# Patient Record
Sex: Female | Born: 1960 | Race: White | Hispanic: No | Smoking: Never smoker
Health system: Southern US, Community
[De-identification: ages and names within clinical notes are randomized; demographics above are authoritative.]

## PROBLEM LIST (undated history)

## (undated) DIAGNOSIS — M810 Age-related osteoporosis without current pathological fracture: Secondary | ICD-10-CM

## (undated) DIAGNOSIS — R7611 Nonspecific reaction to tuberculin skin test without active tuberculosis: Secondary | ICD-10-CM

## (undated) DIAGNOSIS — I2699 Other pulmonary embolism without acute cor pulmonale: Secondary | ICD-10-CM

## (undated) DIAGNOSIS — M419 Scoliosis, unspecified: Secondary | ICD-10-CM

## (undated) DIAGNOSIS — K219 Gastro-esophageal reflux disease without esophagitis: Secondary | ICD-10-CM

## (undated) DIAGNOSIS — I7300 Raynaud's syndrome without gangrene: Secondary | ICD-10-CM

## (undated) DIAGNOSIS — I10 Essential (primary) hypertension: Secondary | ICD-10-CM

## (undated) DIAGNOSIS — F32A Depression, unspecified: Secondary | ICD-10-CM

## (undated) DIAGNOSIS — R32 Unspecified urinary incontinence: Secondary | ICD-10-CM

## (undated) DIAGNOSIS — Q999 Chromosomal abnormality, unspecified: Secondary | ICD-10-CM

## (undated) DIAGNOSIS — Z8719 Personal history of other diseases of the digestive system: Secondary | ICD-10-CM

## (undated) DIAGNOSIS — T7840XA Allergy, unspecified, initial encounter: Secondary | ICD-10-CM

## (undated) DIAGNOSIS — I82409 Acute embolism and thrombosis of unspecified deep veins of unspecified lower extremity: Secondary | ICD-10-CM

## (undated) DIAGNOSIS — E7212 Methylenetetrahydrofolate reductase deficiency: Secondary | ICD-10-CM

## (undated) DIAGNOSIS — M797 Fibromyalgia: Secondary | ICD-10-CM

## (undated) DIAGNOSIS — G4730 Sleep apnea, unspecified: Secondary | ICD-10-CM

## (undated) DIAGNOSIS — F329 Major depressive disorder, single episode, unspecified: Secondary | ICD-10-CM

## (undated) HISTORY — DX: Nonspecific reaction to tuberculin skin test without active tuberculosis: R76.11

## (undated) HISTORY — DX: Gastro-esophageal reflux disease without esophagitis: K21.9

## (undated) HISTORY — DX: Age-related osteoporosis without current pathological fracture: M81.0

## (undated) HISTORY — DX: Sleep apnea, unspecified: G47.30

## (undated) HISTORY — DX: Methylenetetrahydrofolate reductase deficiency: E72.12

## (undated) HISTORY — DX: Essential (primary) hypertension: I10

## (undated) HISTORY — DX: Allergy, unspecified, initial encounter: T78.40XA

## (undated) HISTORY — DX: Personal history of other diseases of the digestive system: Z87.19

## (undated) HISTORY — DX: Unspecified urinary incontinence: R32

## (undated) HISTORY — DX: Scoliosis, unspecified: M41.9

## (undated) HISTORY — DX: Depression, unspecified: F32.A

## (undated) HISTORY — DX: Raynaud's syndrome without gangrene: I73.00

## (undated) HISTORY — DX: Major depressive disorder, single episode, unspecified: F32.9

---

## 1999-03-04 ENCOUNTER — Encounter (INDEPENDENT_AMBULATORY_CARE_PROVIDER_SITE_OTHER): Payer: Self-pay

## 1999-03-04 ENCOUNTER — Other Ambulatory Visit: Admission: RE | Admit: 1999-03-04 | Discharge: 1999-03-04 | Payer: Self-pay | Admitting: Obstetrics and Gynecology

## 1999-06-17 ENCOUNTER — Other Ambulatory Visit: Admission: RE | Admit: 1999-06-17 | Discharge: 1999-06-17 | Payer: Self-pay | Admitting: Obstetrics and Gynecology

## 1999-12-12 ENCOUNTER — Other Ambulatory Visit: Admission: RE | Admit: 1999-12-12 | Discharge: 1999-12-12 | Payer: Self-pay | Admitting: Obstetrics and Gynecology

## 2000-07-10 ENCOUNTER — Other Ambulatory Visit: Admission: RE | Admit: 2000-07-10 | Discharge: 2000-07-10 | Payer: Self-pay | Admitting: Obstetrics and Gynecology

## 2001-02-01 ENCOUNTER — Other Ambulatory Visit: Admission: RE | Admit: 2001-02-01 | Discharge: 2001-02-01 | Payer: Self-pay | Admitting: Obstetrics and Gynecology

## 2001-02-13 ENCOUNTER — Encounter: Payer: Self-pay | Admitting: Obstetrics and Gynecology

## 2001-02-13 ENCOUNTER — Encounter: Admission: RE | Admit: 2001-02-13 | Discharge: 2001-02-13 | Payer: Self-pay | Admitting: Obstetrics and Gynecology

## 2002-04-24 ENCOUNTER — Other Ambulatory Visit: Admission: RE | Admit: 2002-04-24 | Discharge: 2002-04-24 | Payer: Self-pay | Admitting: Obstetrics and Gynecology

## 2003-06-19 ENCOUNTER — Other Ambulatory Visit: Admission: RE | Admit: 2003-06-19 | Discharge: 2003-06-19 | Payer: Self-pay | Admitting: Obstetrics and Gynecology

## 2004-08-17 ENCOUNTER — Other Ambulatory Visit: Admission: RE | Admit: 2004-08-17 | Discharge: 2004-08-17 | Payer: Self-pay | Admitting: Obstetrics and Gynecology

## 2005-09-07 ENCOUNTER — Other Ambulatory Visit: Admission: RE | Admit: 2005-09-07 | Discharge: 2005-09-07 | Payer: Self-pay | Admitting: Obstetrics & Gynecology

## 2005-11-27 ENCOUNTER — Encounter: Admission: RE | Admit: 2005-11-27 | Discharge: 2006-02-25 | Payer: Self-pay

## 2005-11-28 ENCOUNTER — Ambulatory Visit: Admission: RE | Admit: 2005-11-28 | Discharge: 2005-11-28 | Payer: Self-pay | Admitting: Obstetrics & Gynecology

## 2006-01-11 ENCOUNTER — Encounter: Admission: RE | Admit: 2006-01-11 | Discharge: 2006-01-11 | Payer: Self-pay | Admitting: Obstetrics & Gynecology

## 2006-04-16 ENCOUNTER — Encounter: Admission: RE | Admit: 2006-04-16 | Discharge: 2006-04-16 | Payer: Self-pay | Admitting: Obstetrics & Gynecology

## 2006-10-01 ENCOUNTER — Other Ambulatory Visit: Admission: RE | Admit: 2006-10-01 | Discharge: 2006-10-01 | Payer: Self-pay | Admitting: Obstetrics & Gynecology

## 2007-07-11 ENCOUNTER — Encounter: Admission: RE | Admit: 2007-07-11 | Discharge: 2007-07-11 | Payer: Self-pay | Admitting: Obstetrics & Gynecology

## 2007-10-09 ENCOUNTER — Other Ambulatory Visit: Admission: RE | Admit: 2007-10-09 | Discharge: 2007-10-09 | Payer: Self-pay | Admitting: Obstetrics and Gynecology

## 2008-07-09 ENCOUNTER — Encounter: Admission: RE | Admit: 2008-07-09 | Discharge: 2008-07-09 | Payer: Self-pay | Admitting: Family Medicine

## 2010-03-14 ENCOUNTER — Encounter: Admission: RE | Admit: 2010-03-14 | Discharge: 2010-03-14 | Payer: Self-pay | Admitting: Obstetrics & Gynecology

## 2011-11-08 ENCOUNTER — Other Ambulatory Visit (HOSPITAL_COMMUNITY): Payer: Self-pay | Admitting: Nurse Practitioner

## 2011-11-08 DIAGNOSIS — Z1231 Encounter for screening mammogram for malignant neoplasm of breast: Secondary | ICD-10-CM

## 2011-12-01 ENCOUNTER — Ambulatory Visit (HOSPITAL_COMMUNITY)
Admission: RE | Admit: 2011-12-01 | Discharge: 2011-12-01 | Disposition: A | Discharge status: Home / Self Care | Payer: PRIVATE HEALTH INSURANCE | Source: Ambulatory Visit | Attending: Nurse Practitioner | Admitting: Nurse Practitioner

## 2011-12-01 DIAGNOSIS — Z1231 Encounter for screening mammogram for malignant neoplasm of breast: Secondary | ICD-10-CM | POA: Insufficient documentation

## 2013-03-27 ENCOUNTER — Other Ambulatory Visit (HOSPITAL_COMMUNITY): Payer: Self-pay | Admitting: Internal Medicine

## 2013-03-27 DIAGNOSIS — Z1231 Encounter for screening mammogram for malignant neoplasm of breast: Secondary | ICD-10-CM

## 2013-03-28 ENCOUNTER — Ambulatory Visit (HOSPITAL_COMMUNITY)
Admission: RE | Admit: 2013-03-28 | Discharge: 2013-03-28 | Disposition: A | Discharge status: Home / Self Care | Payer: PRIVATE HEALTH INSURANCE | Source: Ambulatory Visit | Attending: Internal Medicine | Admitting: Internal Medicine

## 2013-03-28 DIAGNOSIS — Z1231 Encounter for screening mammogram for malignant neoplasm of breast: Secondary | ICD-10-CM | POA: Insufficient documentation

## 2014-04-24 ENCOUNTER — Ambulatory Visit (INDEPENDENT_AMBULATORY_CARE_PROVIDER_SITE_OTHER): Payer: PRIVATE HEALTH INSURANCE | Admitting: Podiatry

## 2014-04-24 ENCOUNTER — Encounter: Payer: Self-pay | Admitting: Podiatry

## 2014-04-24 VITALS — BP 129/87 | HR 81 | Ht 66.0 in | Wt 150.0 lb

## 2014-04-24 DIAGNOSIS — M7742 Metatarsalgia, left foot: Secondary | ICD-10-CM

## 2014-04-24 DIAGNOSIS — G588 Other specified mononeuropathies: Secondary | ICD-10-CM

## 2014-04-24 DIAGNOSIS — M7741 Metatarsalgia, right foot: Secondary | ICD-10-CM

## 2014-04-24 DIAGNOSIS — M21969 Unspecified acquired deformity of unspecified lower leg: Secondary | ICD-10-CM | POA: Insufficient documentation

## 2014-04-24 DIAGNOSIS — G5731 Lesion of lateral popliteal nerve, right lower limb: Secondary | ICD-10-CM

## 2014-04-24 NOTE — Patient Instructions (Signed)
Seen for pain on both feet. Both feet casted for orthotics. Metatarsal binder dispensed.

## 2014-04-24 NOTE — Progress Notes (Signed)
Subjective: 53 year old female presents complaining of bilateral foot pain that started at dorsal aspect between the first and 2nd digit and extended proximally to first intermetatarsal space right foot 3-4 years ago.  Seen a podiatrist who suggested a series of injections without assurance of success.  Then started to have pain at the first MPJ and IPJ about 2 years ago as well as on the ball of the foot. She saw chiropractor for this problem and got some improvement. Also got orthotics and metatarsal support.  Now pain at the IM space at least couple of times a day, 25-50% of time pain at the IPJ and MPJ. Pain at the ball of the foot is constant.  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   Review of Systems - General ROS: negative for - chills, fatigue, fever or hot flashes Ophthalmic ROS: negative ENT ROS: negative Allergy and Immunology ROS: negative Hematological and Lymphatic ROS: negative Endocrine ROS: negative Breast ROS: negative for breast lumps Respiratory ROS: no cough, shortness of breath, or wheezing Cardiovascular ROS: no chest pain or dyspnea on exertion Gastrointestinal ROS: no abdominal pain, change in bowel habits, or black or bloody stools Genito-Urinary ROS: no dysuria, trouble voiding, or hematuria Musculoskeletal ROS: Gets pain in neck, back, and knee joints.  Neurological ROS: no TIA or stroke symptoms Dermatological ROS: negative   Objective: Dermatologic: Normal skin and nails without abnormal lesions.  Vascular: All pedal pulses are palpable bilateral. No edema or erythema noted. Orthopedic: Positive of excess sagittal plane motion of the first Metatarsal. Enlarged first Metatarsal bone bilateral. Neurologic: All epicritic and tactile sensations grossly intact. Radiographic examination reveal significant elevation of the first Metatarsal bone bilateral. Mild narrowing of joint space at the first MPJ right foot.  Rearfoot and mid foot joints are within  normal.   Assessment: Neuralgia sensory branch of deep peroneal nerve right, possible post injury.  Hypermobile first ray bilateral with weight shifting to lateral column. Osteoarthropathy first MPJ and IPJ bilateral. Lesser metatarsalgia bilateral.  Plan: Reviewed findings and available options. Orthotics inspected. Metatarsal binder dispensed.  Patient wants to try out new pair of orthotics. Both feet casted for orthotics.

## 2014-10-08 ENCOUNTER — Other Ambulatory Visit: Payer: Self-pay | Admitting: Internal Medicine

## 2014-10-08 DIAGNOSIS — N644 Mastodynia: Secondary | ICD-10-CM

## 2014-10-12 ENCOUNTER — Ambulatory Visit
Admission: RE | Admit: 2014-10-12 | Discharge: 2014-10-12 | Disposition: A | Discharge status: Home / Self Care | Payer: PRIVATE HEALTH INSURANCE | Source: Ambulatory Visit | Attending: Internal Medicine | Admitting: Internal Medicine

## 2014-10-12 DIAGNOSIS — N644 Mastodynia: Secondary | ICD-10-CM

## 2014-10-14 ENCOUNTER — Other Ambulatory Visit: Payer: Self-pay | Admitting: Internal Medicine

## 2015-04-16 ENCOUNTER — Other Ambulatory Visit: Payer: Self-pay | Admitting: Chiropractic Medicine

## 2015-04-16 ENCOUNTER — Ambulatory Visit
Admission: RE | Admit: 2015-04-16 | Discharge: 2015-04-16 | Disposition: A | Discharge status: Home / Self Care | Payer: PRIVATE HEALTH INSURANCE | Source: Ambulatory Visit | Attending: Chiropractic Medicine | Admitting: Chiropractic Medicine

## 2015-04-16 DIAGNOSIS — M5385 Other specified dorsopathies, thoracolumbar region: Secondary | ICD-10-CM

## 2015-10-27 ENCOUNTER — Other Ambulatory Visit: Payer: Self-pay | Admitting: Internal Medicine

## 2015-10-27 DIAGNOSIS — M545 Low back pain: Secondary | ICD-10-CM

## 2015-11-04 ENCOUNTER — Other Ambulatory Visit: Payer: PRIVATE HEALTH INSURANCE

## 2015-11-09 ENCOUNTER — Ambulatory Visit
Admission: RE | Admit: 2015-11-09 | Discharge: 2015-11-09 | Disposition: A | Discharge status: Home / Self Care | Payer: PRIVATE HEALTH INSURANCE | Source: Ambulatory Visit | Attending: Internal Medicine | Admitting: Internal Medicine

## 2015-11-09 DIAGNOSIS — M545 Low back pain: Secondary | ICD-10-CM

## 2016-10-16 ENCOUNTER — Other Ambulatory Visit: Payer: Self-pay | Admitting: Neurological Surgery

## 2016-10-16 DIAGNOSIS — G8929 Other chronic pain: Secondary | ICD-10-CM

## 2016-10-16 DIAGNOSIS — M546 Pain in thoracic spine: Principal | ICD-10-CM

## 2016-10-16 DIAGNOSIS — M542 Cervicalgia: Secondary | ICD-10-CM

## 2016-10-21 ENCOUNTER — Ambulatory Visit
Admission: RE | Admit: 2016-10-21 | Discharge: 2016-10-21 | Disposition: A | Discharge status: Home / Self Care | Payer: PRIVATE HEALTH INSURANCE | Source: Ambulatory Visit | Attending: Neurological Surgery | Admitting: Neurological Surgery

## 2016-10-21 DIAGNOSIS — M542 Cervicalgia: Secondary | ICD-10-CM

## 2016-10-21 DIAGNOSIS — M546 Pain in thoracic spine: Principal | ICD-10-CM

## 2016-10-21 DIAGNOSIS — G8929 Other chronic pain: Secondary | ICD-10-CM

## 2016-10-26 ENCOUNTER — Emergency Department (HOSPITAL_COMMUNITY): Payer: PRIVATE HEALTH INSURANCE

## 2016-10-26 ENCOUNTER — Inpatient Hospital Stay (HOSPITAL_COMMUNITY): Payer: PRIVATE HEALTH INSURANCE

## 2016-10-26 ENCOUNTER — Encounter (HOSPITAL_COMMUNITY): Payer: Self-pay

## 2016-10-26 ENCOUNTER — Inpatient Hospital Stay (HOSPITAL_COMMUNITY)
Admission: EM | Admit: 2016-10-26 | Discharge: 2016-10-28 | DRG: 175 | Disposition: A | Discharge status: Home / Self Care | Payer: PRIVATE HEALTH INSURANCE | Attending: Internal Medicine | Admitting: Internal Medicine

## 2016-10-26 DIAGNOSIS — I2609 Other pulmonary embolism with acute cor pulmonale: Secondary | ICD-10-CM | POA: Diagnosis present

## 2016-10-26 DIAGNOSIS — F410 Panic disorder [episodic paroxysmal anxiety] without agoraphobia: Secondary | ICD-10-CM | POA: Diagnosis present

## 2016-10-26 DIAGNOSIS — I10 Essential (primary) hypertension: Secondary | ICD-10-CM | POA: Diagnosis present

## 2016-10-26 DIAGNOSIS — G8929 Other chronic pain: Secondary | ICD-10-CM | POA: Diagnosis present

## 2016-10-26 DIAGNOSIS — Z91040 Latex allergy status: Secondary | ICD-10-CM | POA: Diagnosis not present

## 2016-10-26 DIAGNOSIS — Z79899 Other long term (current) drug therapy: Secondary | ICD-10-CM | POA: Diagnosis not present

## 2016-10-26 DIAGNOSIS — I2699 Other pulmonary embolism without acute cor pulmonale: Secondary | ICD-10-CM | POA: Diagnosis not present

## 2016-10-26 DIAGNOSIS — R0902 Hypoxemia: Secondary | ICD-10-CM | POA: Diagnosis present

## 2016-10-26 DIAGNOSIS — M549 Dorsalgia, unspecified: Secondary | ICD-10-CM | POA: Diagnosis present

## 2016-10-26 DIAGNOSIS — M5440 Lumbago with sciatica, unspecified side: Secondary | ICD-10-CM | POA: Diagnosis not present

## 2016-10-26 DIAGNOSIS — K219 Gastro-esophageal reflux disease without esophagitis: Secondary | ICD-10-CM | POA: Diagnosis present

## 2016-10-26 DIAGNOSIS — R609 Edema, unspecified: Secondary | ICD-10-CM | POA: Diagnosis not present

## 2016-10-26 DIAGNOSIS — Z8249 Family history of ischemic heart disease and other diseases of the circulatory system: Secondary | ICD-10-CM

## 2016-10-26 DIAGNOSIS — Z7952 Long term (current) use of systemic steroids: Secondary | ICD-10-CM | POA: Diagnosis not present

## 2016-10-26 DIAGNOSIS — E559 Vitamin D deficiency, unspecified: Secondary | ICD-10-CM | POA: Diagnosis present

## 2016-10-26 DIAGNOSIS — F419 Anxiety disorder, unspecified: Secondary | ICD-10-CM | POA: Diagnosis not present

## 2016-10-26 DIAGNOSIS — I7300 Raynaud's syndrome without gangrene: Secondary | ICD-10-CM | POA: Diagnosis present

## 2016-10-26 DIAGNOSIS — M797 Fibromyalgia: Secondary | ICD-10-CM | POA: Diagnosis present

## 2016-10-26 DIAGNOSIS — E7212 Methylenetetrahydrofolate reductase deficiency: Secondary | ICD-10-CM | POA: Diagnosis present

## 2016-10-26 DIAGNOSIS — Z888 Allergy status to other drugs, medicaments and biological substances status: Secondary | ICD-10-CM

## 2016-10-26 DIAGNOSIS — F329 Major depressive disorder, single episode, unspecified: Secondary | ICD-10-CM | POA: Diagnosis present

## 2016-10-26 DIAGNOSIS — I5030 Unspecified diastolic (congestive) heart failure: Secondary | ICD-10-CM | POA: Diagnosis not present

## 2016-10-26 DIAGNOSIS — Z91030 Bee allergy status: Secondary | ICD-10-CM

## 2016-10-26 DIAGNOSIS — Z880 Allergy status to penicillin: Secondary | ICD-10-CM | POA: Diagnosis not present

## 2016-10-26 HISTORY — DX: Chromosomal abnormality, unspecified: Q99.9

## 2016-10-26 HISTORY — DX: Fibromyalgia: M79.7

## 2016-10-26 LAB — BASIC METABOLIC PANEL
ANION GAP: 11 (ref 5–15)
BUN: 17 mg/dL (ref 6–20)
CHLORIDE: 101 mmol/L (ref 101–111)
CO2: 24 mmol/L (ref 22–32)
Calcium: 9.4 mg/dL (ref 8.9–10.3)
Creatinine, Ser: 0.86 mg/dL (ref 0.44–1.00)
GFR calc Af Amer: >60 mL/min (ref >60)
GFR calc non Af Amer: >60 mL/min (ref >60)
Glucose, Bld: 133 mg/dL — ABNORMAL HIGH (ref 65–99)
POTASSIUM: 4.2 mmol/L (ref 3.5–5.1)
Sodium: 136 mmol/L (ref 135–145)

## 2016-10-26 LAB — CBC WITH DIFFERENTIAL/PLATELET
Basophils Absolute: 0 10*3/uL (ref 0.0–0.1)
Basophils Relative: 0 %
Eosinophils Absolute: 0 10*3/uL (ref 0.0–0.7)
Eosinophils Relative: 0 %
HEMATOCRIT: 37.8 % (ref 36.0–46.0)
HEMOGLOBIN: 13 g/dL (ref 12.0–15.0)
LYMPHS ABS: 1.6 10*3/uL (ref 0.7–4.0)
LYMPHS PCT: 10 %
MCH: 33.2 pg (ref 26.0–34.0)
MCHC: 34.4 g/dL (ref 30.0–36.0)
MCV: 96.4 fL (ref 78.0–100.0)
MONOS PCT: 5 %
Monocytes Absolute: 0.7 10*3/uL (ref 0.1–1.0)
NEUTROS ABS: 12.8 10*3/uL — AB (ref 1.7–7.7)
NEUTROS PCT: 85 %
Platelets: 223 10*3/uL (ref 150–400)
RBC: 3.92 MIL/uL (ref 3.87–5.11)
RDW: 14.3 % (ref 11.5–15.5)
WBC: 15.2 10*3/uL — ABNORMAL HIGH (ref 4.0–10.5)

## 2016-10-26 LAB — I-STAT TROPONIN, ED: Troponin i, poc: 0.17 ng/mL (ref 0.00–0.08)

## 2016-10-26 LAB — URINALYSIS, ROUTINE W REFLEX MICROSCOPIC
Bilirubin Urine: NEGATIVE
Glucose, UA: NEGATIVE mg/dL
HGB URINE DIPSTICK: NEGATIVE
Ketones, ur: NEGATIVE mg/dL
LEUKOCYTES UA: NEGATIVE
Nitrite: NEGATIVE
PROTEIN: NEGATIVE mg/dL
SPECIFIC GRAVITY, URINE: 1.005 (ref 1.005–1.030)
pH: 7 (ref 5.0–8.0)

## 2016-10-26 LAB — RAPID URINE DRUG SCREEN, HOSP PERFORMED
AMPHETAMINES: NOT DETECTED
BENZODIAZEPINES: NOT DETECTED
Barbiturates: NOT DETECTED
Cocaine: NOT DETECTED
OPIATES: NOT DETECTED
TETRAHYDROCANNABINOL: NOT DETECTED

## 2016-10-26 LAB — HEPARIN LEVEL (UNFRACTIONATED): Heparin Unfractionated: 1 IU/mL — ABNORMAL HIGH (ref 0.30–0.70)

## 2016-10-26 LAB — PROTIME-INR
INR: 0.94
Prothrombin Time: 12.5 seconds (ref 11.4–15.2)

## 2016-10-26 LAB — TSH: TSH: 2.276 u[IU]/mL (ref 0.350–4.500)

## 2016-10-26 LAB — D-DIMER, QUANTITATIVE: D-Dimer, Quant: 5.97 ug/mL-FEU — ABNORMAL HIGH (ref 0.00–0.50)

## 2016-10-26 LAB — APTT: APTT: 24 s (ref 24–36)

## 2016-10-26 LAB — ECHOCARDIOGRAM COMPLETE
HEIGHTINCHES: 66 in
Weight: 2000 oz

## 2016-10-26 LAB — CK: Total CK: 59 U/L (ref 38–234)

## 2016-10-26 MED ORDER — CLONAZEPAM 0.5 MG PO TABS
0.5000 mg | ORAL_TABLET | Freq: Every day | ORAL | Status: DC
  Administered 2016-10-26 – 2016-10-27 (×2): 0.5 mg via ORAL
  Filled 2016-10-26 (×2): qty 1

## 2016-10-26 MED ORDER — ACETAMINOPHEN 650 MG RE SUPP: 650.0000 mg | Freq: Four times a day (QID) | RECTAL | Status: DC | PRN

## 2016-10-26 MED ORDER — IOPAMIDOL (ISOVUE-370) INJECTION 76%
INTRAVENOUS | Status: AC
  Administered 2016-10-26: 100 mL via INTRAVENOUS
  Filled 2016-10-26: qty 100

## 2016-10-26 MED ORDER — L-METHYLFOLATE 15 MG PO TABS: 1.0000 | ORAL_TABLET | Freq: Every day | ORAL | Status: DC

## 2016-10-26 MED ORDER — ACETAMINOPHEN 325 MG PO TABS
650.0000 mg | ORAL_TABLET | Freq: Four times a day (QID) | ORAL | Status: DC | PRN
Start: 2016-10-26 — End: 2016-10-28
  Administered 2016-10-27 – 2016-10-28 (×3): 650 mg via ORAL
  Filled 2016-10-26 (×3): qty 2

## 2016-10-26 MED ORDER — ONDANSETRON HCL 4 MG/2ML IJ SOLN: 4.0000 mg | Freq: Four times a day (QID) | INTRAMUSCULAR | Status: DC | PRN

## 2016-10-26 MED ORDER — FAMOTIDINE 20 MG PO TABS
20.0000 mg | ORAL_TABLET | Freq: Every day | ORAL | Status: DC
  Administered 2016-10-27 – 2016-10-28 (×2): 20 mg via ORAL
  Filled 2016-10-26 (×2): qty 1

## 2016-10-26 MED ORDER — VITAMIN D3 25 MCG (1000 UNIT) PO TABS
5000.0000 [IU] | ORAL_TABLET | Freq: Every day | ORAL | Status: DC
  Administered 2016-10-27 – 2016-10-28 (×2): 5000 [IU] via ORAL
  Filled 2016-10-26 (×2): qty 5

## 2016-10-26 MED ORDER — HYDRALAZINE HCL 20 MG/ML IJ SOLN
5.0000 mg | INTRAMUSCULAR | Status: DC | PRN
  Administered 2016-10-26: 5 mg via INTRAVENOUS
  Filled 2016-10-26: qty 1

## 2016-10-26 MED ORDER — VITAMIN C 500 MG PO TABS
500.0000 mg | ORAL_TABLET | Freq: Every day | ORAL | Status: DC
  Administered 2016-10-27 – 2016-10-28 (×2): 500 mg via ORAL
  Filled 2016-10-26 (×2): qty 1

## 2016-10-26 MED ORDER — HEPARIN BOLUS VIA INFUSION
3500.0000 [IU] | Freq: Once | INTRAVENOUS | Status: AC
  Administered 2016-10-26: 3500 [IU] via INTRAVENOUS
  Filled 2016-10-26: qty 3500

## 2016-10-26 MED ORDER — IOPAMIDOL (ISOVUE-370) INJECTION 76%
100.0000 mL | Freq: Once | INTRAVENOUS | Status: AC | PRN
  Administered 2016-10-26: 100 mL via INTRAVENOUS

## 2016-10-26 MED ORDER — BUSPIRONE HCL 5 MG PO TABS
30.0000 mg | ORAL_TABLET | Freq: Two times a day (BID) | ORAL | Status: DC
  Administered 2016-10-26 – 2016-10-28 (×4): 30 mg via ORAL
  Filled 2016-10-26 (×4): qty 6

## 2016-10-26 MED ORDER — HEPARIN (PORCINE) IN NACL 100-0.45 UNIT/ML-% IJ SOLN: 800.0000 [IU]/h | INTRAMUSCULAR | Status: AC

## 2016-10-26 MED ORDER — SODIUM CHLORIDE 0.9 % IV SOLN: 250.0000 mL | INTRAVENOUS | Status: DC | PRN

## 2016-10-26 MED ORDER — SODIUM CHLORIDE 0.9% FLUSH
3.0000 mL | Freq: Two times a day (BID) | INTRAVENOUS | Status: DC
  Administered 2016-10-26 – 2016-10-27 (×3): 3 mL via INTRAVENOUS

## 2016-10-26 MED ORDER — L-METHYLFOLATE-B6-B12 3-35-2 MG PO TABS
1.0000 | ORAL_TABLET | Freq: Every day | ORAL | Status: DC
  Administered 2016-10-27 – 2016-10-28 (×2): 1 via ORAL
  Filled 2016-10-26 (×2): qty 1

## 2016-10-26 MED ORDER — METHOCARBAMOL 500 MG PO TABS
1000.0000 mg | ORAL_TABLET | Freq: Every day | ORAL | Status: DC
  Filled 2016-10-26: qty 2

## 2016-10-26 MED ORDER — TRAMADOL HCL 50 MG PO TABS
50.0000 mg | ORAL_TABLET | Freq: Four times a day (QID) | ORAL | Status: DC | PRN
  Administered 2016-10-26 – 2016-10-27 (×2): 50 mg via ORAL
  Filled 2016-10-26 (×2): qty 1

## 2016-10-26 MED ORDER — ALBUTEROL SULFATE (2.5 MG/3ML) 0.083% IN NEBU: 2.5000 mg | INHALATION_SOLUTION | RESPIRATORY_TRACT | Status: DC | PRN

## 2016-10-26 MED ORDER — SODIUM CHLORIDE 0.9% FLUSH: 3.0000 mL | INTRAVENOUS | Status: DC | PRN

## 2016-10-26 MED ORDER — HEPARIN (PORCINE) IN NACL 100-0.45 UNIT/ML-% IJ SOLN
950.0000 [IU]/h | INTRAMUSCULAR | Status: DC
  Administered 2016-10-26: 950 [IU]/h via INTRAVENOUS
  Filled 2016-10-26: qty 250

## 2016-10-26 MED ORDER — ONDANSETRON HCL 4 MG PO TABS: 4.0000 mg | ORAL_TABLET | Freq: Four times a day (QID) | ORAL | Status: DC | PRN

## 2016-10-26 MED ORDER — SODIUM CHLORIDE 0.9% FLUSH: 3.0000 mL | Freq: Two times a day (BID) | INTRAVENOUS | Status: DC

## 2016-10-26 MED ORDER — TRAZODONE HCL 100 MG PO TABS
100.0000 mg | ORAL_TABLET | Freq: Every evening | ORAL | Status: DC | PRN
  Administered 2016-10-27: 100 mg via ORAL
  Filled 2016-10-26: qty 1

## 2016-10-26 MED ORDER — VORTIOXETINE HBR 10 MG PO TABS
1.0000 | ORAL_TABLET | Freq: Every day | ORAL | Status: DC
  Administered 2016-10-27: 10 mg via ORAL
  Filled 2016-10-26 (×2): qty 1

## 2016-10-26 MED ORDER — PREDNISONE 20 MG PO TABS
40.0000 mg | ORAL_TABLET | Freq: Every day | ORAL | Status: DC
Start: 2016-10-27 — End: 2016-10-28
  Administered 2016-10-27 – 2016-10-28 (×2): 40 mg via ORAL
  Filled 2016-10-26 (×2): qty 2

## 2016-10-26 NOTE — Progress Notes (Signed)
  Echocardiogram 2D Echocardiogram has been performed.  Arvil ChacoFoster, Theran Vandergrift 10/26/2016, 3:33 PM

## 2016-10-26 NOTE — Progress Notes (Signed)
ANTICOAGULATION CONSULT NOTE - Follow Up Consult  Pharmacy Consult for Heparin Indication: pulmonary embolus and DVT  Allergies  Allergen Reactions  . Bee Venom   . Citalopram Tinitus  . Duloxetine Other (See Comments)  . Escitalopram Oxalate Tinitus  . Latex   . Penicillins Swelling and Rash    Has patient had a PCN reaction causing immediate rash, facial/tongue/throat swelling, SOB or lightheadedness with hypotension: Yes Has patient had a PCN reaction causing severe rash involving mucus membranes or skin necrosis: No Has patient had a PCN reaction that required hospitalization: No Has patient had a PCN reaction occurring within the last 10 years: No If all of the above answers are "NO", then may proceed with Cephalosporin use.    Patient Measurements: Height: 5\' 6"  (167.6 cm) Weight: 125 lb (56.7 kg) IBW/kg (Calculated) : 59.3 Heparin Dosing Weight: actual weight, 56.7 kg  Vital Signs: Temp: 98.2 F (36.8 C) (05/24 1214) Temp Source: Oral (05/24 1214) BP: 133/89 (05/24 1214) Pulse Rate: 106 (05/24 1214)  Labs:  Recent Labs  10/26/16 0745 10/26/16 1232  HGB 13.0  --   HCT 37.8  --   PLT 223  --   APTT 24  --   LABPROT 12.5  --   INR 0.94  --   CREATININE 0.86  --   CKTOTAL  --  59    Estimated Creatinine Clearance: 67.7 mL/min (by C-G formula based on SCr of 0.86 mg/dL).   Medications:  Infusions:  . sodium chloride    . heparin 950 Units/hr (10/26/16 1053)    Assessment: [age] yoF presented to ED on 5/24 with worsening exertional dyspnea.  Family history of thrombotic events in mother, father, and brother.  She denies recent travel, immobilization, surgery, or hormonal therapy.  CT showed submassive PE and venous duplex is positive for right peroneal DVT and left posterior tibial DVT.  No intervention planned at this time.  Pharmacy is consulted to dose Heparin IV.  First Heparin level = 1, supratherapeutic Currently Heparin is infusing via right  antecubital peripheral IV and heparin level was drawn from left side. No bleeding/complications reported.   Goal of Therapy:  Heparin level 0.3-0.7 units/ml Monitor platelets by anticoagulation protocol: Yes   Plan:  Hold heparin x1 hour Decrease to heparin IV infusion at 800 units/hr  Heparin level 6 hours after rate change. Daily heparin level and CBC Continue to monitor H&H and platelets   Lynann Beaverhristine Unnamed Hino PharmD, BCPS Pager [phone] 10/26/2016 6:16 PM

## 2016-10-26 NOTE — H&P (Addendum)
HISTORY AND PHYSICAL       PATIENT DETAILS Name: Faith Thomas Age: 56 y.o. Sex: female Date of Birth: 01-16-1961 Admit Date: 10/26/2016 ZOX:WRUEAVW, Cala Bradford, MD   Patient coming from: Home   CHIEF COMPLAINT:  Worsening exertional dyspnea  HPI: Faith Thomas is a 56 y.o. female with medical history significant of fibromyalgia, depression/anxiety, Raynaud's phenomenon presented to the hospital for evaluation of worsening exertional dyspnea that started approximately 3-4 days back. Per patient, she noticed that she was getting tired very easily for the past week or so, but for the past 3-4 days she has had exertional shortness of breath upon walking. Over the past day or so, even doing simple household chores make her short of breath. She had some jaw pain this morning, as a result she presented to the ED for further evaluation, a CT scan of the chest showed submassive pulmonary embolism. The hospitalist service was then asked to admit this patient for further evaluation and treatment  Per patient, there is no recent history of air travel, prolonged immobilization or surgery. She denies taking any hormonal supplements, however she is on numerous over-the-counter vitamins and other supplementations. She does not have a prior history of venous thromboembolism, but claims that her father, mother and brother have had numerous venous thromboembolism in the past.  He denies any chest pain to me, there is no history of nausea, vomiting or diarrhea. There is no history of fever.   ED Course:  CT angiogram chest positive for submassive PE-case was discussed with Dr. Marchelle Gearing over the phone, who recommended [facility] admission for anticoagulation. Patient remained hemodynamically stable and is not even on oxygen.  Note: Lives at: Home Mobility: Independent Chronic Indwelling Foley:no   REVIEW OF SYSTEMS:  Constitutional:   No  weight loss, night sweats,  Fevers,  chills, fatigue.  HEENT:    No headaches, Dysphagia,Tooth/dental problems,Sore throat  Cardio-vascular: No chest pain,Orthopnea, PND,lower extremity edema, anasarca, palpitations  GI:  No heartburn, indigestion, abdominal pain, nausea, vomiting, diarrhea, melena or hematochezia  Resp: No  cough, hemoptysis,plueritic chest pain.   Skin:  No rash or lesions.  GU:  No dysuria, change in color of urine, no urgency or frequency.  No flank pain.  Musculoskeletal: No joint pain or swelling.  No decreased range of motion.  No back pain.  Endocrine: No heat intolerance, no cold intolerance, no polyuria, no polydipsia  Psych: No change in mood or affect. No depression or anxiety.  No memory loss.   ALLERGIES:   Allergies  Allergen Reactions  . Bee Venom   . Citalopram Tinitus  . Duloxetine Other (See Comments)  . Escitalopram Oxalate Tinitus  . Latex   . Penicillins Swelling and Rash    Has patient had a PCN reaction causing immediate rash, facial/tongue/throat swelling, SOB or lightheadedness with hypotension: Yes Has patient had a PCN reaction causing severe rash involving mucus membranes or skin necrosis: No Has patient had a PCN reaction that required hospitalization: No Has patient had a PCN reaction occurring within the last 10 years: No If all of the above answers are "NO", then may proceed with Cephalosporin use.    PAST MEDICAL HISTORY: Past Medical History:  Diagnosis Date  . Fibromyalgia   . Genetic defect     PAST SURGICAL HISTORY: History reviewed. No pertinent surgical history.  MEDICATIONS AT HOME: Prior to Admission medications   Medication Sig Start Date End Date Taking? Authorizing Provider  ALPRAZolam (XANAX) 0.5 MG tablet Take 0.5 mg by mouth at bedtime as needed for anxiety.   Yes [provider]  b complex vitamins tablet Take 1 tablet by mouth daily.   Yes [provider]  busPIRone (BUSPAR) 30 MG tablet Take 30 mg by mouth  2 (two) times daily.   Yes [provider]  Cholecalciferol (VITAMIN D-3) 5000 UNITS TABS Take by mouth daily.   Yes [provider]  Chromium 400 MCG TABS Take 400 mcg by mouth 2 (two) times daily.   Yes [provider]  clonazePAM (KLONOPIN) 0.5 MG tablet Take 0.5 mg by mouth at bedtime.   Yes [provider]  erythromycin ophthalmic ointment Place 1 application into both eyes 3 (three) times daily. 09/07/16  Yes [provider]  L-Methylfolate 15 MG TABS Take 1 tablet by mouth daily. 10/23/16  Yes [provider]  L-Theanine 100 MG CAPS Take 200 mg by mouth at bedtime.   Yes [provider]  LACTOBACILLUS BIFIDUS PO Take 1 capsule by mouth 3 (three) times a week.   Yes [provider]  lisinopril (PRINIVIL,ZESTRIL) 5 MG tablet Take 2.5 mg by mouth daily.    Yes [provider]  methocarbamol (ROBAXIN) 500 MG tablet Take 1,000 mg by mouth daily. 09/01/16  Yes [provider]  naproxen sodium (ANAPROX) 220 MG tablet Take 440-660 mg by mouth 2 (two) times daily with a meal.   Yes [provider]  Omega 3 1200 MG CAPS Take 1,200 mg by mouth daily.   Yes [provider]  predniSONE (DELTASONE) 20 MG tablet Take 20 mg by mouth 2 (two) times daily with a meal.   Yes [provider]  ranitidine (ZANTAC) 150 MG tablet Take 150 mg by mouth daily. 09/29/16  Yes [provider]  S-Adenosylmethionine (SAM-E) 400 MG TABS Take 400 mg by mouth daily.   Yes [provider]  traMADol (ULTRAM) 50 MG tablet Take 50 mg by mouth 4 (four) times daily as needed for pain. 10/08/16  Yes [provider]  traZODone (DESYREL) 50 MG tablet Take 100 mg by mouth at bedtime as needed for sleep. 10/19/16  Yes [provider]  Turmeric (CURCUMIN 95 PO) Take 1,200 mg by mouth at bedtime.   Yes [provider]  vitamin C (ASCORBIC ACID) 500 MG tablet Take 500 mg by mouth daily.    Yes [provider]  vortioxetine HBr (TRINTELLIX) 10 MG TABS Take 1 tablet by mouth daily.   Yes [provider]    FAMILY HISTORY: Father/mother/brother-venous thromboembolism  SOCIAL HISTORY:  reports that she has never smoked. She has never used smokeless tobacco. She reports that she does not drink alcohol or use drugs.  PHYSICAL EXAM: Blood pressure (!) 130/102, pulse 93, temperature 97.9 F (36.6 C), temperature source Oral, resp. rate 15, height 5\' 6"  (1.676 m), weight 56.7 kg (125 lb), SpO2 93 %.  General appearance :Awake, alert, not in any distress. Speech Clear. Not toxic Looking Eyes:, pupils equally reactive to light and accomodation,no scleral icterus.Pink conjunctiva HEENT: Atraumatic and Normocephalic Neck: supple, no JVD. No cervical lymphadenopathy.  Resp:Good air entry bilaterally, no added sounds  CVS: S1 S2 regular, no murmurs.  GI: Bowel sounds present, Non tender and not distended with no gaurding, rigidity or rebound.No organomegaly Extremities: B/L Lower Ext shows no edema, both legs are warm to touch Neurology:  speech clear,Non focal, sensation is grossly intact.Deep tendon reflexes are preserved Psychiatric:  Normal judgment and insight. Alert and oriented x 3. Normal mood. Musculoskeletal:No digital cyanosis Skin:No Rash, warm and dry Wounds:N/A  LABS ON ADMISSION:  I have personally reviewed following labs and imaging studies  CBC:  Recent Labs Lab 10/26/16 0745  WBC 15.2*  NEUTROABS 12.8*  HGB 13.0  HCT 37.8  MCV 96.4  PLT 223    Basic Metabolic Panel:  Recent Labs Lab 10/26/16 0745  NA 136  K 4.2  CL 101  CO2 24  GLUCOSE 133*  BUN 17  CREATININE 0.86  CALCIUM 9.4    GFR: Estimated Creatinine Clearance: 67.7 mL/min (by C-G formula based on SCr of 0.86 mg/dL).  Liver Function Tests: No results for input(s): AST, ALT, ALKPHOS, BILITOT, PROT, ALBUMIN in the last 168 hours. No results for input(s): LIPASE,  AMYLASE in the last 168 hours. No results for input(s): AMMONIA in the last 168 hours.  Coagulation Profile:  Recent Labs Lab 10/26/16 0745  INR 0.94    Cardiac Enzymes: No results for input(s): CKTOTAL, CKMB, CKMBINDEX, TROPONINI in the last 168 hours.  BNP (last 3 results) No results for input(s): PROBNP in the last 8760 hours.  HbA1C: No results for input(s): HGBA1C in the last 72 hours.  CBG: No results for input(s): GLUCAP in the last 168 hours.  Lipid Profile: No results for input(s): CHOL, HDL, LDLCALC, TRIG, CHOLHDL, LDLDIRECT in the last 72 hours.  Thyroid Function Tests:  Recent Labs  10/26/16 0746  TSH 2.276    Anemia Panel: No results for input(s): VITAMINB12, FOLATE, FERRITIN, TIBC, IRON, RETICCTPCT in the last 72 hours.  Urine analysis:    Component Value Date/Time   COLORURINE YELLOW 10/26/2016 0840   APPEARANCEUR CLEAR 10/26/2016 0840   LABSPEC 1.005 10/26/2016 0840   PHURINE 7.0 10/26/2016 0840   GLUCOSEU NEGATIVE 10/26/2016 0840   HGBUR NEGATIVE 10/26/2016 0840   BILIRUBINUR NEGATIVE 10/26/2016 0840   KETONESUR NEGATIVE 10/26/2016 0840   PROTEINUR NEGATIVE 10/26/2016 0840   NITRITE NEGATIVE 10/26/2016 0840   LEUKOCYTESUR NEGATIVE 10/26/2016 0840    Sepsis Labs: Lactic Acid, Venous No results found for: LATICACIDVEN   Microbiology: No results found for this or any previous visit (from the past 240 hour(s)).    RADIOLOGIC STUDIES ON ADMISSION: Dg Chest 2 View  Result Date: 10/26/2016 CLINICAL DATA:  Shortness of breath for 2 days EXAM: CHEST  2 VIEW COMPARISON:  None. FINDINGS: The heart size and mediastinal contours are within normal limits. Both lungs are clear. The visualized skeletal structures are unremarkable. IMPRESSION: No active cardiopulmonary disease. Electronically Signed   By: Alcide CleverMark  Lukens M.D.   On: 10/26/2016 07:39   Ct Angio Chest Pe W/cm &/or Wo Cm  Result Date: 10/26/2016 CLINICAL DATA:  Shortness of breath. EXAM:  CT ANGIOGRAPHY CHEST WITH CONTRAST TECHNIQUE: Multidetector CT imaging of the chest was performed using the standard protocol during bolus administration of intravenous contrast. Multiplanar CT image reconstructions and MIPs were obtained to evaluate the vascular anatomy. CONTRAST:  Isovue 370. Contrast dose not documented by technologist. COMPARISON:  Oct 26, 2016 FINDINGS: Cardiovascular: There is a large volume of pulmonary embolus involving all lobes. Numerous segmental branches are involved. There is mild extension into the right main pulmonary artery. No saddle embolus. The thoracic aorta is normal in caliber with no identified dissection. The heart is normal in size. The right ventricle diameter is 4 cm and the left is 3.6 cm with a RV/LV ratio of 1.11 Mediastinum/Nodes: No enlarged mediastinal, hilar, or axillary  lymph nodes. Thyroid gland, trachea, and esophagus demonstrate no significant findings. Lungs/Pleura: Central airways are normal. No pneumothorax. No pulmonary nodules, masses, or pulmonary infiltrates. Upper Abdomen: No acute abnormality. Musculoskeletal: No chest wall abnormality. No acute or significant osseous findings. Review of the MIP images confirms the above findings. IMPRESSION: 1. Large pulmonary embolus burden affecting all lobes with an RV/LV ratio of 1.11 consistent with right heart strain. Positive for acute PE with CT evidence of right heart strain (RV/LV Ratio = 1.11) consistent with at least submassive (intermediate risk) PE. The presence of right heart strain has been associated with an increased risk of morbidity and mortality. Please activate Code PE by paging [phone]. Findings discussed with Dr. Donnald Garre. Electronically Signed   By: Gerome Sam III M.D   On: 10/26/2016 09:42    I have personally reviewed images of chest xray or the CT  chest-positive for PE  EKG:  Personally reviewed. sinus tachycardia  ASSESSMENT AND PLAN: Submassive pulmonary embolism:  Although she has a significant blood burden seen on CT chest, she appears remarkably well. She lying comfortably in bed and does not even appear to be dyspneic when she is speaking to me. She is hemodynamically stable, and is not even on oxygen. Continue IV heparin, check lower eczema Dopplers and echocardiogram. She has a strong family history of VTE-and probably will require outpatient hematology evaluation in around 4-5 months time for further workup including hypercoagulable workup. However her pulmonary embolism sounds like it is unprovoked, and probably will require indefinite anticoagulation. She has been advised to make sure she is up-to-date with all recommended age-appropriate cancer screening, she has been asked to make sure she follows up with the PCP and get a hematology referral. Hopefully if she continues to be stable, she can be transitioned to oral anticoagulants in the next few days, and be discharged home.  Addendum 4:30 PM Echocardiogram shows severe RV dysfunction-spoke with RN-continues to be on room air with stable hemodynamics, subsequently discussed case with PCCM -Dr. Mannam-recommendations are to continue with supportive care and IV heparin as she is stable, and to continue to monitor closely.  Fibromyalgia/back pain: Placed on steroids by her PCP for approximately 1 month, per patient, when this was tapered-she had panic attacks and worsening pain. Continue prednisone and as needed tramadol for now  Anxiety/depression: Continue her usual medications. This appears to be relatively stable at this point.  Note-patient was recently seen by her chiropractor and was told she probably had rhabdomyolysis-she has no clinical features consistent with rhabdomyolysis at this time-however at her request we will check a CK  Further plan will depend as patient's clinical course evolves and further radiologic and laboratory data become available. Patient will be monitored closely.  Above  noted plan was discussed with patient/ face to face at bedside, she was in agreement.   CONSULTS: None  DVT Prophylaxis: IV Heparin  Code Status: Full Code  Disposition Plan:  Discharge back home  possibly in 2-3 days, depending on clinical course  Admission status: Inpatient going to tele  Total time spent  55 minutes.Greater than 50% of this time was spent in counseling, explanation of diagnosis, planning of further management, and coordination of care.  Jeoffrey Massed [facility] Pager [phone]  If 7PM-7AM, please contact night-coverage [email or web address] Password [facility] 10/26/2016, 11:34 AM

## 2016-10-26 NOTE — ED Triage Notes (Signed)
Pt complains of being short of breath for two days and this morning her jaw started aching, she states that her family has a hx of blood clots and was concerned when her jaw started to hurt.  Pt states she has been seeing several specialists recently for different dx

## 2016-10-26 NOTE — Consult Note (Signed)
Name: GRACE VALLEY MRN: [id number] DOB: [birth year]    ADMISSION DATE:  10/26/2016 CONSULTATION DATE:  10/26/2016  REFERRING MD :  Dr. Jerral Ralph  CHIEF COMPLAINT:  Submassive PE  HISTORY OF PRESENT ILLNESS:   56 year old female with PMH significant for never smoker, fibromyalgia, anxiety, depression, Raynaud's phenomenon, GERD, vitamin D deficiency, insomnia, newly diagnosed HTN (not on meds, watching per [facility] records), and patient reported IBS/ constipation admitted on 5/24 with submassive pulmonary embolism.  She reports progressive exertional dyspnea over the last week, worse in the last two days with development of jaw pain.  Additionally, she had some pain in her legs last week, but since that has resolved.  Patient reports a very strong family history of DVT and PE in both parents and brother; therefore, she was concerned and came to the hospital.  In the ER, labs showed D-dimer 5.97, troponin 0.17, WBC 15.2. EKG with sinus tachycardia.   Chest CT showed a submassive PE with RV/LV ratio of 1.11 consistent with right heart strain.  Hemodynamically, she was stable with normal oxygen saturations on room air.  She was started on heparin drip and admitted to the hospitalists service.    She denies any chest pain, syncope, fever, recent prolonged travel, immobilization, hormonal therapy, weight loss, change or blood in stool, night sweats, or previous venous thromboembolism.  Upon chart review, no previous autoimmune workup noted.  She takes multiple OTC vitamins and supplements as she likes to treat her medical issues naturally and tries to limit medication.   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   PCCM to consult for submassive PE.  PAST MEDICAL HISTORY :   has a past medical history of Fibromyalgia and Genetic defect.  has no past surgical history on file. Prior to Admission medications   Medication Sig Start Date End Date Taking? Authorizing  Provider  ALPRAZolam Prudy Feeler) 0.5 MG tablet Take 0.5 mg by mouth at bedtime as needed for anxiety.   Yes [provider]  b complex vitamins tablet Take 1 tablet by mouth daily.   Yes [provider]  busPIRone (BUSPAR) 30 MG tablet Take 30 mg by mouth 2 (two) times daily.   Yes [provider]  Cholecalciferol (VITAMIN D-3) 5000 UNITS TABS Take by mouth daily.   Yes [provider]  Chromium 400 MCG TABS Take 400 mcg by mouth 2 (two) times daily.   Yes [provider]  clonazePAM (KLONOPIN) 0.5 MG tablet Take 0.5 mg by mouth at bedtime.   Yes [provider]  erythromycin ophthalmic ointment Place 1 application into both eyes 3 (three) times daily. 09/07/16  Yes [provider]  L-Methylfolate 15 MG TABS Take 1 tablet by mouth daily. 10/23/16  Yes [provider]  L-Theanine 100 MG CAPS Take 200 mg by mouth at bedtime.   Yes [provider]  LACTOBACILLUS BIFIDUS PO Take 1 capsule by mouth 3 (three) times a week.   Yes [provider]  lisinopril (PRINIVIL,ZESTRIL) 5 MG tablet Take 2.5 mg by mouth daily.    Yes [provider]  methocarbamol (ROBAXIN) 500 MG tablet Take 1,000 mg by mouth daily. 09/01/16  Yes [provider]  naproxen sodium (ANAPROX) 220 MG tablet Take 440-660 mg by mouth 2 (two) times daily with a meal.   Yes [provider]  Omega 3 1200 MG CAPS Take 1,200 mg by mouth daily.   Yes [provider]  predniSONE (DELTASONE) 20 MG tablet Take 20 mg by mouth 2 (two) times daily with a meal.   Yes [provider]  ranitidine (ZANTAC) 150 MG tablet Take 150 mg by mouth daily. 09/29/16  Yes [provider]  S-Adenosylmethionine (SAM-E) 400 MG TABS Take 400 mg by mouth daily.   Yes [provider]  traMADol (ULTRAM) 50 MG tablet Take 50 mg by mouth 4 (four) times daily as needed for pain. 10/08/16  Yes [provider]  traZODone  (DESYREL) 50 MG tablet Take 100 mg by mouth at bedtime as needed for sleep. 10/19/16  Yes [provider]  Turmeric (CURCUMIN 95 PO) Take 1,200 mg by mouth at bedtime.   Yes [provider]  vitamin C (ASCORBIC ACID) 500 MG tablet Take 500 mg by mouth daily.   Yes [provider]  vortioxetine HBr (TRINTELLIX) 10 MG TABS Take 1 tablet by mouth daily.   Yes [provider]   Allergies  Allergen Reactions  . Bee Venom   . Citalopram Tinitus  . Duloxetine Other (See Comments)  . Escitalopram Oxalate Tinitus  . Latex   . Penicillins Swelling and Rash    Has patient had a PCN reaction causing immediate rash, facial/tongue/throat swelling, SOB or lightheadedness with hypotension: Yes Has patient had a PCN reaction causing severe rash involving mucus membranes or skin necrosis: No Has patient had a PCN reaction that required hospitalization: No Has patient had a PCN reaction occurring within the last 10 years: No If all of the above answers are "NO", then may proceed with Cephalosporin use.    FAMILY HISTORY:  family history is not on file.   SOCIAL HISTORY:  reports that she has never smoked. She has never used smokeless tobacco. She reports that she does not drink alcohol or use drugs.  REVIEW OF SYSTEMS:  POSITIVES IN BOLD Constitutional: Negative for fever, chills, weight loss, malaise/fatigue and diaphoresis.  HENT: Negative for hearing loss, ear pain, nosebleeds, congestion, sore throat, neck pain, tinnitus and ear discharge.   Eyes: Negative for blurred vision, double vision, photophobia, pain, discharge and redness.  Respiratory: Negative for cough, hemoptysis, sputum production, extertional shortness of breath, wheezing and stridor.   Cardiovascular: Negative for jaw pain, chest pain, palpitations, orthopnea, claudication, leg swelling and PND.  Gastrointestinal: Negative for heartburn, nausea, vomiting, abdominal pain, diarrhea, constipation,  blood in stool and melena.   Genitourinary: Negative for dysuria, urgency, frequency, hematuria and flank pain.  Musculoskeletal: Negative for leg pain, myalgias, back pain, joint pain and falls.  Skin: Negative for itching and rash.  Neurological: Negative for dizziness, tingling, tremors, sensory change, speech change, focal weakness, seizures, loss of consciousness, weakness and headaches.  Endo/Heme/Allergies: Negative for environmental allergies and polydipsia. Does not bruise/bleed easily.  SUBJECTIVE:   VITAL SIGNS: Temp:  [97.9 F (36.6 C)-98.2 F (36.8 C)] 98.2 F (36.8 C) (05/24 1214) Pulse Rate:  [93-121] 106 (05/24 1214) Resp:  [14-18] 16 (05/24 1214) BP: (121-144)/(89-111) 133/89 (05/24 1214) SpO2:  [91 %-96 %] 96 % (05/24 1214) Weight:  [125 lb (56.7 kg)] 125 lb (56.7 kg) (05/24 1003)  PHYSICAL EXAMINATION: General:   Middle aged female, thin, no acute distress.  HEENT: [place]/AT, PERRL, no JVD Neuro: Alert, oriented, non-focal CV: s1s2 rrr, no m/r/g PULM: even/non-labored, lungs bilaterally, speaking full paragraphs. Room Air. Sats 96% JW:JXBJ, non-tender, bsx4 active  Extremities: warm/dry, no edema  Skin: no rashes or lesions   Recent Labs Lab 10/26/16 0745  NA 136  K 4.2  CL 101  CO2 24  BUN 17  CREATININE 0.86  GLUCOSE 133*    Recent Labs Lab 10/26/16 0745  HGB 13.0  HCT 37.8  WBC 15.2*  PLT 223   Dg Chest 2 View  Result Date: 10/26/2016 CLINICAL DATA:  Shortness of breath for 2 days EXAM: CHEST  2 VIEW COMPARISON:  None. FINDINGS: The heart size and mediastinal contours are within normal limits. Both lungs are clear. The visualized skeletal structures are unremarkable. IMPRESSION: No active cardiopulmonary disease. Electronically Signed   By: Alcide CleverMark  Lukens M.D.   On: 10/26/2016 07:39   Ct Angio Chest Pe W/cm &/or Wo Cm  Result Date: 10/26/2016 CLINICAL DATA:  Shortness of breath. EXAM: CT ANGIOGRAPHY CHEST WITH CONTRAST TECHNIQUE: Multidetector  CT imaging of the chest was performed using the standard protocol during bolus administration of intravenous contrast. Multiplanar CT image reconstructions and MIPs were obtained to evaluate the vascular anatomy. CONTRAST:  Isovue 370. Contrast dose not documented by technologist. COMPARISON:  Oct 26, 2016 FINDINGS: Cardiovascular: There is a large volume of pulmonary embolus involving all lobes. Numerous segmental branches are involved. There is mild extension into the right main pulmonary artery. No saddle embolus. The thoracic aorta is normal in caliber with no identified dissection. The heart is normal in size. The right ventricle diameter is 4 cm and the left is 3.6 cm with a RV/LV ratio of 1.11 Mediastinum/Nodes: No enlarged mediastinal, hilar, or axillary lymph nodes. Thyroid gland, trachea, and esophagus demonstrate no significant findings. Lungs/Pleura: Central airways are normal. No pneumothorax. No pulmonary nodules, masses, or pulmonary infiltrates. Upper Abdomen: No acute abnormality. Musculoskeletal: No chest wall abnormality. No acute or significant osseous findings. Review of the MIP images confirms the above findings. IMPRESSION: 1. Large pulmonary embolus burden affecting all lobes with an RV/LV ratio of 1.11 consistent with right heart strain. Positive for acute PE with CT evidence of right heart strain (RV/LV Ratio = 1.11) consistent with at least submassive (intermediate risk) PE. The presence of right heart strain has been associated with an increased risk of morbidity and mortality. Please activate Code PE by paging [phone]. Findings discussed with Dr. Donnald GarrePfeiffer. Electronically Signed   By: Gerome Samavid  Williams III M.D   On: 10/26/2016 09:42   SIGNIFICANT EVENTS  5/24 Admit with submassive PE  STUDIES:  Chest CTA 5/24 >> large PE burden affecting all lobes with RV/LV ratio of 1.11 c/w right heart strain EKG 5/24 >> ST 111, right atrial enlargement, ? RBBB/ LPFB TTE 5/24 >> vigorous LV  function, LVEF 65-70%, mild diastolic dysfunction, D-shaped septum c/w pulmonary hypertension, PAP 42, mild RVE w/severe RV dysfunction  ASSESSMENT / PLAN:  Submassive PE w/ right heart strain- RV/LV ratio 1.11 on CT. Severe RV dysfunction on echo. DVT positive. Troponin elevated. On exam, however, she is doing quite well. She is maintaining O2 sats high 90s on room air and is experiencing no dyspnea able to speak full paragraphs easily. She describes poorly controlled HTN so perhaps some pulmonary artery hypertension is chronic for her. We also have no old EKG to compare. 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    P:  - Continue heparin drip per pharmacy with plans to transition to lifelong DOAC vs warfarin - supplemental O2 prn for sats > 94% - If any decline in her condition, [facility]  [facility]. - check Factor 5 Leiden, homocysteine , prothrombin gene mutation, antiphospholipid antibodies. Protein C, S and antithrombin not accurate on heparin or in active thromboses.  - We will follow along.    Joneen Roach, AGACNP-BC [facility] Pulmonology/Critical Care Pager [phone] or [phone]  10/26/2016 8:16 PM

## 2016-10-26 NOTE — Progress Notes (Signed)
   D/w Dr Zelphia CairoPifer as ccm on call for code PE. This is a phone consult only for triage purpoises  PESI score - assuming pulse ox > 90% on RA - is class 2 and with hypoxemia she is class 3 RV/LV ratio 1.1  Vitals:   10/26/16 0645 10/26/16 0855 10/26/16 1003  BP: (!) 144/111 (!) 121/94   Pulse: (!) 121 (!) 104   Resp: 18 14   Temp: 97.9 F (36.6 C) 97.9 F (36.6 C)   TempSrc: Oral Oral   SpO2: 94% 91%   Weight:   56.7 kg (125 lb)  Height:   5\' 6"  (1.676 m)      No results for input(s): TROPONINI in the last 168 hours. No results for input(s): LATICACIDVEN, PROCALCITON in the last 168 hours.   A PE with PESI socre class 2-3 (total 5 classes)  P IV heparin for 3-5 days and use lysis as rescue - [facility] service Can use lactate and BNP and trop to further risk stratify    Dr. Kalman ShanMurali Jennalee Greaves, M.D., [facility] Division[facility]C.C.P Pulmonary and Critical Care Medicine Staff Physician [facility] Pulmonary and Critical Care Pager: [phone], If no answer or between  15:00h - 7:00h: call [phone]  10/26/2016 10:08 AM

## 2016-10-26 NOTE — Progress Notes (Signed)
*  PRELIMINARY RESULTS* Vascular Ultrasound Bilateral lower extremity venous duplex has been completed.  Preliminary findings: Findings consistent with a small segment of acute deep vein thrombosis involving one of the right peroneal veins and one of the left posterior tibial veins. Negative for bakers cysts bilaterally.  Preliminary results given to nurse,  Leanne @ 14:30.  Chauncey FischerCharlotte C Aidian Salomon 10/26/2016, 2:28 PM

## 2016-10-26 NOTE — ED Provider Notes (Signed)
[facility]-EMERGENCY DEPT Provider Note   CSN: [id number] Arrival date & time: 10/26/16  8295     History   Chief Complaint Chief Complaint  Patient presents with  . Shortness of Breath    HPI Faith Thomas is a 56 y.o. female.  HPI Patient has had shortness of breath for 3 days. No chest pain. Today she did note some pain around her jaw. She is primarily concerned for a blood clot. She reports she has a very strong family history of DVT\PE. No personal history. She reports she was having pain in her legs last week that has now resolved. No fever, no cough, no hemoptysis, no sputum production. No abdominal pain but patient reports she does have IBS and periodic constipation. Patient for she is also concerned because she had a checkup last week and her blood pressure was elevated at 149\90. She reports she does take dietary supplements, has a diagnosis of fibromyalgia, Raynouds, depression and anxiety. Past Medical History:  Diagnosis Date  . Fibromyalgia   . Genetic defect     Patient Active Problem List   Diagnosis Date Noted  . Metatarsalgia of both feet 04/24/2014  . Metatarsal deformity 04/24/2014    History reviewed. No pertinent surgical history.  OB History    No data available       Home Medications    Prior to Admission medications   Medication Sig Start Date End Date Taking? Authorizing Provider  ALPRAZolam Prudy Feeler) 0.5 MG tablet Take 0.5 mg by mouth at bedtime as needed for anxiety.   Yes [provider]  b complex vitamins tablet Take 1 tablet by mouth daily.   Yes [provider]  busPIRone (BUSPAR) 30 MG tablet Take 30 mg by mouth 2 (two) times daily.   Yes [provider]  Cholecalciferol (VITAMIN D-3) 5000 UNITS TABS Take by mouth daily.   Yes [provider]  Chromium 400 MCG TABS Take 400 mcg by mouth 2 (two) times daily.   Yes [provider]  clonazePAM (KLONOPIN) 0.5 MG tablet Take 0.5 mg by mouth at  bedtime.   Yes [provider]  erythromycin ophthalmic ointment Place 1 application into both eyes 3 (three) times daily. 09/07/16  Yes [provider]  L-Methylfolate 15 MG TABS Take 1 tablet by mouth daily. 10/23/16  Yes [provider]  L-Theanine 100 MG CAPS Take 200 mg by mouth at bedtime.   Yes [provider]  LACTOBACILLUS BIFIDUS PO Take 1 capsule by mouth 3 (three) times a week.   Yes [provider]  lisinopril (PRINIVIL,ZESTRIL) 5 MG tablet Take 2.5 mg by mouth daily.    Yes [provider]  methocarbamol (ROBAXIN) 500 MG tablet Take 1,000 mg by mouth daily. 09/01/16  Yes [provider]  naproxen sodium (ANAPROX) 220 MG tablet Take 440-660 mg by mouth 2 (two) times daily with a meal.   Yes [provider]  Omega 3 1200 MG CAPS Take 1,200 mg by mouth daily.   Yes [provider]  predniSONE (DELTASONE) 20 MG tablet Take 20 mg by mouth 2 (two) times daily with a meal.   Yes [provider]  ranitidine (ZANTAC) 150 MG tablet Take 150 mg by mouth daily. 09/29/16  Yes [provider]  S-Adenosylmethionine (SAM-E) 400 MG TABS Take 400 mg by mouth daily.   Yes [provider]  traMADol (ULTRAM) 50 MG tablet Take 50 mg by mouth 4 (four) times daily as needed for  pain. 10/08/16  Yes [provider]  traZODone (DESYREL) 50 MG tablet Take 100 mg by mouth at bedtime as needed for sleep. 10/19/16  Yes [provider]  Turmeric (CURCUMIN 95 PO) Take 1,200 mg by mouth at bedtime.   Yes [provider]  vitamin C (ASCORBIC ACID) 500 MG tablet Take 500 mg by mouth daily.   Yes [provider]  vortioxetine HBr (TRINTELLIX) 10 MG TABS Take 1 tablet by mouth daily.   Yes [provider]    Family History History reviewed. No pertinent family history.  Social History Social History  Substance Use Topics  . Smoking status: Never Smoker  . Smokeless  tobacco: Never Used  . Alcohol use No     Allergies   Bee venom; Latex; and Penicillins   Review of Systems Review of Systems  10 Systems reviewed and are negative for acute change except as noted in the HPI.  Physical Exam Updated Vital Signs BP (!) 121/94 (BP Location: Left Arm)   Pulse (!) 104   Temp 97.9 F (36.6 C) (Oral)   Resp 14   Ht 5\' 6"  (1.676 m)   Wt 56.7 kg (125 lb)   SpO2 91%   BMI 20.18 kg/m   Physical Exam  Constitutional: She is oriented to person, place, and time. She appears well-developed and well-nourished. No distress.  HENT:  Head: Normocephalic and atraumatic.  Nose: Nose normal.  Mouth/Throat: Oropharynx is clear and moist.  Dentition excellent condition. No facial or jaw swelling.  Eyes: Conjunctivae and EOM are normal. Pupils are equal, round, and reactive to light.  Neck: Neck supple. No tracheal deviation present. No thyromegaly present.  Cardiovascular: Normal rate, regular rhythm, normal heart sounds and intact distal pulses.   No murmur heard. Pulmonary/Chest: Effort normal and breath sounds normal. No respiratory distress.  Abdominal: Soft. Bowel sounds are normal. She exhibits no distension and no mass. There is no tenderness. There is no guarding.  Musculoskeletal: She exhibits no edema, tenderness or deformity.  Lymphadenopathy:    She has no cervical adenopathy.  Neurological: She is alert and oriented to person, place, and time. No cranial nerve deficit. She exhibits normal muscle tone. Coordination normal.  Skin: Skin is warm and dry.  Psychiatric: She has a normal mood and affect.  Nursing note and vitals reviewed.    ED Treatments / Results  Labs (all labs ordered are listed, but only abnormal results are displayed) Labs Reviewed  CBC WITH DIFFERENTIAL/PLATELET - Abnormal; Notable for the following:       Result Value   WBC 15.2 (*)    Neutro Abs 12.8 (*)    All other components within normal limits  D-DIMER,  QUANTITATIVE (NOT AT [facility]) - Abnormal; Notable for the following:    D-Dimer, Quant 5.97 (*)    All other components within normal limits  BASIC METABOLIC PANEL - Abnormal; Notable for the following:    Glucose, Bld 133 (*)    All other components within normal limits  I-STAT TROPOININ, ED - Abnormal; Notable for the following:    Troponin i, poc 0.17 (*)    All other components within normal limits  URINALYSIS, ROUTINE W REFLEX MICROSCOPIC  RAPID URINE DRUG SCREEN, HOSP PERFORMED  TSH  APTT  PROTIME-INR    EKG  EKG Interpretation  Date/Time:  Thursday Oct 26 2016 08:08:02 EDT Ventricular Rate:  111 PR Interval:    QRS Duration: 121 QT Interval:  351 QTC Calculation: 477 R  Axis:   101 Text Interpretation:  Sinus tachycardia Right atrial enlargement RBBB and LPFB agree. no oid comparison Confirmed by Arby Barrette [phone]) on 10/26/2016 8:45:41 AM       Radiology Dg Chest 2 View  Result Date: 10/26/2016 CLINICAL DATA:  Shortness of breath for 2 days EXAM: CHEST  2 VIEW COMPARISON:  None. FINDINGS: The heart size and mediastinal contours are within normal limits. Both lungs are clear. The visualized skeletal structures are unremarkable. IMPRESSION: No active cardiopulmonary disease. Electronically Signed   By: Alcide Clever M.D.   On: 10/26/2016 07:39   Ct Angio Chest Pe W/cm &/or Wo Cm  Result Date: 10/26/2016 CLINICAL DATA:  Shortness of breath. EXAM: CT ANGIOGRAPHY CHEST WITH CONTRAST TECHNIQUE: Multidetector CT imaging of the chest was performed using the standard protocol during bolus administration of intravenous contrast. Multiplanar CT image reconstructions and MIPs were obtained to evaluate the vascular anatomy. CONTRAST:  Isovue 370. Contrast dose not documented by technologist. COMPARISON:  Oct 26, 2016 FINDINGS: Cardiovascular: There is a large volume of pulmonary embolus involving all lobes. Numerous segmental branches are involved. There is mild extension into the  right main pulmonary artery. No saddle embolus. The thoracic aorta is normal in caliber with no identified dissection. The heart is normal in size. The right ventricle diameter is 4 cm and the left is 3.6 cm with a RV/LV ratio of 1.11 Mediastinum/Nodes: No enlarged mediastinal, hilar, or axillary lymph nodes. Thyroid gland, trachea, and esophagus demonstrate no significant findings. Lungs/Pleura: Central airways are normal. No pneumothorax. No pulmonary nodules, masses, or pulmonary infiltrates. Upper Abdomen: No acute abnormality. Musculoskeletal: No chest wall abnormality. No acute or significant osseous findings. Review of the MIP images confirms the above findings. IMPRESSION: 1. Large pulmonary embolus burden affecting all lobes with an RV/LV ratio of 1.11 consistent with right heart strain. Positive for acute PE with CT evidence of right heart strain (RV/LV Ratio = 1.11) consistent with at least submassive (intermediate risk) PE. The presence of right heart strain has been associated with an increased risk of morbidity and mortality. Please activate Code PE by paging [phone]. Findings discussed with Dr. Donnald Garre. Electronically Signed   By: Gerome Sam III M.D   On: 10/26/2016 09:42    Procedures Procedures (including critical care time) CRITICAL CARE Performed by: Arby Barrette   Total critical care time: 30 minutes  Critical care time was exclusive of separately billable procedures and treating other patients.  Critical care was necessary to treat or prevent imminent or life-threatening deterioration.  Critical care was time spent personally by me on the following activities: development of treatment plan with patient and/or surrogate as well as nursing, discussions with consultants, evaluation of patient's response to treatment, examination of patient, obtaining history from patient or surrogate, ordering and performing treatments and interventions, ordering and review of  laboratory studies, ordering and review of radiographic studies, pulse oximetry and re-evaluation of patient's condition.  Medications Ordered in ED Medications  heparin bolus via infusion 3,500 Units (not administered)  heparin ADULT infusion 100 units/mL (25000 units/26mL sodium chloride 0.45%) (not administered)  iopamidol (ISOVUE-370) 76 % injection 100 mL (100 mLs Intravenous Contrast Given 10/26/16 0911)     Initial Impression / Assessment and Plan / ED Course  I have reviewed the triage vital signs and the nursing notes.  Pertinent labs & imaging results that were available during my care of the patient were reviewed by me and considered in my medical decision making (see chart  for details).     Consult: Dr. Marchelle Gearing intensivist to review PE with right heart strain. At this time is stable for hospitalist admission on heparin. Consult: [facility] hospitalist dr. Jerral Ralph, for admission. Final Clinical Impressions(s) / ED Diagnoses   Final diagnoses:  Other acute pulmonary embolism with acute cor pulmonale (HCC)   Patient presents as outlined above with dyspnea for 3 days duration. Patient had very strong family history for PE. D-dimer returned significantly elevated and PE study is confirmatory for bilateral PE with submassive criteria. This was reviewed with the intensivist. Clinically however patient is stable with clear mental status, no respiratory distress, no hypotension and no significant  hypoxia at rest. Plan will be for admission to hospitalist service. New Prescriptions New Prescriptions   No medications on file     Arby Barrette, MD 10/26/16 1051

## 2016-10-26 NOTE — Progress Notes (Signed)
ANTICOAGULATION CONSULT NOTE - Initial Consult  Pharmacy Consult for IV Heparin Indication: pulmonary embolus  Allergies  Allergen Reactions  . Bee Venom   . Citalopram Tinitus  . Duloxetine Other (See Comments)  . Escitalopram Oxalate Tinitus  . Latex   . Penicillins Swelling and Rash    Has patient had a PCN reaction causing immediate rash, facial/tongue/throat swelling, SOB or lightheadedness with hypotension: Yes Has patient had a PCN reaction causing severe rash involving mucus membranes or skin necrosis: No Has patient had a PCN reaction that required hospitalization: No Has patient had a PCN reaction occurring within the last 10 years: No If all of the above answers are "NO", then may proceed with Cephalosporin use.    Patient Measurements: Height: 5\' 6"  (167.6 cm) Weight: 125 lb (56.7 kg) IBW/kg (Calculated) : 59.3 Heparin Dosing Weight:   Vital Signs: Temp: 97.9 F (36.6 C) (05/24 0855) Temp Source: Oral (05/24 0855) BP: 130/102 (05/24 1100) Pulse Rate: 93 (05/24 1100)  Labs:  Recent Labs  10/26/16 0745  HGB 13.0  HCT 37.8  PLT 223  APTT 24  LABPROT 12.5  INR 0.94  CREATININE 0.86    Estimated Creatinine Clearance: 67.7 mL/min (by C-G formula based on SCr of 0.86 mg/dL).   Medical History: Past Medical History:  Diagnosis Date  . Fibromyalgia   . Genetic defect     Medications:  Scheduled:    Assessment: Pharmacy is consulted to dose heparin in 56 yo female diagnosed with submassive PE. Pt with a strong family history of DVT/PE. Per patient, there is no recent history of air travel, prolonged immobilization or surgery. She denies taking any hormonal supplements, however she is on numerous over-the-counter vitamins and other supplementations.   Baseline Labs:   Hgb 13, plt 223  PT 12.5, INR 0.94  SCr 0.86   D-Dimer 5.97  Goal of Therapy:  Heparin level 0.3-0.7 units/ml Monitor platelets by anticoagulation protocol: Yes   Plan:    Heparin 3500 unit bolus followed by heparin 950 units/hr   HL 6 hours after start of infusion  Monitor for signs and symptoms of bleeding   Adalberto ColeNikola Jaishaun Mcnab, PharmD, BCPS Pager [phone] 10/26/2016 12:04 PM

## 2016-10-27 DIAGNOSIS — M5440 Lumbago with sciatica, unspecified side: Secondary | ICD-10-CM

## 2016-10-27 DIAGNOSIS — F419 Anxiety disorder, unspecified: Secondary | ICD-10-CM

## 2016-10-27 DIAGNOSIS — G8929 Other chronic pain: Secondary | ICD-10-CM

## 2016-10-27 DIAGNOSIS — R0902 Hypoxemia: Secondary | ICD-10-CM

## 2016-10-27 LAB — HEPARIN LEVEL (UNFRACTIONATED)
HEPARIN UNFRACTIONATED: 0.51 [IU]/mL (ref 0.30–0.70)
Heparin Unfractionated: 0.5 IU/mL (ref 0.30–0.70)

## 2016-10-27 LAB — CBC
HCT: 41.6 % (ref 36.0–46.0)
Hemoglobin: 14.3 g/dL (ref 12.0–15.0)
MCH: 33.2 pg (ref 26.0–34.0)
MCHC: 34.4 g/dL (ref 30.0–36.0)
MCV: 96.5 fL (ref 78.0–100.0)
PLATELETS: 262 10*3/uL (ref 150–400)
RBC: 4.31 MIL/uL (ref 3.87–5.11)
RDW: 14.3 % (ref 11.5–15.5)
WBC: 18.9 10*3/uL — AB (ref 4.0–10.5)

## 2016-10-27 LAB — HIV ANTIBODY (ROUTINE TESTING W REFLEX): HIV SCREEN 4TH GENERATION: NONREACTIVE

## 2016-10-27 LAB — COMPREHENSIVE METABOLIC PANEL
ALBUMIN: 3.9 g/dL (ref 3.5–5.0)
ALT: 30 U/L (ref 14–54)
ANION GAP: 9 (ref 5–15)
AST: 28 U/L (ref 15–41)
Alkaline Phosphatase: 43 U/L (ref 38–126)
BUN: 17 mg/dL (ref 6–20)
CHLORIDE: 100 mmol/L — AB (ref 101–111)
CO2: 28 mmol/L (ref 22–32)
Calcium: 9.3 mg/dL (ref 8.9–10.3)
Creatinine, Ser: 0.83 mg/dL (ref 0.44–1.00)
GFR calc Af Amer: >60 mL/min (ref >60)
Glucose, Bld: 105 mg/dL — ABNORMAL HIGH (ref 65–99)
POTASSIUM: 4 mmol/L (ref 3.5–5.1)
Sodium: 137 mmol/L (ref 135–145)
TOTAL PROTEIN: 6.4 g/dL — AB (ref 6.5–8.1)
Total Bilirubin: 0.6 mg/dL (ref 0.3–1.2)

## 2016-10-27 MED ORDER — ALUM & MAG HYDROXIDE-SIMETH 200-200-20 MG/5ML PO SUSP
30.0000 mL | ORAL | Status: DC | PRN
  Administered 2016-10-27: 30 mL via ORAL
  Filled 2016-10-27: qty 30

## 2016-10-27 MED ORDER — TRAMADOL HCL 50 MG PO TABS: 50.0000 mg | ORAL_TABLET | Freq: Four times a day (QID) | ORAL | Status: DC | PRN

## 2016-10-27 MED ORDER — APIXABAN 5 MG PO TABS
10.0000 mg | ORAL_TABLET | Freq: Two times a day (BID) | ORAL | Status: DC
  Administered 2016-10-27 – 2016-10-28 (×2): 10 mg via ORAL
  Filled 2016-10-27 (×2): qty 2

## 2016-10-27 MED ORDER — APIXABAN 5 MG PO TABS: 5.0000 mg | ORAL_TABLET | Freq: Two times a day (BID) | ORAL | Status: DC

## 2016-10-27 MED ORDER — HYDROCODONE-ACETAMINOPHEN 5-325 MG PO TABS: 1.0000 | ORAL_TABLET | ORAL | Status: DC | PRN

## 2016-10-27 NOTE — Progress Notes (Addendum)
ANTICOAGULATION CONSULT NOTE - Follow Up Consult  Pharmacy Consult for Heparin Indication: pulmonary embolus and DVT  Allergies  Allergen Reactions  . Bee Venom   . Citalopram Tinitus  . Duloxetine Other (See Comments)  . Escitalopram Oxalate Tinitus  . Latex   . Penicillins Swelling and Rash    Has patient had a PCN reaction causing immediate rash, facial/tongue/throat swelling, SOB or lightheadedness with hypotension: Yes Has patient had a PCN reaction causing severe rash involving mucus membranes or skin necrosis: No Has patient had a PCN reaction that required hospitalization: No Has patient had a PCN reaction occurring within the last 10 years: No If all of the above answers are "NO", then may proceed with Cephalosporin use.    Patient Measurements: Height: 5\' 6"  (167.6 cm) Weight: 125 lb (56.7 kg) IBW/kg (Calculated) : 59.3 Heparin Dosing Weight: actual weight, 56.7 kg  Vital Signs: Temp: 97.9 F (36.6 C) (05/25 0452) Temp Source: Oral (05/25 0452) BP: 114/78 (05/25 0452) Pulse Rate: 78 (05/25 0452)  Labs:  Recent Labs  10/26/16 0745 10/26/16 1232 10/26/16 1706 10/27/16 0116 10/27/16 1144  HGB 13.0  --   --  14.3  --   HCT 37.8  --   --  41.6  --   PLT 223  --   --  262  --   APTT 24  --   --   --   --   LABPROT 12.5  --   --   --   --   INR 0.94  --   --   --   --   HEPARINUNFRC  --   --  1.00* 0.51 0.50  CREATININE 0.86  --   --  0.83  --   CKTOTAL  --  59  --   --   --     Estimated Creatinine Clearance: 70.2 mL/min (by C-G formula based on SCr of 0.83 mg/dL).   Medications:  Infusions:  . sodium chloride    . heparin 800 Units/hr (10/26/16 2039)    Assessment: [age] yoF presented to ED on 5/24 with worsening exertional dyspnea.  Family history of thrombotic events in mother, father, and brother.  She denies recent travel, immobilization, surgery, or hormonal therapy.  CT showed submassive PE and venous duplex is positive for right peroneal DVT and  left posterior tibial DVT.  No intervention planned at this time.  Pharmacy is consulted to dose Heparin IV.  Today, 10/27/2016  Heparin level = 0.50, therapeutic on 800 units/hr  CBC wnl  No bleeding reported  Goal of Therapy:  Heparin level 0.3-0.7 units/ml Monitor platelets by anticoagulation protocol: Yes   Plan:   Continue IV heparin at 800 units/hr  Daily heparin level and CBC  Follow up transition to oral anticoagulation  Loralee PacasErin Dolce Sylvia, PharmD, BCPS Pager: [phone] 10/27/2016 12:20 PM   Addendum: To transition to Eliquis today  Plan:  Stop IV heparin at 16:00 today  Begin Eliquis 10mg  BID with meals x 7 days, then 5mg  BID  Provide education prior to discharge  Loralee PacasErin Neelam Tiggs, PharmD, BCPS 10/27/2016 2:19 PM

## 2016-10-27 NOTE — Progress Notes (Signed)
ANTICOAGULATION CONSULT NOTE - Follow Up Consult  Pharmacy Consult for Heparin Indication: pulmonary embolus and DVT  Allergies  Allergen Reactions  . Bee Venom   . Citalopram Tinitus  . Duloxetine Other (See Comments)  . Escitalopram Oxalate Tinitus  . Latex   . Penicillins Swelling and Rash    Has patient had a PCN reaction causing immediate rash, facial/tongue/throat swelling, SOB or lightheadedness with hypotension: Yes Has patient had a PCN reaction causing severe rash involving mucus membranes or skin necrosis: No Has patient had a PCN reaction that required hospitalization: No Has patient had a PCN reaction occurring within the last 10 years: No If all of the above answers are "NO", then may proceed with Cephalosporin use.    Patient Measurements: Height: 5\' 6"  (167.6 cm) Weight: 125 lb (56.7 kg) IBW/kg (Calculated) : 59.3 Heparin Dosing Weight:   Vital Signs: Temp: 97.9 F (36.6 C) (05/25 0452) Temp Source: Oral (05/25 0452) BP: 114/78 (05/25 0452) Pulse Rate: 78 (05/25 0452)  Labs:  Recent Labs  10/26/16 0745 10/26/16 1232 10/26/16 1706 10/27/16 0116  HGB 13.0  --   --  14.3  HCT 37.8  --   --  41.6  PLT 223  --   --  262  APTT 24  --   --   --   LABPROT 12.5  --   --   --   INR 0.94  --   --   --   HEPARINUNFRC  --   --  1.00* 0.51  CREATININE 0.86  --   --  0.83  CKTOTAL  --  59  --   --     Estimated Creatinine Clearance: 70.2 mL/min (by C-G formula based on SCr of 0.83 mg/dL).   Medications:  Infusions:  . sodium chloride    . heparin 800 Units/hr (10/26/16 2039)    Assessment: Patient with heparin level at goal.  No heparin issues noted.  Goal of Therapy:  Heparin level 0.3-0.7 units/ml Monitor platelets by anticoagulation protocol: Yes   Plan:  Continue heparin drip at current rate Recheck level at 1200  Darlina GuysGrimsley Jr, BangorJulian Crowford 10/27/2016,6:57 AM

## 2016-10-27 NOTE — Progress Notes (Addendum)
[facility] PROGRESS NOTE    Progress Note  Faith Thomas  [id number] DOB: 1961/03/09 DOA: 10/26/2016 PCP: Andi DevonShelton, Kimberly, MD     Brief Narrative:   Faith CiscoMary A Thomas is an 56 y.o. female past medical history significant for fibromyalgia Aggrenox percent with worsening exertional dyspnea that started approximately 4 days prior to admission CT images of the chest was done that showed submassivepulmonary embolism  Assessment/Plan:   Submassive Pulmonary embolism [facility](HCC): Her blood pressure has been stable greater than 110/80, she has not been tachycardic she is satting 100% on room air. Will change IV heparin to Apixiban. 2-D echo shows some mild right-sided dilation. She relates her shortness of breath is better. Lower extremity Dopplers showed small DVT. Hypercoagulable panel ordered on admission. Patient is significantly melodramatic we have talked about decreasing her tramadol and starting  her on Tylenol and Norco as needed. When the nurse went back into her room,  she was displeased as she did not want Norco and she wanted the tramadol PRN when necessary. Patient seems to not understand what we have conversations as I try to explain to her for more than 15 minutes the plan. She seems really keen instilled in the hospital until she feels comfortable to go home.  Chronic back pain/Fibromyalgia: Continue prednisone and tramadol further management per PCP.  Anxiety: Continue current regimen.  DVT prophylaxis: heparin Family Communication:none Disposition Plan/Barrier to D/C: home in am Code Status:     Code Status Orders        Start     Ordered   10/26/16 1212  Full code  Continuous     10/26/16 1211    Code Status History    Date Active Date Inactive Code Status Order ID Comments User Context   This patient has a current code status but no historical code status.        IV Access:    Peripheral IV   Procedures and diagnostic studies:   Dg  Chest 2 View  Result Date: 10/26/2016 CLINICAL DATA:  Shortness of breath for 2 days EXAM: CHEST  2 VIEW COMPARISON:  None. FINDINGS: The heart size and mediastinal contours are within normal limits. Both lungs are clear. The visualized skeletal structures are unremarkable. IMPRESSION: No active cardiopulmonary disease. Electronically Signed   By: Alcide CleverMark  Lukens M.D.   On: 10/26/2016 07:39   Ct Angio Chest Pe W/cm &/or Wo Cm  Result Date: 10/26/2016 CLINICAL DATA:  Shortness of breath. EXAM: CT ANGIOGRAPHY CHEST WITH CONTRAST TECHNIQUE: Multidetector CT imaging of the chest was performed using the standard protocol during bolus administration of intravenous contrast. Multiplanar CT image reconstructions and MIPs were obtained to evaluate the vascular anatomy. CONTRAST:  Isovue 370. Contrast dose not documented by technologist. COMPARISON:  Oct 26, 2016 FINDINGS: Cardiovascular: There is a large volume of pulmonary embolus involving all lobes. Numerous segmental branches are involved. There is mild extension into the right main pulmonary artery. No saddle embolus. The thoracic aorta is normal in caliber with no identified dissection. The heart is normal in size. The right ventricle diameter is 4 cm and the left is 3.6 cm with a RV/LV ratio of 1.11 Mediastinum/Nodes: No enlarged mediastinal, hilar, or axillary lymph nodes. Thyroid gland, trachea, and esophagus demonstrate no significant findings. Lungs/Pleura: Central airways are normal. No pneumothorax. No pulmonary nodules, masses, or pulmonary infiltrates. Upper Abdomen: No acute abnormality. Musculoskeletal: No chest wall abnormality. No acute or significant osseous findings. Review of the MIP images confirms the  above findings. IMPRESSION: 1. Large pulmonary embolus burden affecting all lobes with an RV/LV ratio of 1.11 consistent with right heart strain. Positive for acute PE with CT evidence of right heart strain (RV/LV Ratio = 1.11) consistent with at  least submassive (intermediate risk) PE. The presence of right heart strain has been associated with an increased risk of morbidity and mortality. Please activate Code PE by paging [phone]. Findings discussed with Dr. Donnald Garre. Electronically Signed   By: Gerome Sam III M.D   On: 10/26/2016 09:42     Medical Consultants:    None.  Anti-Infectives:   None  Subjective:    Faith Cisco she is complaining that she has multiple symptoms headaches. Blurry Vision new left-sided chest pain. . She doesn't that she doesn't feel safe. Objective:    Vitals:   10/26/16 1214 10/26/16 2040 10/27/16 0103 10/27/16 0452  BP: 133/89 (!) 160/109 (!) 132/93 114/78  Pulse: (!) 106 95 83 78  Resp: 16 20  18   Temp: 98.2 F (36.8 C) 97.9 F (36.6 C)  97.9 F (36.6 C)  TempSrc: Oral Oral  Oral  SpO2: 96% 97%  100%  Weight:      Height:        Intake/Output Summary (Last 24 hours) at 10/27/16 0732 Last data filed at 10/27/16 0700  Gross per 24 hour  Intake           112.41 ml  Output                0 ml  Net           112.41 ml   Filed Weights   10/26/16 1003  Weight: 56.7 kg (125 lb)    Exam: General exam: She seems to be in no acute distress Respiratory system: Good air movement throughout to auscultation Cardiovascular system: Regular rate and rhythm with positive S1 and S2. Gastrointestinal system: Positive bowel sounds nontender nondistended  Central nervous system: awake alert and oriented 3 nonfocal Extremities: No pedal edema. Skin: no rashes  Psychiatry:  judgment and insight appear normal, but patient is slightly low dramatic    Data Reviewed:    Labs: Basic Metabolic Panel:  Recent Labs Lab 10/26/16 0745 10/27/16 0116  NA 136 137  K 4.2 4.0  CL 101 100*  CO2 24 28  GLUCOSE 133* 105*  BUN 17 17  CREATININE 0.86 0.83  CALCIUM 9.4 9.3   GFR Estimated Creatinine Clearance: 70.2 mL/min (by C-G formula based on SCr of 0.83 mg/dL). Liver  Function Tests:  Recent Labs Lab 10/27/16 0116  AST 28  ALT 30  ALKPHOS 43  BILITOT 0.6  PROT 6.4*  ALBUMIN 3.9   No results for input(s): LIPASE, AMYLASE in the last 168 hours. No results for input(s): AMMONIA in the last 168 hours. Coagulation profile  Recent Labs Lab 10/26/16 0745  INR 0.94    CBC:  Recent Labs Lab 10/26/16 0745 10/27/16 0116  WBC 15.2* 18.9*  NEUTROABS 12.8*  --   HGB 13.0 14.3  HCT 37.8 41.6  MCV 96.4 96.5  PLT 223 262   Cardiac Enzymes:  Recent Labs Lab 10/26/16 1232  CKTOTAL 59   BNP (last 3 results) No results for input(s): PROBNP in the last 8760 hours. CBG: No results for input(s): GLUCAP in the last 168 hours. D-Dimer:  Recent Labs  10/26/16 0745  DDIMER 5.97*   Hgb A1c: No results for input(s): HGBA1C in the last 72 hours. Lipid Profile:  No results for input(s): CHOL, HDL, LDLCALC, TRIG, CHOLHDL, LDLDIRECT in the last 72 hours. Thyroid function studies:  Recent Labs  10/26/16 0746  TSH 2.276   Anemia work up: No results for input(s): VITAMINB12, FOLATE, FERRITIN, TIBC, IRON, RETICCTPCT in the last 72 hours. Sepsis Labs:  Recent Labs Lab 10/26/16 0745 10/27/16 0116  WBC 15.2* 18.9*   Microbiology No results found for this or any previous visit (from the past 240 hour(s)).   Medications:   . busPIRone  30 mg Oral BID  . cholecalciferol  5,000 Units Oral Daily  . clonazePAM  0.5 mg Oral QHS  . famotidine  20 mg Oral Daily  . l-methylfolate-B6-B12  1 tablet Oral Daily  . methocarbamol  1,000 mg Oral Daily  . predniSONE  40 mg Oral Daily  . sodium chloride flush  3 mL Intravenous Q12H  . sodium chloride flush  3 mL Intravenous Q12H  . vitamin C  500 mg Oral Daily  . vortioxetine HBr  1 tablet Oral Daily   Continuous Infusions: . sodium chloride    . heparin 800 Units/hr (10/26/16 2039)    Time spent: 25 min   LOS: 1 day   Marinda Elk  [facility] Pager [phone]  *Please  refer to [email or web address], password [email or web address] to get updated schedule on who will round on this patient, as hospitalists switch teams weekly. If 7PM-7AM, please contact night-coverage at [email or web address], password [email or web address] for any overnight needs.  10/27/2016, 7:32 AM

## 2016-10-27 NOTE — Progress Notes (Signed)
MD on call was notified of elevated BP. New order received for prn Hydralazine. Will continue to monitor patient.

## 2016-10-27 NOTE — Progress Notes (Signed)
Eliquis and Xarelto is a zero co pay to pt/ insurance check.

## 2016-10-27 NOTE — Progress Notes (Signed)
Name: Faith CiscoMary A Verne MRN: [id number] DOB: 1961/03/27    ADMISSION DATE:  10/26/2016 CONSULTATION DATE:  10/26/2016  REFERRING MD :  Dr. Jerral RalphGhimire  CHIEF COMPLAINT:  Submassive PE  HISTORY OF PRESENT ILLNESS:   56 year old female with PMH significant for never smoker, fibromyalgia, anxiety, depression, Raynaud's phenomenon, GERD, vitamin D deficiency, insomnia, newly diagnosed HTN (not on meds, watching per [facility] records), and patient reported IBS/ constipation admitted on 5/24 with submassive pulmonary embolism.  She reports progressive exertional dyspnea over the last week, worse in the last two days with development of jaw pain.  Additionally, she had some pain in her legs last week, but since that has resolved.  Patient reports a very strong family history of DVT and PE in both parents and brother; therefore, she was concerned and came to the hospital.  In the ER, labs showed D-dimer 5.97, troponin 0.17, WBC 15.2. EKG with sinus tachycardia.   Chest CT showed a submassive PE with RV/LV ratio of 1.11 consistent with right heart strain.  Hemodynamically, she was stable with normal oxygen saturations on room air.  She was started on heparin drip and admitted to the hospitalists service.    She denies any chest pain, syncope, fever, recent prolonged travel, immobilization, hormonal therapy, weight loss, change or blood in stool, night sweats, or previous venous thromboembolism.  Upon chart review, no previous autoimmune workup noted.  She takes multiple OTC vitamins and supplements as she likes to treat her medical issues naturally and tries to limit medication.   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   PCCM to consult for submassive PE.  SUBJECTIVE:  No events overnight, no new complaints, remains on room air  VITAL SIGNS: General: Middle aged female, thin, no acute distress HEENT: [place]/AT, PERRL, EOM-I and MMM Neuro: Alert and oriented, non-focal CV:  RRR, Nl S1/S2, -M/R/G. PULM: CTA bilaterally GI: Soft, NT, ND and +BS Extremities: Warm and dry, no edema Skin: Intact   Recent Labs Lab 10/26/16 0745 10/27/16 0116  NA 136 137  K 4.2 4.0  CL 101 100*  CO2 24 28  BUN 17 17  CREATININE 0.86 0.83  GLUCOSE 133* 105*    Recent Labs Lab 10/26/16 0745 10/27/16 0116  HGB 13.0 14.3  HCT 37.8 41.6  WBC 15.2* 18.9*  PLT 223 262   Dg Chest 2 View  Result Date: 10/26/2016 CLINICAL DATA:  Shortness of breath for 2 days EXAM: CHEST  2 VIEW COMPARISON:  None. FINDINGS: The heart size and mediastinal contours are within normal limits. Both lungs are clear. The visualized skeletal structures are unremarkable. IMPRESSION: No active cardiopulmonary disease. Electronically Signed   By: Alcide CleverMark  Lukens M.D.   On: 10/26/2016 07:39   Ct Angio Chest Pe W/cm &/or Wo Cm  Result Date: 10/26/2016 CLINICAL DATA:  Shortness of breath. EXAM: CT ANGIOGRAPHY CHEST WITH CONTRAST TECHNIQUE: Multidetector CT imaging of the chest was performed using the standard protocol during bolus administration of intravenous contrast. Multiplanar CT image reconstructions and MIPs were obtained to evaluate the vascular anatomy. CONTRAST:  Isovue 370. Contrast dose not documented by technologist. COMPARISON:  Oct 26, 2016 FINDINGS: Cardiovascular: There is a large volume of pulmonary embolus involving all lobes. Numerous segmental branches are involved. There is mild extension into the right main pulmonary artery. No saddle embolus. The thoracic aorta is normal in caliber with no identified dissection. The heart is normal in size. The right ventricle diameter  is 4 cm and the left is 3.6 cm with a RV/LV ratio of 1.11 Mediastinum/Nodes: No enlarged mediastinal, hilar, or axillary lymph nodes. Thyroid gland, trachea, and esophagus demonstrate no significant findings. Lungs/Pleura: Central airways are normal. No pneumothorax. No pulmonary nodules, masses, or pulmonary infiltrates. Upper  Abdomen: No acute abnormality. Musculoskeletal: No chest wall abnormality. No acute or significant osseous findings. Review of the MIP images confirms the above findings. IMPRESSION: 1. Large pulmonary embolus burden affecting all lobes with an RV/LV ratio of 1.11 consistent with right heart strain. Positive for acute PE with CT evidence of right heart strain (RV/LV Ratio = 1.11) consistent with at least submassive (intermediate risk) PE. The presence of right heart strain has been associated with an increased risk of morbidity and mortality. Please activate Code PE by paging [phone]. Findings discussed with Dr. Donnald Garre. Electronically Signed   By: Gerome Sam III M.D   On: 10/26/2016 09:42   SIGNIFICANT EVENTS  5/24 Admit with submassive PE  STUDIES:  Chest CTA 5/24 >> large PE burden affecting all lobes with RV/LV ratio of 1.11 c/w right heart strain EKG 5/24 >> ST 111, right atrial enlargement, ? RBBB/ LPFB TTE 5/24 >> vigorous LV function, LVEF 65-70%, mild diastolic dysfunction, D-shaped septum c/w pulmonary hypertension, PAP 42, mild RVE w/severe RV dysfunction  I reviewed CT myself, PE noted  ASSESSMENT / PLAN:  Submassive PE w/ right heart strain- RV/LV ratio 1.11 on CT. Severe RV dysfunction on echo. DVT positive. Troponin elevated. On exam, however, she is doing quite well. She is maintaining O2 sats high 90s on room air and is experiencing no dyspnea able to speak full paragraphs easily. She describes poorly controlled HTN so perhaps some pulmonary artery hypertension is chronic for her. We also have no old EKG to compare. 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 Discussed with PCCM-NP.  PE:  - Heparin  - Coumadin vs NOAC are both acceptable options  - 2D echo  DVT:  - Lower ext dopplers  Hypoxemia:  - Titrate O2 for sat of 88-92%   Fibromyalgia:  - Resume home  medications  PCCM will sign off, please call back if needed.  Alyson Reedy, M.D. [facility] Pulmonary/Critical Care Medicine. Pager: [phone]. After hours pager: [phone].  10/27/2016 10:14 AM

## 2016-10-27 NOTE — Progress Notes (Signed)
Per Pharmacy, ok to start Eliquis now since Heparin drip has been off for 2 hours.

## 2016-10-27 NOTE — Progress Notes (Signed)
After introductions, explained to the pt CM's duties and explained that the MD had asked me to check with her about her medications and co pay. Pt states that she is going home on coumadin. Explained to pt that was on the $4 list for [facility], [facility], and most Pharmacies. Pt states that MD is sending her home with something else. This CM told the patient I would check with her MD. Pt's co pay is zero, however when a return visit was made, pt was asleep.  There is a zero co pay for both medications.

## 2016-10-28 DIAGNOSIS — M797 Fibromyalgia: Secondary | ICD-10-CM

## 2016-10-28 LAB — CBC
HCT: 45 % (ref 36.0–46.0)
HEMOGLOBIN: 15.7 g/dL — AB (ref 12.0–15.0)
MCH: 33.7 pg (ref 26.0–34.0)
MCHC: 34.9 g/dL (ref 30.0–36.0)
MCV: 96.6 fL (ref 78.0–100.0)
PLATELETS: 285 10*3/uL (ref 150–400)
RBC: 4.66 MIL/uL (ref 3.87–5.11)
RDW: 14 % (ref 11.5–15.5)
WBC: 14.3 10*3/uL — ABNORMAL HIGH (ref 4.0–10.5)

## 2016-10-28 LAB — FACTOR 5 LEIDEN

## 2016-10-28 LAB — PROTHROMBIN GENE MUTATION

## 2016-10-28 MED ORDER — APIXABAN 5 MG PO TABS: 5.0000 mg | ORAL_TABLET | Freq: Two times a day (BID) | ORAL | 3 refills | Status: DC

## 2016-10-28 MED ORDER — APIXABAN 5 MG PO TABS: 10.0000 mg | ORAL_TABLET | Freq: Two times a day (BID) | ORAL | 0 refills | Status: DC

## 2016-10-28 MED ORDER — LISINOPRIL 5 MG PO TABS: 2.5000 mg | ORAL_TABLET | Freq: Every day | ORAL | Status: DC

## 2016-10-28 NOTE — Discharge Summary (Addendum)
Physician Discharge Summary  Faith Thomas XLK:[id number] DOB: 1960/06/23 DOA: 10/26/2016  PCP: Andi Devon, MD  Admit date: 10/26/2016 Discharge date: 10/28/2016  Admitted From: home Disposition:  Home  Recommendations for Outpatient Follow-up:  1. Follow up with Hematologist in 1-2 weeks, history of MTHFR homocysteine gene mutation.  Home Health:No Equipment/Devices:None  Discharge Condition:stable CODE STATUS:full Diet recommendation: Heart Healthy  Brief/Interim Summary:  Discharge Diagnoses:  Principal Problem:   Pulmonary embolism (HCC) Active Problems:   Chronic back pain   Anxiety   Fibromyalgia   Hypoxemia  Submassive PE: She was started on IV heparin, critical care was consulted they recommended no thrombolytics as her vitals were stable. No extremity Dopplers show small DVT. Hypercoagulable panel was ordered which is pending at this time. She'll need to follow-up with hematology as an outpatient.  Chronic back pain/fibromyalgia: Continue prednisone and tramadol changes made to her medication.  Anxiety: Continue current regimen.  Discharge Instructions  Discharge Instructions    Diet - low sodium heart healthy    Complete by:  As directed    Increase activity slowly    Complete by:  As directed      Allergies as of 10/28/2016      Reactions   Bee Venom    Citalopram Tinitus   Duloxetine Other (See Comments)   Escitalopram Oxalate Tinitus   Latex    Penicillins Swelling, Rash   Has patient had a PCN reaction causing immediate rash, facial/tongue/throat swelling, SOB or lightheadedness with hypotension: Yes Has patient had a PCN reaction causing severe rash involving mucus membranes or skin necrosis: No Has patient had a PCN reaction that required hospitalization: No Has patient had a PCN reaction occurring within the last 10 years: No If all of the above answers are "NO", then may proceed with Cephalosporin use.      Medication List     STOP taking these medications   naproxen sodium 220 MG tablet Commonly known as:  ANAPROX     TAKE these medications   ALPRAZolam 0.5 MG tablet Commonly known as:  XANAX Take 0.5 mg by mouth at bedtime as needed for anxiety.   apixaban 5 MG Tabs tablet Commonly known as:  ELIQUIS Take 2 tablets (10 mg total) by mouth 2 (two) times daily with a meal.   apixaban 5 MG Tabs tablet Commonly known as:  ELIQUIS Take 1 tablet (5 mg total) by mouth 2 (two) times daily with a meal. Start taking on:  11/03/2016   b complex vitamins tablet Take 1 tablet by mouth daily.   busPIRone 30 MG tablet Commonly known as:  BUSPAR Take 30 mg by mouth 2 (two) times daily.   Chromium 400 MCG Tabs Take 400 mcg by mouth 2 (two) times daily.   clonazePAM 0.5 MG tablet Commonly known as:  KLONOPIN Take 0.5 mg by mouth at bedtime.   CURCUMIN 95 PO Take 1,200 mg by mouth at bedtime.   erythromycin ophthalmic ointment Place 1 application into both eyes 3 (three) times daily.   L-Methylfolate 15 MG Tabs Take 1 tablet by mouth daily.   L-Theanine 100 MG Caps Take 200 mg by mouth at bedtime.   LACTOBACILLUS BIFIDUS PO Take 1 capsule by mouth 3 (three) times a week.   lisinopril 5 MG tablet Commonly known as:  PRINIVIL,ZESTRIL Take 0.5 tablets (2.5 mg total) by mouth daily. Start taking on:  11/03/2016 What changed:  These instructions start on 11/03/2016. If you are unsure what to do until  then, ask your doctor or other care provider.   methocarbamol 500 MG tablet Commonly known as:  ROBAXIN Take 1,000 mg by mouth daily.   Omega 3 1200 MG Caps Take 1,200 mg by mouth daily.   predniSONE 20 MG tablet Commonly known as:  DELTASONE Take 20 mg by mouth 2 (two) times daily with a meal.   ranitidine 150 MG tablet Commonly known as:  ZANTAC Take 150 mg by mouth daily.   SAM-e 400 MG Tabs Take 400 mg by mouth daily.   traMADol 50 MG tablet Commonly known as:  ULTRAM Take 50 mg by mouth 4  (four) times daily as needed for pain.   traZODone 50 MG tablet Commonly known as:  DESYREL Take 100 mg by mouth at bedtime as needed for sleep.   TRINTELLIX 10 MG Tabs Generic drug:  vortioxetine HBr Take 1 tablet by mouth daily.   vitamin C 500 MG tablet Commonly known as:  ASCORBIC ACID Take 500 mg by mouth daily.   Vitamin D-3 5000 units Tabs Take by mouth daily.      Follow-up Information    [facility] .        Si Gaul, MD. Schedule an appointment as soon as possible for a visit in 1 week(s).   Specialty:  Oncology Contact information: [address] [place] [address] [phone]          Allergies  Allergen Reactions  . Bee Venom   . Citalopram Tinitus  . Duloxetine Other (See Comments)  . Escitalopram Oxalate Tinitus  . Latex   . Penicillins Swelling and Rash    Has patient had a PCN reaction causing immediate rash, facial/tongue/throat swelling, SOB or lightheadedness with hypotension: Yes Has patient had a PCN reaction causing severe rash involving mucus membranes or skin necrosis: No Has patient had a PCN reaction that required hospitalization: No Has patient had a PCN reaction occurring within the last 10 years: No If all of the above answers are "NO", then may proceed with Cephalosporin use.    Consultations:  PCCM   Procedures/Studies: Dg Chest 2 View  Result Date: 10/26/2016 CLINICAL DATA:  Shortness of breath for 2 days EXAM: CHEST  2 VIEW COMPARISON:  None. FINDINGS: The heart size and mediastinal contours are within normal limits. Both lungs are clear. The visualized skeletal structures are unremarkable. IMPRESSION: No active cardiopulmonary disease. Electronically Signed   By: Alcide Clever M.D.   On: 10/26/2016 07:39   Ct Angio Chest Pe W/cm &/or Wo Cm  Result Date: 10/26/2016 CLINICAL DATA:  Shortness of breath. EXAM: CT ANGIOGRAPHY CHEST WITH CONTRAST TECHNIQUE: Multidetector CT imaging of the chest  was performed using the standard protocol during bolus administration of intravenous contrast. Multiplanar CT image reconstructions and MIPs were obtained to evaluate the vascular anatomy. CONTRAST:  Isovue 370. Contrast dose not documented by technologist. COMPARISON:  Oct 26, 2016 FINDINGS: Cardiovascular: There is a large volume of pulmonary embolus involving all lobes. Numerous segmental branches are involved. There is mild extension into the right main pulmonary artery. No saddle embolus. The thoracic aorta is normal in caliber with no identified dissection. The heart is normal in size. The right ventricle diameter is 4 cm and the left is 3.6 cm with a RV/LV ratio of 1.11 Mediastinum/Nodes: No enlarged mediastinal, hilar, or axillary lymph nodes. Thyroid gland, trachea, and esophagus demonstrate no significant findings. Lungs/Pleura: Central airways are normal. No pneumothorax. No pulmonary nodules, masses, or pulmonary infiltrates. Upper Abdomen:  No acute abnormality. Musculoskeletal: No chest wall abnormality. No acute or significant osseous findings. Review of the MIP images confirms the above findings. IMPRESSION: 1. Large pulmonary embolus burden affecting all lobes with an RV/LV ratio of 1.11 consistent with right heart strain. Positive for acute PE with CT evidence of right heart strain (RV/LV Ratio = 1.11) consistent with at least submassive (intermediate risk) PE. The presence of right heart strain has been associated with an increased risk of morbidity and mortality. Please activate Code PE by paging [phone]. Findings discussed with Dr. Donnald GarrePfeiffer. Electronically Signed   By: Gerome Samavid  Williams III M.D   On: 10/26/2016 09:42     Subjective: She is complaining of everything from the admission to discharge from the nurse to the cleaning staff, obviously she is complaining about me to other staff members.  Discharge Exam: Vitals:   10/27/16 2031 10/28/16 0529  BP: 127/90 (!) 148/98  Pulse: 96  73  Resp: 18 18  Temp: 98.1 F (36.7 C) 97.9 F (36.6 C)   Vitals:   10/27/16 0452 10/27/16 1527 10/27/16 2031 10/28/16 0529  BP: 114/78 (!) 131/99 127/90 (!) 148/98  Pulse: 78 (!) 101 96 73  Resp: 18 18 18 18   Temp: 97.9 F (36.6 C) 99.4 F (37.4 C) 98.1 F (36.7 C) 97.9 F (36.6 C)  TempSrc: Oral Oral Oral Oral  SpO2: 100% 96% 98% 100%  Weight:      Height:        General: In no acute distress Cardiovascular: Regular rate and rhythm positive S1 and S2 Respiratory: Good air movement and clear to auscultation Abdominal: Positive bowel sounds soft nontender Extremities: No edema.    The results of significant diagnostics from this hospitalization (including imaging, microbiology, ancillary and laboratory) are listed below for reference.     Microbiology: No results found for this or any previous visit (from the past 240 hour(s)).   Labs: BNP (last 3 results) No results for input(s): BNP in the last 8760 hours. Basic Metabolic Panel:  Recent Labs Lab 10/26/16 0745 10/27/16 0116  NA 136 137  K 4.2 4.0  CL 101 100*  CO2 24 28  GLUCOSE 133* 105*  BUN 17 17  CREATININE 0.86 0.83  CALCIUM 9.4 9.3   Liver Function Tests:  Recent Labs Lab 10/27/16 0116  AST 28  ALT 30  ALKPHOS 43  BILITOT 0.6  PROT 6.4*  ALBUMIN 3.9   No results for input(s): LIPASE, AMYLASE in the last 168 hours. No results for input(s): AMMONIA in the last 168 hours. CBC:  Recent Labs Lab 10/26/16 0745 10/27/16 0116 10/28/16 0539  WBC 15.2* 18.9* 14.3*  NEUTROABS 12.8*  --   --   HGB 13.0 14.3 15.7*  HCT 37.8 41.6 45.0  MCV 96.4 96.5 96.6  PLT 223 262 285   Cardiac Enzymes:  Recent Labs Lab 10/26/16 1232  CKTOTAL 59   BNP: Invalid input(s): POCBNP CBG: No results for input(s): GLUCAP in the last 168 hours. D-Dimer  Recent Labs  10/26/16 0745  DDIMER 5.97*   Hgb A1c No results for input(s): HGBA1C in the last 72 hours. Lipid Profile No results for  input(s): CHOL, HDL, LDLCALC, TRIG, CHOLHDL, LDLDIRECT in the last 72 hours. Thyroid function studies  Recent Labs  10/26/16 0746  TSH 2.276   Anemia work up No results for input(s): VITAMINB12, FOLATE, FERRITIN, TIBC, IRON, RETICCTPCT in the last 72 hours. Urinalysis    Component Value Date/Time   COLORURINE YELLOW 10/26/2016  0840   APPEARANCEUR CLEAR 10/26/2016 0840   LABSPEC 1.005 10/26/2016 0840   PHURINE 7.0 10/26/2016 0840   GLUCOSEU NEGATIVE 10/26/2016 0840   HGBUR NEGATIVE 10/26/2016 0840   BILIRUBINUR NEGATIVE 10/26/2016 0840   KETONESUR NEGATIVE 10/26/2016 0840   PROTEINUR NEGATIVE 10/26/2016 0840   NITRITE NEGATIVE 10/26/2016 0840   LEUKOCYTESUR NEGATIVE 10/26/2016 0840   Sepsis Labs Invalid input(s): PROCALCITONIN,  WBC,  LACTICIDVEN Microbiology No results found for this or any previous visit (from the past 240 hour(s)).   Time coordinating discharge: Over 30 minutes  SIGNED:   Marinda Elk, MD  [facility] 10/28/2016, 9:06 AM Pager   If 7PM-7AM, please contact night-coverage [email or web address] Password [email or web address]

## 2016-10-28 NOTE — Care Management Note (Signed)
Case Management Note  Patient Details  Name: Faith Thomas MRN: [id number] Date of Birth: February 23, 1961  Subjective/Objective:    PE                Action/Plan: Discharge Planning: Provided pt with Eliquis 30 day free trial card and copay card. Contacted [facility] pharmacy and they do have in stock. Pt had questions regarding Eliquis loading dose for 7 days that [facility].  NCM notified attending to discuss with pt.   PCP Andi DevonSHELTON, KIMBERLY MD  Expected Discharge Date:  10/28/16               Expected Discharge Plan:  Home/Self Care  In-House Referral:  NA  Discharge planning Services  CM Consult, Medication Assistance  Post Acute Care Choice:  NA Choice offered to:  NA  DME Arranged:  N/A DME Agency:  NA  HH Arranged:  NA HH Agency:  NA  Status of Service:  Completed, signed off  If discussed at Long Length of Stay Meetings, dates discussed:    Additional Comments:  Elliot CousinShavis, Deshaun Weisinger Ellen, RN  [phone] 10/28/2016, 10:30 AM

## 2016-10-28 NOTE — Progress Notes (Signed)
Patient Discharged Home. Discharge instructions  provided to the patient which included instructions that addressed activity level, diet, discharge medications, follow-up appointments, weight monitoring and what to do if symptoms worsen. All patients Questions answered.   

## 2016-10-31 LAB — HOMOCYSTEINE: Homocysteine: 8 umol/L (ref 0.0–15.0)

## 2016-11-01 LAB — ANTIPHOSPHOLIPID SYNDROME EVAL, BLD
Anticardiolipin IgA: <9 APL U/mL (ref 0–11)
Anticardiolipin IgM: <9 MPL U/mL (ref 0–12)
DRVVT: 34.8 s (ref 0.0–47.0)
PHOSPHATYDALSERINE, IGG: 2 {GPS'U} (ref 0–11)
PHOSPHATYDALSERINE, IGM: 4 {MPS'U} (ref 0–25)
PTT Lupus Anticoagulant: 32.6 s (ref 0.0–51.9)
Phosphatydalserine, IgA: <1 APS IgA (ref 0–20)

## 2016-11-06 ENCOUNTER — Other Ambulatory Visit: Payer: Self-pay | Admitting: Internal Medicine

## 2016-11-06 DIAGNOSIS — R221 Localized swelling, mass and lump, neck: Secondary | ICD-10-CM

## 2016-11-28 ENCOUNTER — Ambulatory Visit (INDEPENDENT_AMBULATORY_CARE_PROVIDER_SITE_OTHER): Payer: PRIVATE HEALTH INSURANCE | Admitting: Family Medicine

## 2016-11-28 ENCOUNTER — Encounter: Payer: Self-pay | Admitting: Family Medicine

## 2016-11-28 VITALS — BP 108/78 | HR 86 | Temp 98.6°F | Ht 64.75 in | Wt 128.8 lb

## 2016-11-28 DIAGNOSIS — E7212 Methylenetetrahydrofolate reductase deficiency: Secondary | ICD-10-CM | POA: Insufficient documentation

## 2016-11-28 DIAGNOSIS — I2609 Other pulmonary embolism with acute cor pulmonale: Secondary | ICD-10-CM | POA: Diagnosis not present

## 2016-11-28 DIAGNOSIS — R5382 Chronic fatigue, unspecified: Secondary | ICD-10-CM

## 2016-11-28 DIAGNOSIS — M5440 Lumbago with sciatica, unspecified side: Secondary | ICD-10-CM

## 2016-11-28 DIAGNOSIS — F419 Anxiety disorder, unspecified: Secondary | ICD-10-CM | POA: Diagnosis not present

## 2016-11-28 DIAGNOSIS — Q999 Chromosomal abnormality, unspecified: Secondary | ICD-10-CM

## 2016-11-28 DIAGNOSIS — Z1589 Genetic susceptibility to other disease: Secondary | ICD-10-CM

## 2016-11-28 DIAGNOSIS — F339 Major depressive disorder, recurrent, unspecified: Secondary | ICD-10-CM

## 2016-11-28 DIAGNOSIS — G4700 Insomnia, unspecified: Secondary | ICD-10-CM

## 2016-11-28 DIAGNOSIS — G8929 Other chronic pain: Secondary | ICD-10-CM

## 2016-11-28 HISTORY — DX: Genetic susceptibility to other disease: Z15.89

## 2016-11-28 NOTE — Patient Instructions (Signed)
BEFORE YOU LEAVE: -follow up: 1-2 months  -We placed a referral for you as discussed to hematology. It usually takes about 1-2 weeks to process and schedule this referral. If you have not heard from us regarding this appointment in 2 weeks please contact our office.  - call your hematologist about the vitamin question  - ask your psychiatrist about the benzo medications for sleep  FOR IMPROVED SLEEP AND TO RESET YOUR SLEEP SCHEDULE: []  exercise 30 minutes daily  []  go to bed and wake up at the same time  []  keep bedroom cool, dark and quiet  []  reserve bed for sleep - do not read, watch TV, etc in bed  []  If you toss and turn more then 15-20 minutes get out of bed and list thoughts/do quite activity then go back to bed; repeat as needed; do not worry about when you eventually fall asleep - still get up at the same time and turn on lights and take shower  [] get counseling  []  some people find that a half dose of benadryl, lavender, valerian, melatonin, tylenol pm or unisom on a few nights per week is helpful initially for a few weeks  [] seek help and treat any depression or anxiety  [] prescription strength sleep medications should only be used in severe cases of insomnia if other measures fail and should be used sparingly

## 2016-11-28 NOTE — Progress Notes (Addendum)
HPI:  Faith Thomas is here to establish care. Reports that she needs a physician whom listens and can partner with her. Wants to "find out what is actually wrong with me: viral? Autoimmune? Genetic? Mental health? Spinal?" Wants to decrease medications if possible. Would like to be off of controlled medications. Balance western and alternative medicines. She has a complicated health history. Was seeing Dr. Renae Gloss.  Sees Dr. Alben Spittle - [facility] Dr. Marikay Alar - [facility]  Many symptoms (see long list scanned in): primary seem to be pain, anxiety, depression, dizziness, cognitive, fatigue per her treatment goals list  Has tried many treatments medications, exercise,  vitamins, supplements, diet, writing, yoga, chiropractic, rolfing, tens, traction, changes in life situations (work, home, etc) - variable results.  Had "extermely inflammatory response" to acupuncture.  Has a history of yeast inf, TB - treated (1988), strep B, lichen sclerosis, sinus infections, herpes, hpv remotely, eye infection.  T[place] did graciously bring a summary of her treatment goals, symptom list and treatments and infectious illnesses she had in the past. These I have reviewed and placed in the can box to upload into EPIC. We did not get to talk about everything today given time limitations. Mainly discussed the following summarized below:  Hx Pulm Embolism/DVT in 2018: -credits dx to her psychiatrist whom did genetic testing and found MTHFR mutation which made pt think that panic attacks were maybe a blood clot and resulted in diagnosis -saw hematology at [facility] - not happy with care (seems questions were not answered and the doctor did not allow her to follow up) - wants to see hematology again, tried to schedule with [facility] but calls were not returned -may see geneticist at [facility] -on elequis -MTHFR homozygous mutation on testing  with psychiatrist- has questions about this, implications, blood clots, blood thinners, what to expect -doing walking 30 minutes, B vitamins and eliquis  Chronic fatigue, chronic muscle (back pain), DDD, R knee pain, R elbow: -seeing physical therapist - integrative therapy, happy with these treatments -chronic, but worse after taking progesterone (2008) (reports for years did well on this medication, then when went into menopause this medication caused issues according to an integrative doctor she saw)  Anxiety, Insomnia and Depression: -chronic, > 10 years -better after stopping progesterone -managed by [facility] - sees nurse practitioner -meds: deplen algo oil (sp?)(folic acid), trintellix, buspar, benzos -credits psychiatrist as saving her life by diagnosing MTHFR mutation which lead to dx of blood clots and pulmonary embolism -uses klonopin and xanax together to help sleep - does not want to take these if possible and uses them sparingly, wants to know about herbal treatments for sleep. Has difficulty falling asleep and maintaining sleep. -seeing a counselor as well  ROS negative for unless reported above: fevers, unintentional weight loss, hearing or vision loss, chest pain, palpitations, struggling to breath, hemoptysis, melena, hematochezia, hematuria, falls, loc, si, thoughts of self harm  Past Medical History:  Diagnosis Date  . Allergy   . Depression   . Fibromyalgia   . Genetic defect   . GERD (gastroesophageal reflux disease)   . Hypertension   . Positive TB test   . Urine incontinence     No past surgical history on file.  Family History  Problem Relation Age of Onset  . Arthritis Mother   . Hypertension Mother   . Mental illness Mother   . Arthritis Father   . Hypertension Father   .  Mental illness Father   . Mental illness Brother   . Arthritis Daughter   . Cancer Maternal Aunt   . Hypertension Maternal Grandmother   . Arthritis Maternal  Grandfather     Social History   Social History  . Marital status: Divorced    Spouse name: N/A  . Number of children: N/A  . Years of education: N/A   Social History Main Topics  . Smoking status: Never Smoker  . Smokeless tobacco: Never Used  . Alcohol use No  . Drug use: No  . Sexual activity: Not Asked   Other Topics Concern  . 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  acetaminophen (TYLENOL) 500 MG tablet, Take 500 mg by mouth 3 (three) times daily., Disp: , Rfl:  .  ALPRAZolam (XANAX) 0.5 MG tablet, Take 0.5 mg by mouth at bedtime as needed for anxiety., Disp: , Rfl:  .  apixaban (ELIQUIS) 5 MG TABS tablet, Take 2 tablets (10 mg total) by mouth 2 (two) times daily with a meal. (Patient taking differently: Take 5 mg by mouth 2 (two) times daily with a meal. ), Disp: 12 tablet, Rfl: 0 .  busPIRone (BUSPAR) 30 MG tablet, Take 30 mg by mouth 2 (two) times daily., Disp: , Rfl:  .  Cholecalciferol (VITAMIN D-3) 5000 UNITS TABS, Take 1,000 Units by mouth daily. , Disp: , Rfl:  .  clonazePAM (KLONOPIN) 0.5 MG tablet, Take 0.5 mg by mouth at bedtime., Disp: , Rfl:  .  L-Theanine 100 MG CAPS, Take 200 mg by mouth at bedtime., Disp: , Rfl:  .  lisinopril (PRINIVIL,ZESTRIL) 5 MG tablet, Take 0.5 tablets (2.5 mg total) by mouth daily. (Patient taking differently: Take 10 mg by mouth daily. ), Disp: , Rfl:  .  Methylcobalamin (METHYL B-12 PO), Take by mouth., Disp: , Rfl:  .  NON FORMULARY, Mood Systems Balance, Disp: , Rfl:  .  Omega 3 1200 MG CAPS, Take 1,200 mg by mouth daily., Disp: , Rfl:  .  PRESCRIPTION MEDICATION, [place]in-Algal oil 15mg  capsule, Disp: , Rfl:  .  Probiotic Product (PROBIOTIC PO), Take by mouth., Disp: , Rfl:  .  ranitidine (ZANTAC) 150 MG  tablet, Take 150 mg by mouth daily., Disp: , Rfl: 5 .  traMADol (ULTRAM) 50 MG tablet, Take 50 mg by mouth 2 (two) times daily. , Disp: , Rfl: 1 .  vitamin C (ASCORBIC ACID) 500 MG tablet, Take 500 mg by mouth daily., Disp: , Rfl:  .  vortioxetine HBr (TRINTELLIX) 20 MG TABS, Take by mouth daily., Disp: , Rfl:   EXAM:  Vitals:   11/28/16 1327  BP: 108/78  Pulse: 86  Temp: 98.6 F (37 C)    Body mass index is 21.6 kg/m.  GENERAL: vitals reviewed and listed above, alert, oriented, appears well hydrated and in no acute distress  MS: moves all extremities without noticeable abnormality  PSYCH: pleasant and cooperative, no obvious depression or anxiety  ASSESSMENT AND PLAN:  Discussed the following assessment and plan:More than 50% of over 30  minutes spent in total in caring for this patient was spent face-to-face with the patient, counseling and/or coordinating care.  Spent additional 1 hour reviewing  and reviewing pt hx and records - summarized in this note and chart PMH, problem list, SH, PFH, etc.   Other acute pulmonary embolism with acute cor pulmonale (HCC) - Plan: Ambulatory referral to Hematology  Homozygous for MTHFR gene mutation (HCC) - Plan: Ambulatory referral to Hematology  Anxiety  Depression, recurrent (HCC)  Chronic fatigue  Chronic low back pain with sciatica, sciatica laterality unspecified, unspecified back pain laterality  Insomnia, unspecified type  Had genetic testing with psychiatry  -Reviewed history as time permitted per above -will place referral to [facility] as she does have many questions a hematologist may be better able to answer and she prefers to transfer to [place] if possible. Asked her to call us if delay in receiving appt details. -see scanned documents -discussed various OTC options that some people find helpful for sleep - I am glad she is seeing a psychiatrist and counselor -she would like to follow up sooner to spend  more time discussing her chronic symptoms and getting to know each other better. She seems interested in considering OMT as well. -reviewed documents she brought with her and placed in scan box to scan into epic  -Patient advised to return or notify a doctor immediately if symptoms worsen or persist or new concerns arise.  Patient Instructions  BEFORE YOU LEAVE: -follow up: 1-2 months  -We placed a referral for you as discussed to hematology. It usually takes about 1-2 weeks to process and schedule this referral. If you have not heard from us regarding this appointment in 2 weeks please contact our office.  - call your hematologist about the vitamin question  - ask your psychiatrist about the benzo medications for sleep  FOR IMPROVED SLEEP AND TO RESET YOUR SLEEP SCHEDULE: []  exercise 30 minutes daily  []  go to bed and wake up at the same time  []  keep bedroom cool, dark and quiet  []  reserve bed for sleep - do not read, watch TV, etc in bed  []  If you toss and turn more then 15-20 minutes get out of bed and list thoughts/do quite activity then go back to bed; repeat as needed; do not worry about when you eventually fall asleep - still get up at the same time and turn on lights and take shower  [] get counseling  []  some people find that a half dose of benadryl, lavender, valerian, melatonin, tylenol pm or unisom on a few nights per week is helpful initially for a few weeks  [] seek help and treat any depression or anxiety  [] prescription strength sleep medications should only be used in severe cases of insomnia if other measures fail and should be used sparingly       Almina Schul, Dahlia ClientHANNAH R.

## 2016-12-04 ENCOUNTER — Telehealth: Payer: Self-pay | Admitting: Family Medicine

## 2016-12-04 ENCOUNTER — Emergency Department (HOSPITAL_BASED_OUTPATIENT_CLINIC_OR_DEPARTMENT_OTHER)
Admission: EM | Admit: 2016-12-04 | Discharge: 2016-12-04 | Disposition: A | Discharge status: Home / Self Care | Payer: PRIVATE HEALTH INSURANCE | Source: Home / Self Care

## 2016-12-04 ENCOUNTER — Encounter (HOSPITAL_COMMUNITY): Payer: Self-pay | Admitting: Emergency Medicine

## 2016-12-04 ENCOUNTER — Emergency Department (HOSPITAL_COMMUNITY): Payer: PRIVATE HEALTH INSURANCE

## 2016-12-04 ENCOUNTER — Other Ambulatory Visit: Payer: Self-pay

## 2016-12-04 ENCOUNTER — Emergency Department (HOSPITAL_COMMUNITY)
Admission: EM | Admit: 2016-12-04 | Discharge: 2016-12-04 | Disposition: A | Discharge status: Home / Self Care | Payer: PRIVATE HEALTH INSURANCE | Attending: Emergency Medicine | Admitting: Emergency Medicine

## 2016-12-04 ENCOUNTER — Ambulatory Visit: Payer: PRIVATE HEALTH INSURANCE | Admitting: Family Medicine

## 2016-12-04 DIAGNOSIS — Z91040 Latex allergy status: Secondary | ICD-10-CM | POA: Diagnosis not present

## 2016-12-04 DIAGNOSIS — R0602 Shortness of breath: Secondary | ICD-10-CM | POA: Diagnosis not present

## 2016-12-04 DIAGNOSIS — I10 Essential (primary) hypertension: Secondary | ICD-10-CM | POA: Insufficient documentation

## 2016-12-04 DIAGNOSIS — Z86718 Personal history of other venous thrombosis and embolism: Secondary | ICD-10-CM | POA: Insufficient documentation

## 2016-12-04 DIAGNOSIS — M79609 Pain in unspecified limb: Secondary | ICD-10-CM

## 2016-12-04 DIAGNOSIS — M79604 Pain in right leg: Secondary | ICD-10-CM | POA: Diagnosis not present

## 2016-12-04 DIAGNOSIS — Z79899 Other long term (current) drug therapy: Secondary | ICD-10-CM | POA: Diagnosis not present

## 2016-12-04 DIAGNOSIS — Z7901 Long term (current) use of anticoagulants: Secondary | ICD-10-CM | POA: Insufficient documentation

## 2016-12-04 HISTORY — DX: Other pulmonary embolism without acute cor pulmonale: I26.99

## 2016-12-04 HISTORY — DX: Acute embolism and thrombosis of unspecified deep veins of unspecified lower extremity: I82.409

## 2016-12-04 LAB — BASIC METABOLIC PANEL
Anion gap: 8 (ref 5–15)
BUN: 10 mg/dL (ref 6–20)
CALCIUM: 9.5 mg/dL (ref 8.9–10.3)
CO2: 29 mmol/L (ref 22–32)
CREATININE: 0.76 mg/dL (ref 0.44–1.00)
Chloride: 102 mmol/L (ref 101–111)
GFR calc Af Amer: >60 mL/min (ref >60)
GLUCOSE: 90 mg/dL (ref 65–99)
POTASSIUM: 3.7 mmol/L (ref 3.5–5.1)
Sodium: 139 mmol/L (ref 135–145)

## 2016-12-04 LAB — CBC WITH DIFFERENTIAL/PLATELET
Basophils Absolute: 0 10*3/uL (ref 0.0–0.1)
Basophils Relative: 0 %
EOS ABS: 0.1 10*3/uL (ref 0.0–0.7)
EOS PCT: 1 %
HCT: 37.9 % (ref 36.0–46.0)
Hemoglobin: 12.9 g/dL (ref 12.0–15.0)
LYMPHS ABS: 3 10*3/uL (ref 0.7–4.0)
Lymphocytes Relative: 50 %
MCH: 33.4 pg (ref 26.0–34.0)
MCHC: 34 g/dL (ref 30.0–36.0)
MCV: 98.2 fL (ref 78.0–100.0)
MONO ABS: 0.5 10*3/uL (ref 0.1–1.0)
MONOS PCT: 8 %
Neutro Abs: 2.5 10*3/uL (ref 1.7–7.7)
Neutrophils Relative %: 41 %
PLATELETS: 308 10*3/uL (ref 150–400)
RBC: 3.86 MIL/uL — ABNORMAL LOW (ref 3.87–5.11)
RDW: 13.3 % (ref 11.5–15.5)
WBC: 6 10*3/uL (ref 4.0–10.5)

## 2016-12-04 LAB — I-STAT TROPONIN, ED: Troponin i, poc: 0 ng/mL (ref 0.00–0.08)

## 2016-12-04 MED ORDER — IOPAMIDOL (ISOVUE-370) INJECTION 76%
100.0000 mL | Freq: Once | INTRAVENOUS | Status: AC | PRN
  Administered 2016-12-04: 85 mL via INTRAVENOUS

## 2016-12-04 MED ORDER — IOPAMIDOL (ISOVUE-370) INJECTION 76%
INTRAVENOUS | Status: AC
  Filled 2016-12-04: qty 100

## 2016-12-04 NOTE — Medical Student Note (Signed)
[facility]-EMERGENCY DEPT Provider Student Note For educational purposes for Medical, PA and NP students only and not part of the legal medical record.   CSN: [id number] Arrival date & time: 12/04/16  1150     History   Chief Complaint Chief Complaint  Patient presents with  . Leg Pain    HPI Faith Thomas is a 56 y.o. female with past medical history significant for PE, DVT, HTN, chronic low back pain, and fibromyalgia who presents today with right leg pain. She states pain has persisted for 5 days and is constant. Patient states pain is worse when sitting for a long time. Pain is located in the right glute. Patient had PE in May and has been on Eliquis since then. She is concerned that this may be another PE. Patient admits to upper thoracic back pain today and SOB when walking outside and upstairs. She denies any chest pain, abdominal pain, nausea, vomiting, and warmth or erythema of legs.     Leg Pain      Past Medical History:  Diagnosis Date  . Allergy   . Depression   . DVT (deep venous thrombosis) (HCC)   . Fibromyalgia   . Genetic defect   . GERD (gastroesophageal reflux disease)   . Hypertension   . PE (pulmonary thromboembolism) (HCC)   . Positive TB test   . Urine incontinence     Patient Active Problem List   Diagnosis Date Noted  . 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 Chronic fatigue 11/28/2016  . Had genetic testing with psychiatry 11/28/2016  . Hypoxemia   . Pulmonary embolism (HCC) 10/26/2016  . Chronic back pain 10/26/2016  . Anxiety 10/26/2016  . Fibromyalgia 10/26/2016  . Metatarsalgia of both feet 04/24/2014  . Metatarsal deformity 04/24/2014    History reviewed. No pertinent surgical history.  OB History    No data available       Home Medications    Prior to Admission medications   Medication Sig Start Date End Date Taking? Authorizing Provider  ALPRAZolam Prudy Feeler) 0.5 MG tablet Take 0.25 mg by mouth at bedtime  as needed for anxiety.    Yes [provider]  apixaban (ELIQUIS) 5 MG TABS tablet Take 2 tablets (10 mg total) by mouth 2 (two) times daily with a meal. Patient taking differently: Take 5 mg by mouth daily.  10/28/16  Yes Marinda Elk, MD  busPIRone (BUSPAR) 30 MG tablet Take 30 mg by mouth 2 (two) times daily.   Yes [provider]  Cholecalciferol (VITAMIN D-3) 1000 units CAPS Take 1,000 Units by mouth daily.    Yes [provider]  lisinopril (PRINIVIL,ZESTRIL) 10 MG tablet Take 10 mg by mouth daily. 11/30/16  Yes [provider]  Omega 3 1200 MG CAPS Take 1,200 mg by mouth daily.   Yes [provider]  OVER THE COUNTER MEDICATION Take 2-3 tablets by mouth at bedtime as needed (sleep). "Luna" herbal sleep aid supplement   Yes [provider]  Probiotic Product (PROBIOTIC PO) Take 1 capsule by mouth daily.    Yes [provider]  ranitidine (ZANTAC) 150 MG tablet Take 150 mg by mouth daily. 09/29/16  Yes [provider]  traMADol (ULTRAM) 50 MG tablet Take 50 mg by mouth 3 (three) times daily as needed for moderate pain.  10/08/16  Yes [provider]  vitamin C (ASCORBIC ACID) 500 MG tablet Take 1,000 mg by mouth daily.  Yes [provider]  acetaminophen (TYLENOL) 500 MG tablet Take 500 mg by mouth 3 (three) times daily.    [provider]  PRESCRIPTION MEDICATION Deplin-Algal oil 15mg  capsule    [provider]    Family History Family History  Problem Relation Age of Onset  . Arthritis Mother   . Hypertension Mother   . Mental illness Mother   . Arthritis Father   . Hypertension Father   . Mental illness Father   . Mental illness Brother   . Arthritis Daughter   . Cancer Maternal Aunt   . Hypertension Maternal Grandmother   . Arthritis Maternal Grandfather     Social History Social History  Substance Use Topics  . Smoking status: Never Smoker  . Smokeless  tobacco: Never Used  . Alcohol use 0.0 oz/week     Comment: occasional      Allergies   Bee venom; Citalopram; Cymbalta [duloxetine hcl]; Duloxetine; Escitalopram oxalate; Latex; Other; Progesterone; Trintellix [vortioxetine]; and Penicillins   Review of Systems Review of Systems  Respiratory: Positive for shortness of breath. Negative for chest tightness.   Cardiovascular: Negative for chest pain, palpitations and leg swelling.  Gastrointestinal: Negative for abdominal pain, nausea and vomiting.  Musculoskeletal: Positive for back pain.     Physical Exam Updated Vital Signs BP (!) 151/96 (BP Location: Right Arm)   Pulse 70   Temp 98.2 F (36.8 C) (Oral)   Resp 18   SpO2 100%   Physical Exam  Constitutional: She is oriented to person, place, and time. She appears well-developed and well-nourished.  HENT:  Head: Normocephalic and atraumatic.  Eyes: Conjunctivae are normal.  Neck: Normal range of motion. Neck supple.  Cardiovascular: Normal rate and regular rhythm.   No murmur heard. Pulmonary/Chest: Effort normal and breath sounds normal. No respiratory distress.  Abdominal: Soft. She exhibits no distension.  Musculoskeletal: Normal range of motion. She exhibits no edema or tenderness.  Neurological: She is alert and oriented to person, place, and time.  Skin: Skin is warm and dry.  Psychiatric: She has a normal mood and affect.     ED Treatments / Results  Labs (all labs ordered are listed, but only abnormal results are displayed) Labs Reviewed  CBC WITH DIFFERENTIAL/PLATELET  BASIC METABOLIC PANEL  I-STAT TROPOININ, ED    EKG  EKG Interpretation None       Radiology No results found.  Procedures Procedures (including critical care time)  Medications Ordered in ED Medications - No data to display   Initial Impression / Assessment and Plan / ED Course  I have reviewed the triage vital signs and the nursing notes.  Pertinent labs & imaging  results that were available during my care of the patient were reviewed by me and considered in my medical decision making (see chart for details).   With patient's history I would like to rule out DVT and PE. Patient has chronic back pain and fibromyalgia which could be contributing to some of her symptoms today. Patient also has a history of anxiety.   Doppler US of bilateral lower extremities was performed and showed no evidence of DVT. Patient is to have an EKG performed, troponin, CBC, CMP, and chest x-ray.     Final Clinical Impressions(s) / ED Diagnoses   Final diagnoses:  None    New Prescriptions New Prescriptions   No medications on file

## 2016-12-04 NOTE — ED Notes (Signed)
Provided patient an ice pack and ice water.

## 2016-12-04 NOTE — ED Notes (Signed)
Bed: WA21 Expected date:  Expected time:  Means of arrival:  Comments: Pt in triage

## 2016-12-04 NOTE — Progress Notes (Signed)
**  Preliminary report by tech**  Right lower extremity venous duplex complete. There is no evidence of deep or superficial vein thrombosis involving the right lower extremity. All visualized vessels appear patent and compressible. There is no evidence of a Baker's cyst on the right. [facility].  12/04/16 4:03 PM Olen CordialGreg Jarry Manon RVT

## 2016-12-04 NOTE — Telephone Encounter (Signed)
Pt states she has a history of pulmonary blood clots in her leg.  Pt states leg feels the same way now as it did when she had first one. Would like to know if it is possible for her to have blood clots even if she is on blood thinners?  Pt would like to know what to do.  Please leave message on voice mail and she will call right back. Unable to answer phone at work.

## 2016-12-04 NOTE — Telephone Encounter (Signed)
I called the pt and informed her of the message below.  Patient stated she called the hematologist and is awaiting a call back from their office.  Patient stated she is having leg pain only and an appt was scheduled with Dr Selena BattenKim for 3:30pm today.

## 2016-12-04 NOTE — ED Triage Notes (Signed)
Patient reports PMH PE in May and on Eliquis Patient reports that she been having posterior right upper leg pain which is under right buttock for 5 days. Patient reports that where her pain was prior when had DVT in lower right leg.

## 2016-12-04 NOTE — ED Notes (Signed)
Provider is at bedside, ultrasound leg.

## 2016-12-04 NOTE — Telephone Encounter (Signed)
Would advise she contact the hematologist she saw recently. I know she plans to see another hematologist - but for now would contact the one she saw. If having SOB or CP should go to hospital. Otherwise advise appt.

## 2016-12-04 NOTE — ED Provider Notes (Signed)
[facility] Provider Note   CSN: [id number] Arrival date & time: 12/04/16  1150     History   Chief Complaint Chief Complaint  Patient presents with  . Leg Pain    HPI Faith Thomas is a 56 y.o. female.  HPI   Pt with hx PE/DVT on eliquis, fibromyalgia, chronic back pain p/w increase in SOB x 1 week and new right upper posterior leg pain x 5 days.  States the right leg pain feels similar to prior DVT.  SOB occurred when the weather became very hot but today she also had SOB with walking upstairs inside.  She is concerned that she might have a new PE.  Denies fevers, cough, chest pain, abdominal pain, vomiting, diarrhea.  No leg swelling.    Past Medical History:  Diagnosis Date  . Allergy   . Depression   . DVT (deep venous thrombosis) (HCC)   . Fibromyalgia   . Genetic defect   . GERD (gastroesophageal reflux disease)   . Hypertension   . PE (pulmonary thromboembolism) (HCC)   . Positive TB test   . Urine incontinence     Patient Active Problem List   Diagnosis Date Noted  . 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 Chronic fatigue 11/28/2016  . Had genetic testing with psychiatry 11/28/2016  . Hypoxemia   . Pulmonary embolism (HCC) 10/26/2016  . Chronic back pain 10/26/2016  . Anxiety 10/26/2016  . Fibromyalgia 10/26/2016  . Metatarsalgia of both feet 04/24/2014  . Metatarsal deformity 04/24/2014    History reviewed. No pertinent surgical history.  OB History    No data available       Home Medications    Prior to Admission medications   Medication Sig Start Date End Date Taking? Authorizing Provider  acetaminophen (TYLENOL) 500 MG tablet Take 1,000 mg by mouth 3 (three) times daily.    Yes [provider]  ALPRAZolam Prudy Feeler) 0.5 MG tablet Take 0.25 mg by mouth at bedtime as needed for anxiety.    Yes [provider]  apixaban (ELIQUIS) 5 MG TABS tablet Take 2 tablets (10 mg total) by mouth 2 (two) times  daily with a meal. Patient taking differently: Take 5 mg by mouth 2 (two) times daily.  10/28/16  Yes Marinda Elk, MD  busPIRone (BUSPAR) 30 MG tablet Take 30 mg by mouth 2 (two) times daily.   Yes [provider]  Cholecalciferol (VITAMIN D-3) 1000 units CAPS Take 1,000 Units by mouth daily.    Yes [provider]  Cyanocobalamin (B-12 PO) Take 1 tablet by mouth daily.   Yes [provider]  lisinopril (PRINIVIL,ZESTRIL) 10 MG tablet Take 10 mg by mouth daily. 11/30/16  Yes [provider]  Omega 3 1200 MG CAPS Take 1,200 mg by mouth daily.   Yes [provider]  OVER THE COUNTER MEDICATION Take 2-3 tablets by mouth at bedtime as needed (sleep). "Luna" herbal sleep aid supplement   Yes [provider]  OVER THE COUNTER MEDICATION Take 1 tablet by mouth 2 (two) times daily. "Mood systems balance"   Yes [provider]  PRESCRIPTION MEDICATION Deplin-Algal oil 15mg  capsule   Yes [provider]  Probiotic Product (PROBIOTIC PO) Take 1 capsule by mouth daily.    Yes [provider]  ranitidine (ZANTAC) 150 MG tablet Take 150 mg by mouth daily. 09/29/16  Yes [provider]  traMADol (ULTRAM) 50 MG tablet Take 50 mg by mouth  3 (three) times daily as needed for moderate pain.  10/08/16  Yes [provider]  traZODone (DESYREL) 50 MG tablet Take 50 mg by mouth at bedtime.   Yes [provider]  vitamin C (ASCORBIC ACID) 500 MG tablet Take 1,000 mg by mouth daily.    Yes [provider]    Family History Family History  Problem Relation Age of Onset  . Arthritis Mother   . Hypertension Mother   . Mental illness Mother   . Arthritis Father   . Hypertension Father   . Mental illness Father   . Mental illness Brother   . Arthritis Daughter   . Cancer Maternal Aunt   . Hypertension Maternal Grandmother   . Arthritis Maternal Grandfather     Social History Social History    Substance Use Topics  . Smoking status: Never Smoker  . Smokeless tobacco: Never Used  . Alcohol use 0.0 oz/week     Comment: occasional      Allergies   Bee venom; Citalopram; Cymbalta [duloxetine hcl]; Duloxetine; Escitalopram oxalate; Latex; Other; Progesterone; Trintellix [vortioxetine]; and Penicillins   Review of Systems Review of Systems  All other systems reviewed and are negative.    Physical Exam Updated Vital Signs BP (!) 140/96 (BP Location: Right Arm)   Pulse 67   Temp 98.7 F (37.1 C) (Oral)   Resp 14   SpO2 100%   Physical Exam  Constitutional: She appears well-developed and well-nourished. No distress.  HENT:  Head: Normocephalic and atraumatic.  Neck: Neck supple.  Cardiovascular: Normal rate and regular rhythm.   Pulmonary/Chest: Effort normal and breath sounds normal. No respiratory distress. She has no wheezes. She has no rales.  Abdominal: Soft. She exhibits no distension. There is no tenderness. There is no rebound and no guarding.  Musculoskeletal: She exhibits no edema.  Full AROM of right hip and leg.  No edema or tenderness.    Neurological: She is alert.  Skin: She is not diaphoretic.  Nursing note and vitals reviewed.    ED Treatments / Results  Labs (all labs ordered are listed, but only abnormal results are displayed) Labs Reviewed  CBC WITH DIFFERENTIAL/PLATELET - Abnormal; Notable for the following:       Result Value   RBC 3.86 (*)    All other components within normal limits  BASIC METABOLIC PANEL  I-STAT TROPOININ, ED    EKG  EKG Interpretation None       Radiology Ct Angio Chest Pe W/cm &/or Wo Cm  Result Date: 12/04/2016 CLINICAL DATA:  56 year old female with chest and back pain and shortness of breath. History of prior PE/DVT on Eliquis. EXAM: CT ANGIOGRAPHY CHEST WITH CONTRAST TECHNIQUE: Multidetector CT imaging of the chest was performed using the standard protocol during bolus administration of intravenous  contrast. Multiplanar CT image reconstructions and MIPs were obtained to evaluate the vascular anatomy. CONTRAST:  85 cc intravenous Isovue 370 COMPARISON:  10/26/2016 CT and prior studies FINDINGS: Cardiovascular: This is a technically satisfactory study. No pulmonary emboli are identified. The pulmonary emboli on prior CT have resolved. Upper limits normal heart size again noted. No thoracic aortic aneurysm or pericardial effusion. Mediastinum/Nodes: No enlarged mediastinal, hilar, or axillary lymph nodes. Thyroid gland, trachea, and esophagus demonstrate no significant findings. Lungs/Pleura: Lungs are clear. No pleural effusion or pneumothorax. Upper Abdomen: No acute abnormality. Musculoskeletal: No chest wall abnormality. No acute or significant osseous findings. Review of the MIP images confirms the above findings. IMPRESSION: No  acute abnormality. No evidence of pulmonary emboli or thoracic aortic aneurysm. Lungs are clear. Electronically Signed   By: Harmon Pier M.D.   On: 12/04/2016 18:05    Procedures Procedures (including critical care time)  Medications Ordered in ED Medications  iopamidol (ISOVUE-370) 76 % injection (not administered)  iopamidol (ISOVUE-370) 76 % injection 100 mL (85 mLs Intravenous Contrast Given 12/04/16 1734)     Initial Impression / Assessment and Plan / ED Course  I have reviewed the triage vital signs and the nursing notes.  Pertinent labs & imaging results that were available during my care of the patient were reviewed by me and considered in my medical decision making (see chart for details).     Afebrile, nontoxic patient with right upper leg pain that she was concerned because it was similar to prior DVT symptoms.  She has also been mildly SOB while walking this week but suspects it is due to walking in the extreme heat this week . She has had no CP.  She does have chronic back pain and was concerned because she had few symptoms prior to her PE.  She is on  Eliquis and is very consistent with her treatment.  Workup is reassuring.  EKG nonischemic.  Troponin x 1  8 hours after SOB is negative.  Labs unremarkable.  D/C home with close PCP follow up.  Discussed result, findings, treatment, and follow up  with patient.  Pt given return precautions.  Pt verbalizes understanding and agrees with plan.       Final Clinical Impressions(s) / ED Diagnoses   Final diagnoses:  Right leg pain  Hx of blood clots    New Prescriptions Discharge Medication List as of 12/04/2016  6:39 PM       Trixie Dredge, Cordelia Poche 12/04/16 1925    Rolland Porter, MD 12/13/16 1526

## 2016-12-04 NOTE — Discharge Instructions (Signed)
Read the information below.  You may return to the Emergency Department at any time for worsening condition or any new symptoms that concern you.   If you develop uncontrolled pain, weakness or numbness of the extremity, severe discoloration of the skin, or you are unable to walk or move your leg, return to the ER for a recheck.

## 2016-12-07 ENCOUNTER — Encounter: Payer: Self-pay | Admitting: Family Medicine

## 2016-12-07 ENCOUNTER — Ambulatory Visit (INDEPENDENT_AMBULATORY_CARE_PROVIDER_SITE_OTHER): Payer: PRIVATE HEALTH INSURANCE | Admitting: Family Medicine

## 2016-12-07 ENCOUNTER — Telehealth: Payer: Self-pay | Admitting: Family Medicine

## 2016-12-07 VITALS — BP 100/80 | HR 70 | Temp 98.4°F | Ht 64.75 in | Wt 127.1 lb

## 2016-12-07 DIAGNOSIS — F339 Major depressive disorder, recurrent, unspecified: Secondary | ICD-10-CM | POA: Diagnosis not present

## 2016-12-07 DIAGNOSIS — M79605 Pain in left leg: Secondary | ICD-10-CM | POA: Diagnosis not present

## 2016-12-07 DIAGNOSIS — F419 Anxiety disorder, unspecified: Secondary | ICD-10-CM | POA: Diagnosis not present

## 2016-12-07 DIAGNOSIS — M79601 Pain in right arm: Secondary | ICD-10-CM

## 2016-12-07 DIAGNOSIS — G8929 Other chronic pain: Secondary | ICD-10-CM | POA: Diagnosis not present

## 2016-12-07 DIAGNOSIS — M5440 Lumbago with sciatica, unspecified side: Principal | ICD-10-CM

## 2016-12-07 NOTE — Progress Notes (Signed)
HPI:  Acute visit for R posterior leg pain. Hx chronic pain, DDD, anxiety and depression, MTHFR mutation, thromboembolism. Sees a number of specialists. She called her hematologist for this last week as she was worried it was a blood clot and was directed to the ER. Eval in ER included exam, labs, negative CT angio of chest and neg LE duplex. She is having some pain in her R ext forearm muscles and her L medial upper post thigh. Sees [facility] for back issues, PT and reports will be seeing ortho. No SOB, swelling or erythema. She has been exercising a lot and is doing stretching and massaging for this as she also feels muscle tension and feels this has helped.  [facility]. This is really upsetting for her. It seems she feels the mutation may be linked to her chronic symptoms. She has ongoing depression and anxiety and has an appointment with her psychiatrist about this next week. She is upset that trintellix did not work out for her and feels she is running out of medication options for her mental health. She is considering alpha wave therapy.   ROS: See pertinent positives and negatives per HPI.  Past Medical History:  Diagnosis Date  . Allergy   . Depression   . DVT (deep venous thrombosis) (HCC)   . Fibromyalgia   . Genetic defect   . GERD (gastroesophageal reflux disease)   . Hypertension   . PE (pulmonary thromboembolism) (HCC)   . Positive TB test   . Urine incontinence     No past surgical history on file.  Family History  Problem Relation Age of Onset  . Arthritis Mother   . Hypertension Mother   . Mental illness Mother   . Arthritis Father   . Hypertension Father   . Mental illness Father   . Mental illness Brother   . Arthritis Daughter   . Cancer Maternal Aunt   . Hypertension Maternal Grandmother   . Arthritis Maternal Grandfather     Social History   Social History  . Marital status: Divorced   Spouse name: N/A  . Number of children: N/A  . Years of education: N/A   Social History Main Topics  . Smoking status: Never Smoker  . Smokeless tobacco: Never Used  . Alcohol use 0.0 oz/week     Comment: occasional   . Drug use: No  . Sexual activity: Not Asked   Other Topics Concern  . 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  acetaminophen (TYLENOL) 500 MG tablet, Take 1,000 mg by mouth 3 (three) times daily. , Disp: , Rfl:  .  ALPRAZolam (XANAX) 0.5 MG tablet, Take 0.25 mg by mouth at bedtime as needed for anxiety. , Disp: , Rfl:  .  apixaban (ELIQUIS) 5 MG TABS tablet, Take 2 tablets (10 mg total) by mouth 2 (two) times daily with a meal. (Patient taking differently: Take 5 mg by mouth 2 (two) times daily. ), Disp: 12 tablet, Rfl: 0 .  busPIRone (BUSPAR) 30 MG tablet, Take 30 mg by mouth 2 (two) times daily., Disp: ,  Rfl:  .  Cholecalciferol (VITAMIN D-3) 1000 units CAPS, Take 1,000 Units by mouth daily. , Disp: , Rfl:  .  Cyanocobalamin (B-12 PO), Take 1 tablet by mouth daily., Disp: , Rfl:  .  lisinopril (PRINIVIL,ZESTRIL) 10 MG tablet, Take 10 mg by mouth daily., Disp: , Rfl: 2 .  methocarbamol (ROBAXIN) 500 MG tablet, Take 500 mg by mouth 2 (two) times daily., Disp: , Rfl:  .  NON FORMULARY, Sam-E, Disp: , Rfl:  .  NON FORMULARY, Calm aid lavender, Disp: , Rfl:  .  Omega 3 1200 MG CAPS, Take 1,200 mg by mouth daily., Disp: , Rfl:  .  OVER THE COUNTER MEDICATION, Take 2-3 tablets by mouth at bedtime as needed (sleep). "Luna" herbal sleep aid supplement, Disp: , Rfl:  .  OVER THE COUNTER MEDICATION, Take 1 tablet by mouth 2 (two) times daily. "Mood systems balance", Disp: , Rfl:  .  predniSONE (DELTASONE) 5 MG  tablet, Take 5 mg by mouth 2 (two) times daily., Disp: , Rfl:  .  PRESCRIPTION MEDICATION, Deplin-Algal oil 15mg  capsule, Disp: , Rfl:  .  Probiotic Product (PROBIOTIC PO), Take 1 capsule by mouth daily. , Disp: , Rfl:  .  ranitidine (ZANTAC) 150 MG tablet, Take 150 mg by mouth daily., Disp: , Rfl: 5 .  traMADol (ULTRAM) 50 MG tablet, Take 50 mg by mouth 3 (three) times daily as needed for moderate pain. , Disp: , Rfl: 1 .  traZODone (DESYREL) 50 MG tablet, Take 50 mg by mouth at bedtime., Disp: , Rfl:  .  vitamin C (ASCORBIC ACID) 500 MG tablet, Take 1,000 mg by mouth daily. , Disp: , Rfl:   EXAM:  Vitals:   12/07/16 1421  BP: 100/80  Pulse: 70  Temp: 98.4 F (36.9 C)    Body mass index is 21.31 kg/m.  GENERAL: vitals reviewed and listed above, alert, oriented, appears well hydrated and in no acute distress  HEENT: atraumatic, conjunttiva clear, no obvious abnormalities on inspection of external nose and ears  LUNGS: clear to auscultation bilaterally, no wheezes, rales or rhonchi, good air movement  CV: HRRR, no peripheral edema  MS: moves all extremities without noticeable abnormality, mild TTP in the ext forearm tendons of the R arm, normal inspection/tone/function UE bilat otherwise; no appreciable TTP on inspection LEs,tone and function grossly intact in LE bilat, gait normal.  PSYCH: pleasant and cooperative, anxious, tearful at times  ASSESSMENT AND PLAN:  Discussed the following assessment and plan:  Pain of right upper extremity  Pain in lateral left lower extremity  Anxiety  Depression, recurrent (HCC)  Other chronic pain  -relatively new pt to me seeing a number of specialist for a long list of chronic symptoms -she seems most concerned about chronic pain and her psychiatric conditions today and is set up to see her specialists about these -She has had extensive labs and imaging and sees psychiatry and several specialist for her chronic pain -discussed  nutrition, exercise, counseling, alt treatments for depression that I advised she discuss with her psychiatrist and the possibility of seeing a pain specialist if her [facility] and orthopedic specialists are unable to help her -reiterated that I do not rx chronic pain medications -have offered genetics referral here, but I am not sure how much this will help her  -we reviewed her ER results and she does seem glad that the clot burden has completely resolved and that she had no new blood clots -advised follow up here as  needed and for her physical as scheduled  There are no Patient Instructions on file for this visit.  Kriste Basque R., DO

## 2016-12-07 NOTE — Telephone Encounter (Signed)
Dr Ethelene Halamos was under the impression pt was looking for a different opinion than what the neurosurgeon had given. Dr Ethelene Halamos agrees with the MRI results, so he doesn't need to see her for a 2nd opinion. So pt has no where else to go for pain management, pt would like to know if Dr Selena BattenKim could reach out to Dr Ethelene Halamos or should she go somewhere else?  Pt is fine with the exact treatment but prefers to see another neurosurgeon.  Had a bad experience with the first one. Pt states she has about 10 days left of pain medicine. Concerned she will be unable to work.

## 2016-12-07 NOTE — Telephone Encounter (Signed)
If Dr. Yetta BarreJones and Ethelene Halamos are in agreement on a plan for her back issues and Dr. Ethelene Halamos feels he can not offer anything further, then I am not sure I know of a referral that would be useful as they both are highly regarded. I do not have her neurosurgery notes so am not sure what they suggested. We will try to get these notes. Would suggest to perhaps follow up with her nsu to discuss options further? Ronnald Collum(Jo Anne, can you get notes from her neurosurgeon?)  In terms of pain medications, she could request a refill from her current neurosurgeon? Or if she prefers we could place a referral to a pain specialist?

## 2016-12-08 NOTE — Telephone Encounter (Signed)
I called the pt and informed her of the message below and she stated she was not looking for a different opinion as she did not feel comfortable with Dr Yetta BarreJones.  Stated she was told she could not see another doctor within his practice and does not want to go back as he told her at the first visit she was a "hypochondriac".  Patient states she made a complaint to Dr Barnett ApplebaumJones's office and cannot request a refill from their office and she does not need pain medication at this time. Message sent to Dr Selena BattenKim and the pt is aware this will addressed when she returns to the office.

## 2016-12-09 NOTE — Telephone Encounter (Signed)
I am not sure what she is requesting. Is she interested in nonsurgical or surgical options for her back issues? If nonsurgical can place referral to pain management for chronic back pain and DDD. If looking for surgical options can refer to another neurosurgeon or orthopedic office if she let us know where she wishes to go for a 2nd opinion.

## 2016-12-11 NOTE — Telephone Encounter (Signed)
Pt would like the referral done to a Dr  Wynn BankerKirsteins , with [facility]  Phone: [phone]  Pt sates this dr deals with back and neck pain, and all the issues she has. (fibromyalgia, and all her other things)

## 2016-12-12 ENCOUNTER — Encounter: Payer: Self-pay | Admitting: Hematology

## 2016-12-12 ENCOUNTER — Telehealth: Payer: Self-pay | Admitting: Hematology

## 2016-12-12 NOTE — Telephone Encounter (Signed)
Hem appt scheduled for the pt to see Dr. Candise CheKale on 8/1 at [facility]. Insurance and address verified. Letter mailed to the pt.

## 2016-12-12 NOTE — Telephone Encounter (Signed)
Order entered for a referral to see Dr Bertram GalaKiersteins and I called the pt and left a detailed message with this information at her cell number.

## 2016-12-28 ENCOUNTER — Encounter: Payer: Self-pay | Admitting: *Deleted

## 2017-01-03 ENCOUNTER — Ambulatory Visit (HOSPITAL_BASED_OUTPATIENT_CLINIC_OR_DEPARTMENT_OTHER): Payer: PRIVATE HEALTH INSURANCE | Admitting: Hematology

## 2017-01-03 VITALS — BP 116/80 | HR 74 | Temp 98.7°F | Resp 18 | Ht 64.75 in | Wt 128.7 lb

## 2017-01-03 DIAGNOSIS — I8290 Acute embolism and thrombosis of unspecified vein: Secondary | ICD-10-CM

## 2017-01-03 DIAGNOSIS — Z7901 Long term (current) use of anticoagulants: Secondary | ICD-10-CM | POA: Diagnosis not present

## 2017-01-03 DIAGNOSIS — D6859 Other primary thrombophilia: Secondary | ICD-10-CM

## 2017-01-03 DIAGNOSIS — I2609 Other pulmonary embolism with acute cor pulmonale: Secondary | ICD-10-CM

## 2017-01-03 DIAGNOSIS — Z86711 Personal history of pulmonary embolism: Secondary | ICD-10-CM | POA: Diagnosis not present

## 2017-01-03 NOTE — Progress Notes (Signed)
Marland Kitchen.    HEMATOLOGY/ONCOLOGY CONSULTATION NOTE  Date of Service: 01/03/2017  Patient Care Team: Terressa KoyanagiKim, Hannah R, DO as PCP - General (Family Medicine) Tereso NewcomerWeaver, Scott D (Chiropractic Medicine) Tia AlertJones, David S, MD as Consulting Physician (Neurosurgery) [facility], [facility], [facility]  CHIEF COMPLAINTS/PURPOSE OF CONSULTATION:  H/o Pulmonary embolism   HISTORY OF PRESENTING ILLNESS:   Faith Thomas is a wonderful 56 y.o. female who has been referred to us by Dr .Terressa KoyanagiKim, Hannah R, DO  for evaluation and management of venous thrombo-embolism.  Patient has a h/o HTN, fibromyalgia, GERD, LTBI , Raynaud's phenomenon presented to the hospital on 10/26/2016 with worsening exertional dyspnea for 3-4 days even with performing simple house chores. She also had some jaw pain. She came to the hospital for further evaluation and had a CT of the chest that showed submassive pulmonary embolism. Lower extremity venous ultrasound showed small segment acute DVT involving one of the right peroneal veins and one of the left posterior tibial veins.  She was not noted to have any overt triggers such as recent prolonged travel, surgery, hormonal medications, prolonged immobilization or other overt medical problems that could set was a trigger. No prior personal history of venous thromboembolism. Does have a strong family history of pulmonary embolism in her father, mother and brother.  She did have a hypercoagulable workup sent out from the hospital and was given hematology referral. Patient was treated with an discharged on Eliquis.  Hypercoagulable workup was unrevealing.  She had some outside records that showed possible MTHFR gene mutation. However her homocystine levels checked in May were within normal limits and we discussed this finding is of questionable consequence.  Patient notes that she does not have any chest pain and shortness of breath is significantly improved with  anticoagulation over the last about 2 half months. She did have a repeat CTA of the chest on 12/04/2016 due to some chest pain and back pain which showed no acute abnormality and no evidence of pulmonary emboli or thoracic aortic aneurysm.  She has not had any issues with bleeding or intolerance to her anticoagulation.  No focal symptoms suggestive of malignancy at this time.   MEDICAL HISTORY:  Past Medical History:  Diagnosis Date  . Allergy   . Depression   . DVT (deep venous thrombosis) (HCC)   . Fibromyalgia   . Genetic defect   . GERD (gastroesophageal reflux disease)   . Hypertension   . PE (pulmonary thromboembolism) (HCC)   . Positive TB test   . Urine incontinence     SURGICAL HISTORY: No past surgical history on file.  SOCIAL HISTORY: Social History   Social History  . Marital status: Divorced    Spouse name: N/A  . Number of children: N/A  . Years of education: N/A   Occupational History  . Not on file.   Social History Main Topics  . Smoking status: Never Smoker  . Smokeless tobacco: Never Used  . Alcohol use 0.0 oz/week     Comment: occasional   . Drug use: No  . Sexual activity: Not on file   Other Topics Concern  . 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 Arthritis Mother   . Hypertension Mother   . Mental illness Mother   . Arthritis Father   . Hypertension Father   . Mental illness Father   . Mental illness Brother   . Arthritis Daughter   . Cancer Maternal Aunt   . Hypertension Maternal Grandmother   . Arthritis Maternal Grandfather     ALLERGIES:  is allergic to bee venom; citalopram; cymbalta [duloxetine hcl]; duloxetine; escitalopram  oxalate; latex; other; progesterone; trintellix [vortioxetine]; and penicillins.  MEDICATIONS:  Current Outpatient Prescriptions  Medication Sig Dispense Refill  . acetaminophen (TYLENOL) 500 MG tablet Take 1,000 mg by mouth 3 (three) times daily.     Marland Kitchen. ALPRAZolam (XANAX) 0.5 MG tablet Take 0.5 mg by mouth at bedtime as needed for anxiety.     Marland Kitchen. apixaban (ELIQUIS) 5 MG TABS tablet Take 2 tablets (10 mg total) by mouth 2 (two) times daily with a meal. 360 tablet 0  . busPIRone (BUSPAR) 30 MG tablet Take 30 mg by mouth 2 (two) times daily.    . Cholecalciferol (VITAMIN D-3) 1000 units CAPS Take 10,000 Units by mouth daily.     . Cyanocobalamin (B-12 PO) Take 1 tablet by mouth daily.    . cyclobenzaprine (FLEXERIL) 5 MG tablet Take 1 tablet (5 mg total) by mouth 2 (two) times daily as needed for muscle spasms. 60 tablet 1  . lisinopril (PRINIVIL,ZESTRIL) 10 MG tablet Take 1 tablet (10 mg total) by mouth daily. 90 tablet 3  . mometasone (NASONEX) 50 MCG/ACT nasal spray Place 2 sprays into the nose as needed. 51 g 3  . NON FORMULARY Sam-E    . NON FORMULARY Calm aid lavender    . Omega 3 1200 MG CAPS Take 1,200 mg by mouth daily.    Marland Kitchen. OVER THE COUNTER MEDICATION Take 1 tablet by mouth 2 (two) times daily. "Mood systems balance"    . PRESCRIPTION MEDICATION Deplin-Algal oil 15mg  capsule    . Probiotic Product (PROBIOTIC PO) Take 1 capsule by mouth daily.     . ranitidine (ZANTAC) 150 MG tablet Take 1 tablet (150 mg total) by mouth daily. 90 tablet 3  . sertraline (ZOLOFT) 50 MG tablet Take 75 mg by mouth daily.  0  . SILENOR 6 MG TABS Take 1 tablet by mouth at bedtime.  4  . vitamin C (ASCORBIC ACID) 500 MG tablet Take 1,000 mg by mouth daily.      No current facility-administered medications for this visit.     REVIEW OF SYSTEMS:    10 Point review of Systems was done is negative except as noted above.  PHYSICAL EXAMINATION: ECOG PERFORMANCE STATUS: 1 - Symptomatic but completely  ambulatory  . Vitals:   01/03/17 1452  BP: 116/80  Pulse: 74  Resp: 18  Temp: 98.7 F (37.1 C)  SpO2: 100%   Filed Weights   01/03/17 1452  Weight: 128 lb 11.2 oz (58.4 kg)   .Body mass index is 21.58 kg/m.  GENERAL:alert, in no acute distress and comfortable SKIN: no acute rashes, no significant lesions EYES: conjunctiva are pink and non-injected, sclera anicteric OROPHARYNX: MMM, no exudates, no oropharyngeal erythema or ulceration NECK: supple, no JVD LYMPH:  no palpable lymphadenopathy in the cervical, axillary or inguinal regions LUNGS: clear to auscultation b/l with normal respiratory effort HEART: regular rate & rhythm ABDOMEN:  normoactive bowel sounds , non tender, not distended. No palpable hepato-splenomegaly. Extremity: no  pedal edema PSYCH: alert & oriented x 3 with fluent speech NEURO: no focal motor/sensory deficits  LABORATORY DATA:  I have reviewed the data as listed  . CBC Latest Ref Rng & Units 01/05/2017 12/04/2016 10/28/2016  WBC 3.9 - 10.3 10e3/uL 7.5 6.0 14.3(H)  Hemoglobin 11.6 - 15.9 g/dL 16.1 09.6 15.7(H)  Hematocrit 34.8 - 46.6 % 39.0 37.9 45.0  Platelets 145 - 400 10e3/uL 274 308 285    . CMP Latest Ref Rng & Units 01/05/2017 12/04/2016 10/27/2016  Glucose 70 - 140 mg/dl 045 90 409(W)  BUN 7.0 - 26.0 mg/dL 11.$[id number]$$[id number]$ Creatinine 0.6 - 1.1 mg/dL 0.8 2.70 6.23  Sodium 762 - 145 mEq/L 134(L) 139 137  Potassium 3.5 - 5.1 mEq/L 3.9 3.7 4.0  Chloride 101 - 111 mmol/L - 102 100(L)  CO2 22 - 29 mEq/L 25 29 28   Calcium 8.4 - 10.4 mg/dL 9.5 9.5 9.3  Total Protein 6.4 - 8.3 g/dL 6.4 - 6.4(L)  Total Bilirubin 0.20 - 1.20 mg/dL 8.31 - 0.6  Alkaline Phos 40 - 150 U/L 60 - 43  AST 5 - 34 U/L 18 - 28  ALT 0 - 55 U/L 15 - 30       Component     Latest Ref Rng & Units 10/26/2016 01/05/2017  PTT-LA     0.0 - 51.9 sec  31.4  DRVVT     0.0 - 47.0 sec  39.2  Lupus Anticoag Interp       Comment:  Anticardiolipin Ab,IgG,Qn     0 - 14 GPL U/mL  <9    Anticardiolipin Ab,IgM,Qn     0 - 12 MPL U/mL  <9  Anticardiolipin Ab,IgA,Qn     0 - 11 APL U/mL  <9  Beta-2 Glycoprotein I Ab, IgG     0 - 20 GPI IgG units  <9  Beta-2 Glyco 1 IgA     0 - 25 GPI IgA units  <9  Beta-2 Glyco 1 IgM     0 - 32 GPI IgM units  <9  Homocysteine     0.0 - 15.0 umol/L 8.0   Protein C-Functional     73 - 180 %  135  Protein S-Functional     63 - 140 %  86  Antithrombin Activity     75 - 135 %  120   RADIOGRAPHIC STUDIES: I have personally reviewed the radiological images as listed and agreed with the findings in the report. Ct Angio Chest Pe W/cm &/or Wo Cm  Result Date: 12/04/2016 CLINICAL DATA:  56 year old female with chest and back pain and shortness of breath. History of prior PE/DVT on Eliquis. EXAM: CT ANGIOGRAPHY CHEST WITH CONTRAST TECHNIQUE: Multidetector CT imaging of the chest was performed using the standard protocol during bolus administration of intravenous contrast. Multiplanar CT image reconstructions and MIPs were obtained to evaluate the vascular anatomy. CONTRAST:  85 cc intravenous Isovue 370 COMPARISON:  10/26/2016 CT and prior studies FINDINGS: Cardiovascular: This is a technically satisfactory study. No pulmonary emboli are identified. The pulmonary emboli on prior CT have resolved. Upper limits normal heart size again noted. No thoracic aortic aneurysm or pericardial effusion. Mediastinum/Nodes: No enlarged mediastinal, hilar, or axillary lymph nodes. Thyroid gland, trachea, and esophagus demonstrate no significant findings. Lungs/Pleura: Lungs are clear. No pleural effusion or pneumothorax. Upper Abdomen: No acute abnormality. Musculoskeletal: No chest wall abnormality. No acute or significant osseous findings. Review of the MIP images confirms the above findings. IMPRESSION:  No acute abnormality. No evidence of pulmonary emboli or thoracic aortic aneurysm. Lungs are clear. Electronically Signed   By: Harmon Pier M.D.   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 No clear trigger factors. Patient has a strong family history of venous thromboembolism in her brother mother and father. She had a hypercoagulable workup that was unrevealing. Outside records shown by [facility] suggest possible MTH FR gene mutation which we discussed would be of questionable clinical consequence especially since her homocystine levels were within normal limits.  Plan -We discussed that given her family history and clinically significant venous thromboembolism without any clear provoking factors we would recommend long-term anticoagulation. -She doesn't have any overtly modifiable risk factors. -We discussed that without anticoagulation the risk of recurrent venous thromboembolic episode would be 10-15% a year justifying the use of prophylactic anticoagulation after that initial 6 months of treatment. -A repeat CT of the chest showed that her PE was resolving with Apixaban -Reasonable to continue Apixaban. -Patient will follow-up with primary care physician for age-appropriate cancer screening.- -We discussed risk mitigation strategies to prevent recurrent venous thromboembolism. -Would need to follow-up with her primary care physician for continued refills on her anticoagulation and for lab monitoring with regards to renal and liver functions every 6 months. -If the patient's bleeding profile changes or if she at some point in the future decided she would not want to be on therapeutic anticoagulation after understanding the risks and benefits. She would need to be at least a baby aspirin or could consider prophylactic dose of NOAC's.  Continue follow-up with primary care physician Return to clinic on an as-needed basis with Dr. Candise Che if any new questions or concerns arise.   All of the patients questions were answered with apparent satisfaction.  The patient knows to call the clinic with any problems, questions or concerns.  I spent 40 minutes counseling the patient face to face. The total time spent in the appointment was 60 minutes and more than 50% was on counseling and direct patient cares.    Wyvonnia Lora MD MS AAHIVMS [facility] Hematology/Oncology Physician [facility]  (Office):       [phone] (Work cell):  [phone] (Fax):           [phone]  01/03/2017 2:45 PM

## 2017-01-04 ENCOUNTER — Telehealth: Payer: Self-pay | Admitting: Hematology

## 2017-01-04 NOTE — Telephone Encounter (Signed)
Spoke with patient regarding her upcoming lab appointment tomorrow.

## 2017-01-05 ENCOUNTER — Other Ambulatory Visit (HOSPITAL_BASED_OUTPATIENT_CLINIC_OR_DEPARTMENT_OTHER): Payer: PRIVATE HEALTH INSURANCE

## 2017-01-05 ENCOUNTER — Other Ambulatory Visit: Payer: PRIVATE HEALTH INSURANCE

## 2017-01-05 DIAGNOSIS — D6859 Other primary thrombophilia: Secondary | ICD-10-CM | POA: Diagnosis not present

## 2017-01-05 DIAGNOSIS — I8290 Acute embolism and thrombosis of unspecified vein: Secondary | ICD-10-CM

## 2017-01-05 DIAGNOSIS — I2609 Other pulmonary embolism with acute cor pulmonale: Secondary | ICD-10-CM

## 2017-01-05 LAB — CBC & DIFF AND RETIC
BASO%: 0.3 % (ref 0.0–2.0)
Basophils Absolute: 0 10*3/uL (ref 0.0–0.1)
EOS ABS: 0.1 10*3/uL (ref 0.0–0.5)
EOS%: 1.2 % (ref 0.0–7.0)
HCT: 39 % (ref 34.8–46.6)
HGB: 13.4 g/dL (ref 11.6–15.9)
IMMATURE RETIC FRACT: 3.2 % (ref 1.60–10.00)
LYMPH#: 3 10*3/uL (ref 0.9–3.3)
LYMPH%: 40.6 % (ref 14.0–49.7)
MCH: 33.5 pg (ref 25.1–34.0)
MCHC: 34.4 g/dL (ref 31.5–36.0)
MCV: 97.5 fL (ref 79.5–101.0)
MONO#: 0.6 10*3/uL (ref 0.1–0.9)
MONO%: 7.9 % (ref 0.0–14.0)
NEUT%: 50 % (ref 38.4–76.8)
NEUTROS ABS: 3.7 10*3/uL (ref 1.5–6.5)
NRBC: 0 % (ref 0–0)
Platelets: 274 10*3/uL (ref 145–400)
RBC: 4 10*6/uL (ref 3.70–5.45)
RDW: 12.4 % (ref 11.2–14.5)
Retic %: 1.4 % (ref 0.70–2.10)
Retic Ct Abs: 56 10*3/uL (ref 33.70–90.70)
WBC: 7.5 10*3/uL (ref 3.9–10.3)

## 2017-01-05 LAB — COMPREHENSIVE METABOLIC PANEL
ALT: 15 U/L (ref 0–55)
AST: 18 U/L (ref 5–34)
Albumin: 4.2 g/dL (ref 3.5–5.0)
Alkaline Phosphatase: 60 U/L (ref 40–150)
Anion Gap: 9 mEq/L (ref 3–11)
BUN: 10.2 mg/dL (ref 7.0–26.0)
CHLORIDE: 100 meq/L (ref 98–109)
CO2: 25 meq/L (ref 22–29)
Calcium: 9.5 mg/dL (ref 8.4–10.4)
Creatinine: 0.8 mg/dL (ref 0.6–1.1)
EGFR: 79 mL/min/{1.73_m2} — AB (ref >90)
GLUCOSE: 106 mg/dL (ref 70–140)
POTASSIUM: 3.9 meq/L (ref 3.5–5.1)
SODIUM: 134 meq/L — AB (ref 136–145)
TOTAL PROTEIN: 6.4 g/dL (ref 6.4–8.3)
Total Bilirubin: 0.22 mg/dL (ref 0.20–1.20)

## 2017-01-06 LAB — PROTEIN S ACTIVITY: Protein S-Functional: 86 % (ref 63–140)

## 2017-01-06 LAB — LUPUS ANTICOAGULANT PANEL
PTT-LA: 31.4 s (ref 0.0–51.9)
dRVVT: 39.2 s (ref 0.0–47.0)

## 2017-01-06 LAB — PROTEIN C ACTIVITY: Protein C-Functional: 135 % (ref 73–180)

## 2017-01-06 LAB — ANTITHROMBIN III: Antithrombin Activity: 120 % (ref 75–135)

## 2017-01-08 LAB — CARDIOLIPIN ANTIBODIES, IGG, IGM, IGA: Anticardiolipin Ab,IgM,Qn: <9 MPL U/mL (ref 0–12)

## 2017-01-08 LAB — BETA-2-GLYCOPROTEIN I ABS, IGG/M/A: Beta-2 Glyco 1 IgM: <9 GPI IgM units (ref 0–32)

## 2017-01-11 LAB — FACTOR 5 LEIDEN

## 2017-01-12 LAB — PROTHROMBIN GENE MUTATION

## 2017-01-15 ENCOUNTER — Encounter: Payer: Self-pay | Admitting: Physical Medicine & Rehabilitation

## 2017-01-18 ENCOUNTER — Other Ambulatory Visit: Payer: Self-pay | Admitting: *Deleted

## 2017-01-18 ENCOUNTER — Encounter: Payer: Self-pay | Admitting: Family Medicine

## 2017-01-18 ENCOUNTER — Ambulatory Visit (INDEPENDENT_AMBULATORY_CARE_PROVIDER_SITE_OTHER): Payer: PRIVATE HEALTH INSURANCE | Admitting: Family Medicine

## 2017-01-18 VITALS — BP 118/80 | HR 77 | Temp 98.5°F | Ht 64.75 in | Wt 128.7 lb

## 2017-01-18 DIAGNOSIS — K219 Gastro-esophageal reflux disease without esophagitis: Secondary | ICD-10-CM

## 2017-01-18 DIAGNOSIS — I10 Essential (primary) hypertension: Secondary | ICD-10-CM

## 2017-01-18 DIAGNOSIS — M797 Fibromyalgia: Secondary | ICD-10-CM | POA: Diagnosis not present

## 2017-01-18 DIAGNOSIS — F419 Anxiety disorder, unspecified: Secondary | ICD-10-CM | POA: Diagnosis not present

## 2017-01-18 DIAGNOSIS — E7212 Methylenetetrahydrofolate reductase deficiency: Secondary | ICD-10-CM | POA: Diagnosis not present

## 2017-01-18 DIAGNOSIS — F3341 Major depressive disorder, recurrent, in partial remission: Secondary | ICD-10-CM | POA: Diagnosis not present

## 2017-01-18 DIAGNOSIS — I2609 Other pulmonary embolism with acute cor pulmonale: Secondary | ICD-10-CM

## 2017-01-18 DIAGNOSIS — Z1589 Genetic susceptibility to other disease: Secondary | ICD-10-CM

## 2017-01-18 MED ORDER — MOMETASONE FUROATE 50 MCG/ACT NA SUSP: 2.0000 | NASAL | 3 refills | Status: DC | PRN

## 2017-01-18 MED ORDER — FLUCONAZOLE 150 MG PO TABS: 150.0000 mg | ORAL_TABLET | Freq: Once | ORAL | 0 refills | Status: AC

## 2017-01-18 MED ORDER — LISINOPRIL 10 MG PO TABS: 10.0000 mg | ORAL_TABLET | Freq: Every day | ORAL | 3 refills | Status: DC

## 2017-01-18 MED ORDER — APIXABAN 5 MG PO TABS: 10.0000 mg | ORAL_TABLET | Freq: Two times a day (BID) | ORAL | 0 refills | Status: DC

## 2017-01-18 MED ORDER — RANITIDINE HCL 150 MG PO TABS: 150.0000 mg | ORAL_TABLET | Freq: Every day | ORAL | 3 refills | Status: DC

## 2017-01-18 MED ORDER — CYCLOBENZAPRINE HCL 5 MG PO TABS: 5.0000 mg | ORAL_TABLET | Freq: Two times a day (BID) | ORAL | 1 refills | Status: DC | PRN

## 2017-01-18 NOTE — Telephone Encounter (Signed)
Rx done. 

## 2017-01-18 NOTE — Patient Instructions (Addendum)
BEFORE YOU LEAVE: -refill medications -abstract colonoscopy and tetanus from prior PCP -follow up: 3-4 months -CPE with pap in April 2019  I am so glad you are feeling better. Please let me know how the flexeril goes.  I sent the Diflucan to the pharmacy. I hope this helps.

## 2017-01-18 NOTE — Progress Notes (Signed)
HPI:  Faith Thomas is here for follow-up. She needs refills on several her medications including her lisinopril for her blood pressure, her eliquis for her history of pulmonary embolism, ranitidine for her acid reflux, and Nasonex for allergies. I am happy to hear, she is actually feeling better. She is on some new medications from her psychiatrist including Zoloft and doxepin. She reports these are going well. She is doing EMDR with integrative therapy. She feels like her mood has improved. She still has low energy at times. Has a history of muscle soreness and possible fibromyalgia. She is doing physical therapy and reports she loves her therapist. She tried a Flexeril from her friend and felt that this may be worked better than her Robaxin and wonders if she could try a prescription of this. She prefers to take the 10 mg dose. She plans to start some mindfulness meditation. She thinks she might have a yeast infection as she had some mild vulvovaginal irritation. She has been eating more sugar recently. She has a history of this and reports that she is allergic to topical yeast creams. Reports Diflucan has worked well in the past for her. She plans to do a physical in April, as she reports this is when she will be due for a Pap smear.   ROS: See pertinent positives and negatives per HPI.  Past Medical History:  Diagnosis Date  . Allergy   . Depression   . DVT (deep venous thrombosis) (HCC)   . Fibromyalgia   . Genetic defect   . GERD (gastroesophageal reflux disease)   . Hypertension   . PE (pulmonary thromboembolism) (HCC)   . Positive TB test   . Urine incontinence     No past surgical history on file.  Family History  Problem Relation Age of Onset  . Arthritis Mother   . Hypertension Mother   . Mental illness Mother   . Arthritis Father   . Hypertension Father   . Mental illness Father   . Mental illness Brother   . Arthritis Daughter   . Cancer Maternal Aunt   .  Hypertension Maternal Grandmother   . Arthritis Maternal Grandfather     Social History   Social History  . Marital status: Divorced    Spouse name: N/A  . Number of children: N/A  . Years of education: N/A   Social History Main Topics  . Smoking status: Never Smoker  . Smokeless tobacco: Never Used  . Alcohol use 0.0 oz/week     Comment: occasional   . Drug use: No  . Sexual activity: Not Asked   Other Topics Concern  . 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  acetaminophen (TYLENOL) 500 MG tablet, Take 1,000 mg by mouth 3 (three) times daily. , Disp: , Rfl:  .  ALPRAZolam (XANAX) 0.5 MG tablet, Take 0.5 mg by mouth at bedtime as needed for anxiety. , Disp: , Rfl:  .  apixaban (ELIQUIS) 5 MG TABS tablet, Take 2 tablets (10 mg total) by mouth 2 (two) times daily with a meal. (Patient taking differently: Take 5 mg by mouth 2 (two) times daily. ), Disp:  12 tablet, Rfl: 0 .  busPIRone (BUSPAR) 30 MG tablet, Take 30 mg by mouth 2 (two) times daily., Disp: , Rfl:  .  Cholecalciferol (VITAMIN D-3) 1000 units CAPS, Take 10,000 Units by mouth daily. , Disp: , Rfl:  .  Cyanocobalamin (B-12 PO), Take 1 tablet by mouth daily., Disp: , Rfl:  .  lisinopril (PRINIVIL,ZESTRIL) 10 MG tablet, Take 10 mg by mouth daily., Disp: , Rfl: 2 .  methocarbamol (ROBAXIN) 500 MG tablet, Take 500 mg by mouth 2 (two) times daily., Disp: , Rfl:  .  mometasone (NASONEX) 50 MCG/ACT nasal spray, Place 2 sprays into the nose as needed., Disp: , Rfl:  .  NON FORMULARY, Sam-E, Disp: , Rfl:  .  NON FORMULARY, Calm aid lavender, Disp: , Rfl:  .  Omega 3 1200 MG CAPS, Take 1,200 mg by mouth daily., Disp: , Rfl:  .  OVER THE COUNTER MEDICATION, Take 1  tablet by mouth 2 (two) times daily. "Mood systems balance", Disp: , Rfl:  .  PRESCRIPTION MEDICATION, Deplin-Algal oil 15mg  capsule, Disp: , Rfl:  .  Probiotic Product (PROBIOTIC PO), Take 1 capsule by mouth daily. , Disp: , Rfl:  .  ranitidine (ZANTAC) 150 MG tablet, Take 150 mg by mouth daily., Disp: , Rfl: 5 .  sertraline (ZOLOFT) 50 MG tablet, Take 75 mg by mouth daily., Disp: , Rfl: 0 .  SILENOR 6 MG TABS, Take 1 tablet by mouth at bedtime., Disp: , Rfl: 4 .  vitamin C (ASCORBIC ACID) 500 MG tablet, Take 1,000 mg by mouth daily. , Disp: , Rfl:   EXAM:  Vitals:   01/18/17 1614  BP: 118/80  Pulse: 77  Temp: 98.5 F (36.9 C)    Body mass index is 21.58 kg/m.  GENERAL: vitals reviewed and listed above, alert, oriented, appears well hydrated and in no acute distress  HEENT: atraumatic, conjunttiva clear, no obvious abnormalities on inspection of external nose and ears  NECK: no obvious masses on inspection  LUNGS: clear to auscultation bilaterally, no wheezes, rales or rhonchi, good air movement  CV: HRRR, no peripheral edema  MS: moves all extremities without noticeable abnormality  PSYCH: pleasant and cooperative, no obvious depression or anxiety  ASSESSMENT AND PLAN:  Discussed the following assessment and plan:  Fibromyalgia  Essential hypertension  Anxiety  Recurrent major depressive disorder, in partial remission (HCC)  Gastroesophageal reflux disease, esophagitis presence not specified  -Am so glad she is feeling better -Refills provided -Congratulated on the therapies and boundaries she is setting -Prefer that she try a lower dose of Flexeril first and sent in 5 mg dose to try, advised she call us to let us know how this goes -We'll plan a follow-up in about 3-4 months and a physical in April -Patient advised to return or notify a doctor immediately if symptoms worsen or persist or new concerns arise.  There are no Patient Instructions on file for this  visit.  Kriste BasqueKIM, Lilibeth Opie R., DO

## 2017-01-23 ENCOUNTER — Telehealth: Payer: Self-pay | Admitting: *Deleted

## 2017-01-23 NOTE — Telephone Encounter (Signed)
-----   Message from Faith Maine, MD sent at 01/23/2017  2:43 AM EDT ----- Plz let patient know he lab testing did not show an overt detected thrombophilia. Based on extent of DVT/PE and her strong Fhx - we will stick with our discussed recommendation to pursue long term anticoagulation and monitor with her PCP.  thx GK

## 2017-01-23 NOTE — Telephone Encounter (Signed)
Per staff message, sw pt regarding lab results.  Informed pt no evidence of thrombophilia, pt to continue current  anticoagulation and follow up with PCP.

## 2017-01-26 ENCOUNTER — Other Ambulatory Visit: Payer: Self-pay | Admitting: Family Medicine

## 2017-01-26 DIAGNOSIS — Z1231 Encounter for screening mammogram for malignant neoplasm of breast: Secondary | ICD-10-CM

## 2017-01-29 ENCOUNTER — Encounter: Payer: Self-pay | Admitting: Physical Medicine & Rehabilitation

## 2017-01-29 ENCOUNTER — Ambulatory Visit (HOSPITAL_BASED_OUTPATIENT_CLINIC_OR_DEPARTMENT_OTHER): Payer: PRIVATE HEALTH INSURANCE | Admitting: Physical Medicine & Rehabilitation

## 2017-01-29 ENCOUNTER — Ambulatory Visit: Payer: PRIVATE HEALTH INSURANCE | Admitting: Physical Medicine & Rehabilitation

## 2017-01-29 ENCOUNTER — Encounter: Payer: PRIVATE HEALTH INSURANCE | Attending: Physical Medicine & Rehabilitation

## 2017-01-29 VITALS — BP 117/86 | HR 94

## 2017-01-29 DIAGNOSIS — M542 Cervicalgia: Secondary | ICD-10-CM | POA: Insufficient documentation

## 2017-01-29 DIAGNOSIS — Z7901 Long term (current) use of anticoagulants: Secondary | ICD-10-CM | POA: Insufficient documentation

## 2017-01-29 DIAGNOSIS — M545 Low back pain: Secondary | ICD-10-CM | POA: Insufficient documentation

## 2017-01-29 DIAGNOSIS — Z86711 Personal history of pulmonary embolism: Secondary | ICD-10-CM | POA: Insufficient documentation

## 2017-01-29 DIAGNOSIS — F329 Major depressive disorder, single episode, unspecified: Secondary | ICD-10-CM | POA: Insufficient documentation

## 2017-01-29 DIAGNOSIS — G8929 Other chronic pain: Secondary | ICD-10-CM | POA: Insufficient documentation

## 2017-01-29 DIAGNOSIS — M47896 Other spondylosis, lumbar region: Secondary | ICD-10-CM | POA: Insufficient documentation

## 2017-01-29 DIAGNOSIS — I10 Essential (primary) hypertension: Secondary | ICD-10-CM | POA: Insufficient documentation

## 2017-01-29 DIAGNOSIS — M1288 Other specific arthropathies, not elsewhere classified, other specified site: Secondary | ICD-10-CM | POA: Insufficient documentation

## 2017-01-29 DIAGNOSIS — M4802 Spinal stenosis, cervical region: Secondary | ICD-10-CM | POA: Insufficient documentation

## 2017-01-29 DIAGNOSIS — K219 Gastro-esophageal reflux disease without esophagitis: Secondary | ICD-10-CM | POA: Insufficient documentation

## 2017-01-29 DIAGNOSIS — M797 Fibromyalgia: Secondary | ICD-10-CM

## 2017-01-29 MED ORDER — PREGABALIN 25 MG PO CAPS: 25.0000 mg | ORAL_CAPSULE | Freq: Two times a day (BID) | ORAL | 0 refills | Status: DC

## 2017-01-29 NOTE — Patient Instructions (Signed)
We try ambulating 15 minutes 3 times a day, would recommend getting heart rate above 100 beats per minute

## 2017-01-29 NOTE — Progress Notes (Signed)
Subjective:    Patient ID: Faith Thomas, female    DOB: [birth year], 56 y.o.   MRN: [id number]  HPI Chief complaint is widespread body pain  56 year old female with prior medical history significant for depression, possible fibromyalgia, chronic low back and neck pain who is referred by her chiropractor for the above symptoms. She has been getting spinal manipulations, but has not shown any progressive improvement. She does have exacerbations and remissions of her symptoms.  She has no shooting pain down into the arms or legs. She does have some pain in the right buttock. Which goes into her posterior thigh area. She denies any history of injury or trauma to that area.  She has recently [facility]. She finds this quite helpful, working on right lower extremity, soft tissue mobilization.  Patient had been on Cymbalta in the past for depression as well as her pain symptoms and found this to be helpful for the pain, however, over time she developed adverse cognitive side effects. The patient states that she does sometimes over time developed side effects to medications, rather than immediately after starting them.  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 Candise Che, no obvious cause for history of pulmonary embolism, given that homocysteine level was normal in the presence of MTHFR gene mutations Plans are for long-term anticoagulation, currently on apixiban  TSH is normal   PMH  GERD, IBS Pain Inventory Average Pain 7 Pain Right Now 5 My pain is constant, sharp, dull, stabbing, tingling and aching  In the last 24 hours, has pain interfered with the following? General activity 8 Relation with others 8 Enjoyment of life 6 What TIME of day  is your pain at its worst? all Sleep (in general) Fair  Pain is worse with: bending, inactivity and some activites Pain improves with: rest, heat/ice, therapy/exercise, medication and TENS Relief from Meds: 5  Mobility walk without assistance ability to climb steps?  yes do you drive?  yes  Function employed # of hrs/week 40  Neuro/Psych bladder control problems weakness numbness tremor tingling spasms dizziness confusion depression anxiety  Prior Studies Any changes since last visit?  no  Physicians involved in your care Any changes since last visit?  no   Family History  Problem Relation Age of Onset  . Arthritis Mother   . Hypertension Mother   . Mental illness Mother   . Arthritis Father   . Hypertension Father   . Mental illness Father   . Mental illness Brother   . Arthritis Daughter   . Cancer Maternal Aunt   . Hypertension Maternal Grandmother   . Arthritis Maternal Grandfather    Social History   Social History  . Marital status: Divorced    Spouse name: N/A  . Number of children: N/A  . Years of education: N/A   Social History Main Topics  . Smoking status: Never Smoker  . Smokeless tobacco: Never Used  . Alcohol use 0.0 oz/week     Comment: occasional   . Drug use: No  . Sexual activity: Not on file   Other Topics Concern  . 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 Past Medical History:  Diagnosis Date  . Allergy   . Depression   . DVT (deep venous thrombosis) (HCC)   . Fibromyalgia   . Genetic defect   . GERD (gastroesophageal reflux disease)   . Hypertension   . PE (pulmonary thromboembolism) (HCC)   . Positive TB test   . Urine incontinence    There were no  vitals taken for this visit.  Opioid Risk Score:   Fall Risk Score:  `1  Depression screen PHQ 2/9  No flowsheet data found.   Review of Systems  Constitutional: Positive for unexpected weight change.  HENT: Negative.   Eyes: Negative.   Respiratory: Negative.   Cardiovascular: Negative.   Gastrointestinal: Negative.   Endocrine: Negative.   Genitourinary: Negative.   Musculoskeletal: Negative.   Allergic/Immunologic: Negative.   Neurological: Negative.   Hematological: Negative.   Psychiatric/Behavioral: Negative.   All other systems reviewed and are negative.      Objective:   Physical Exam  Constitutional: She is oriented to person, place, and time. She appears well-developed and well-nourished. No distress.  HENT:  Head: Atraumatic.  Eyes: Pupils are equal, round, and reactive to light. Conjunctivae and EOM are normal.  Neck: Normal range of motion. Neck supple. No JVD present.  Cardiovascular: Normal rate, regular rhythm and normal heart sounds.  Exam reveals no friction rub.   No murmur heard. Pulmonary/Chest: Effort normal and breath sounds normal. No stridor. No respiratory distress. She has no wheezes.  Abdominal: Soft. Bowel sounds are normal. She exhibits no distension. There is no tenderness.  Musculoskeletal:       Cervical back: She exhibits tenderness. She exhibits normal range of motion, no swelling and no deformity.       Thoracic back: She exhibits deformity. She exhibits normal range of motion and no tenderness.       Lumbar back: She exhibits normal range of motion, no tenderness, no edema and no deformity.       Back:  Mild levoconvex thoracic scoliosis. No compensatory lumbar curve  There is mild pain with lumbar extension but no limitation with range of motion.  Tenderness over right ischial tuberosity and along the hamstrings  Neurological: She is alert and oriented to person, place, and time. She displays no atrophy. No sensory deficit. She  exhibits normal muscle tone. Coordination and gait normal.  Reflex Scores:      Tricep reflexes are 2+ on the right side and 2+ on the left side.      Bicep reflexes are 2+ on the right side and 2+ on the left side.      Brachioradialis reflexes are 2+ on the right side and 2+ on the left side.      Patellar reflexes are 2+ on the right side and 2+ on the left side.      Achilles reflexes are 2+ on the right side and 2+ on the left side. Pinprick sensation, normal bilateral C5, C6-C7, C8, L2, L3, L4, L5-S1 dermatomal distribution    Skin: She is not diaphoretic.  Psychiatric: She has a normal mood and affect. Her speech is normal and behavior is normal. Judgment and thought content normal. Cognition and memory are normal.  Nursing note and vitals reviewed.  2 out of 18  fibromyalgia tender points. Positive        Assessment & Plan:  1. Chronic widespread body pain,  history of irritable bowel, chronic fatigue and depression, probable fibromyalgia syndrome, which has been partially treated She is getting some symptomatic improvement in her overall pain with [facility].  In addition, her sleep has improved with changes in her achy present medications.  We'll try Lyrica 25 mg twice a day return in 4 weeks. May need to titrate upward  We discussed at length that fibromyalgia is a pain processing problem and not a primary muscle disease. We discussed how fibromyalgia may amplify certain pains to make them seem more severe.  2. Cervical spinal stenosis without radiculopathy, she does have facet arthropathy with some chronic right-sided neck pain. She has severe cervical stenosis potentially affecting C6 nerve roots. However, no symptoms. At this point would not pursue cervical medial branch blocks. Given the fact she is being treated for pulmonary embolism  3. Lumbar spondylosis without myelopathy, she has  L4-5 and L5-S1 facet arthropathy  We looked over spine  model explaining MRI findings. Half of the 60 minutes was spent counseling and coordinating care

## 2017-02-01 ENCOUNTER — Telehealth: Payer: Self-pay | Admitting: *Deleted

## 2017-02-01 NOTE — Telephone Encounter (Signed)
[facility] called about her lyrica not being sent to pharmacy.  I called her back to see if she got the rx that was printed.  She did not realize it was with her papers and she does have the Rx. No further action required.

## 2017-02-12 ENCOUNTER — Encounter: Payer: Self-pay | Admitting: Family Medicine

## 2017-02-12 MED ORDER — APIXABAN 5 MG PO TABS: 5.0000 mg | ORAL_TABLET | Freq: Two times a day (BID) | ORAL | 1 refills | Status: DC

## 2017-02-12 NOTE — Telephone Encounter (Signed)
Left message for pt on phone. Sent correct dosing instructions.

## 2017-02-16 ENCOUNTER — Ambulatory Visit: Payer: PRIVATE HEALTH INSURANCE

## 2017-02-22 ENCOUNTER — Encounter: Payer: Self-pay | Admitting: Family Medicine

## 2017-02-26 ENCOUNTER — Ambulatory Visit (HOSPITAL_BASED_OUTPATIENT_CLINIC_OR_DEPARTMENT_OTHER): Payer: PRIVATE HEALTH INSURANCE | Admitting: Physical Medicine & Rehabilitation

## 2017-02-26 ENCOUNTER — Encounter: Payer: Self-pay | Admitting: Physical Medicine & Rehabilitation

## 2017-02-26 ENCOUNTER — Encounter: Payer: PRIVATE HEALTH INSURANCE | Attending: Physical Medicine & Rehabilitation

## 2017-02-26 VITALS — BP 122/82 | HR 82 | Resp 14

## 2017-02-26 DIAGNOSIS — F329 Major depressive disorder, single episode, unspecified: Secondary | ICD-10-CM | POA: Insufficient documentation

## 2017-02-26 DIAGNOSIS — Z7901 Long term (current) use of anticoagulants: Secondary | ICD-10-CM | POA: Diagnosis not present

## 2017-02-26 DIAGNOSIS — Z86711 Personal history of pulmonary embolism: Secondary | ICD-10-CM | POA: Diagnosis not present

## 2017-02-26 DIAGNOSIS — M1288 Other specific arthropathies, not elsewhere classified, other specified site: Secondary | ICD-10-CM | POA: Diagnosis not present

## 2017-02-26 DIAGNOSIS — M797 Fibromyalgia: Secondary | ICD-10-CM | POA: Insufficient documentation

## 2017-02-26 DIAGNOSIS — M542 Cervicalgia: Secondary | ICD-10-CM | POA: Insufficient documentation

## 2017-02-26 DIAGNOSIS — M4802 Spinal stenosis, cervical region: Secondary | ICD-10-CM | POA: Insufficient documentation

## 2017-02-26 DIAGNOSIS — M47896 Other spondylosis, lumbar region: Secondary | ICD-10-CM | POA: Insufficient documentation

## 2017-02-26 DIAGNOSIS — M545 Low back pain: Secondary | ICD-10-CM | POA: Diagnosis not present

## 2017-02-26 DIAGNOSIS — I10 Essential (primary) hypertension: Secondary | ICD-10-CM | POA: Diagnosis not present

## 2017-02-26 DIAGNOSIS — K219 Gastro-esophageal reflux disease without esophagitis: Secondary | ICD-10-CM | POA: Diagnosis not present

## 2017-02-26 DIAGNOSIS — G8929 Other chronic pain: Secondary | ICD-10-CM | POA: Insufficient documentation

## 2017-02-26 MED ORDER — TRAMADOL HCL 50 MG PO TABS: 50.0000 mg | ORAL_TABLET | Freq: Two times a day (BID) | ORAL | 0 refills | Status: DC | PRN

## 2017-02-26 MED ORDER — PREGABALIN 25 MG PO CAPS: 25.0000 mg | ORAL_CAPSULE | Freq: Three times a day (TID) | ORAL | 0 refills | Status: DC

## 2017-02-26 NOTE — Patient Instructions (Signed)
Wait until weekend to reduce your Tylenol dosing

## 2017-02-26 NOTE — Progress Notes (Signed)
Subjective:    Patient ID: Faith Thomas, female    DOB: 1961-01-17, 56 y.o.   MRN: [id number]  HPI Left low back and Right buttocks pain overall improved Pt was gardening and cooking a lot more before her back and hip was bothering her Driving tolerance ~5HQ. Doing ok with work activity uses hi/Low Desk  Walking time ~40min /day up from 20-30 min  No side effects from Lyrica. Goal of Treatment is to come off of some of her medications, specifically Tylenol and Flexeril Pain Inventory Average Pain 4 Pain Right Now 3 My pain is dull, stabbing and aching  In the last 24 hours, has pain interfered with the following? General activity 4 Relation with others 4 Enjoyment of life 4 What TIME of day is your pain at its worst? varies Sleep (in general) Fair  Pain is worse with: bending, inactivity, unsure and some activites Pain improves with: heat/ice, therapy/exercise and medication Relief from Meds: 5  Mobility walk without assistance how many minutes can you walk? 40  ability to climb steps?  yes do you drive?  yes Do you have any goals in this area?  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  yes  Neuro/Psych bladder control problems weakness tremor spasms dizziness confusion depression anxiety  Prior Studies Any changes since last visit?  no  Physicians involved in your care Any changes since last visit?  no   Family History  Problem Relation Age of Onset  . Arthritis Mother   . Hypertension Mother   . Mental illness Mother   . Arthritis Father   . Hypertension Father   . Mental illness Father   . Mental illness Brother   . Arthritis Daughter   . Cancer Maternal Aunt   . Hypertension Maternal Grandmother   . Arthritis Maternal Grandfather    Social History   Social History  . Marital status: Divorced    Spouse name: N/A  . Number of children: N/A  . Years of  education: N/A   Social History Main Topics  . Smoking status: Never Smoker  . Smokeless tobacco: Never Used  . Alcohol use 0.0 oz/week     Comment: occasional   . Drug use: No  . Sexual activity: Not Asked   Other Topics Concern  . 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 No pertinent surgical history. Past Medical History:  Diagnosis Date  . Allergy   . Depression   . DVT (deep venous thrombosis) (HCC)   . Fibromyalgia   . Genetic defect   . GERD (gastroesophageal reflux disease)   . Hypertension   . PE (pulmonary thromboembolism) (HCC)   . Positive TB test   . Urine incontinence    BP 122/82 (BP Location: Left Arm, Patient Position: Sitting, Cuff Size: Normal)   Pulse 82   Resp 14   SpO2 98%   Opioid Risk Score:   Fall Risk Score:  `1  Depression screen PHQ 2/9  No flowsheet data found.  Review of Systems  Constitutional: Negative.   HENT: Negative.   Eyes: Negative.  Respiratory: Negative.   Cardiovascular: Negative.   Gastrointestinal: Negative.   Endocrine: Negative.   Genitourinary: Negative.   Musculoskeletal: Positive for arthralgias, back pain, myalgias and neck pain.       Spasms   Skin: Negative.   Allergic/Immunologic: Negative.   Neurological: Positive for dizziness.  Psychiatric/Behavioral: Positive for confusion and dysphoric mood. The patient is nervous/anxious.   All other systems reviewed and are negative.      Objective:   Physical Exam  Constitutional: She is oriented to person, place, and time. She appears well-developed and well-nourished.  HENT:  Head: Normocephalic and atraumatic.  Eyes: Pupils are equal, round, and reactive to light. Conjunctivae and EOM are normal.  Neurological: She  is alert and oriented to person, place, and time. No sensory deficit. She exhibits normal muscle tone. Coordination and gait normal.  Reflex Scores:      Tricep reflexes are 2+ on the right side and 2+ on the left side.      Bicep reflexes are 2+ on the right side and 2+ on the left side.      Brachioradialis reflexes are 2+ on the right side and 2+ on the left side.      Patellar reflexes are 2+ on the right side and 2+ on the left side.      Achilles reflexes are 2+ on the right side and 2+ on the left side. Psychiatric: She has a normal mood and affect.  Nursing note and vitals reviewed.  Motor strength 5/5 bilateral deltoids, biceps, triceps, grip, hip flexor, knee extensor, ankle dorsal flexor. Sensation intact to light touch and pinprick bilateral upper and lower limbs. She has tenderness palpation bilateral upper trapezius, bilateral upper scapular, bilateral greater trochanter, bilateral PSIS  Lumbar spine range of motion 100 percent flexion, extension     Assessment & Plan:  1. Fibromyalgia syndrome. Will increase Lyrica 25 3 times a day, long discussion in terms of mechanism of action and side effects  [facility]  2. Lumbar spondylosis without myelopathy, may be a good candidate for lumbar medial branch blocks but needs to be on anticoagulation. A solid 6 months for her pulmonary embolism. That would be sometime in November.  Over half of the 25 min visit was spent counseling and coordinating care.

## 2017-03-05 ENCOUNTER — Encounter: Payer: Self-pay | Admitting: Family Medicine

## 2017-03-06 ENCOUNTER — Other Ambulatory Visit: Payer: Self-pay | Admitting: *Deleted

## 2017-03-06 DIAGNOSIS — G4730 Sleep apnea, unspecified: Secondary | ICD-10-CM

## 2017-03-12 ENCOUNTER — Other Ambulatory Visit: Payer: Self-pay | Admitting: Family Medicine

## 2017-03-12 ENCOUNTER — Encounter: Payer: Self-pay | Admitting: Family Medicine

## 2017-03-13 ENCOUNTER — Encounter: Payer: Self-pay | Admitting: Pulmonary Disease

## 2017-03-13 ENCOUNTER — Ambulatory Visit (INDEPENDENT_AMBULATORY_CARE_PROVIDER_SITE_OTHER): Payer: PRIVATE HEALTH INSURANCE | Admitting: Pulmonary Disease

## 2017-03-13 VITALS — BP 112/68 | HR 83 | Ht 65.0 in | Wt 129.0 lb

## 2017-03-13 DIAGNOSIS — R29818 Other symptoms and signs involving the nervous system: Secondary | ICD-10-CM | POA: Diagnosis not present

## 2017-03-13 DIAGNOSIS — G4700 Insomnia, unspecified: Secondary | ICD-10-CM | POA: Diagnosis not present

## 2017-03-13 NOTE — Progress Notes (Signed)
   Subjective:    Patient ID: Faith Thomas, female    DOB: 01/15/1961, 56 y.o.   MRN: [id number]  HPI    Review of Systems  Constitutional: Negative for fever and unexpected weight change.  HENT: Negative for congestion, dental problem, ear pain, nosebleeds, postnasal drip, rhinorrhea, sinus pressure, sneezing, sore throat and trouble swallowing.   Eyes: Positive for discharge. Negative for redness and itching.  Respiratory: Negative for cough, chest tightness, shortness of breath and wheezing.   Cardiovascular: Negative for palpitations and leg swelling.  Gastrointestinal: Negative for nausea and vomiting.  Genitourinary: Negative for dysuria.  Musculoskeletal: Positive for joint swelling.  Skin: Negative for rash.  Allergic/Immunologic: Positive for environmental allergies. Negative for food allergies.  Hematological: Bruises/bleeds easily.  Psychiatric/Behavioral: Negative for dysphoric mood. The patient is nervous/anxious.        Objective:   Physical Exam        Assessment & Plan:

## 2017-03-13 NOTE — Patient Instructions (Signed)
Will arrange for in lab sleep study Will call to arrange for follow up after sleep study reviewed  

## 2017-03-13 NOTE — Progress Notes (Signed)
Past Surgical History She  has no past surgical history on file.  Allergies  Allergen Reactions  . Bee Venom   . Citalopram Tinitus  . Cymbalta [Duloxetine Hcl]     -memory loss  . Duloxetine Other (See Comments)  . Escitalopram Oxalate Tinitus  . Latex   . Other     Yeast creams- causes secondary infections, itching, burning, redness   . Progesterone     Pt feels caused weakness, fatigue, cognitive deficits  . Trintellix [Vortioxetine]     Memory problems  . Penicillins Swelling and Rash    Has patient had a PCN reaction causing immediate rash, facial/tongue/throat swelling, SOB or lightheadedness with hypotension: Yes Has patient had a PCN reaction causing severe rash involving mucus membranes or skin necrosis: No Has patient had a PCN reaction that required hospitalization: No Has patient had a PCN reaction occurring within the last 10 years: No If all of the above answers are "NO", then may proceed with Cephalosporin use.    Family History Her family history includes Arthritis in her daughter, father, maternal grandfather, and mother; Cancer in her maternal aunt; Hypertension in her father, maternal grandmother, and mother; Mental illness in her brother, father, and mother.  Social History She  reports that she has never smoked. She has never used smokeless tobacco. She reports that she drinks alcohol. She reports that she does not use drugs.  Review of systems Constitutional: Negative for fever and unexpected weight change.  HENT: Negative for congestion, dental problem, ear pain, nosebleeds, postnasal drip, rhinorrhea, sinus pressure, sneezing, sore throat and trouble swallowing.   Eyes: Positive for discharge. Negative for redness and itching.  Respiratory: Negative for cough, chest tightness, shortness of breath and wheezing.   Cardiovascular: Negative for palpitations and leg swelling.  Gastrointestinal: Negative for nausea and vomiting.  Genitourinary: Negative for  dysuria.  Musculoskeletal: Positive for joint swelling.  Skin: Negative for rash.  Allergic/Immunologic: Positive for environmental allergies. Negative for food allergies.  Hematological: Bruises/bleeds easily.  Psychiatric/Behavioral: Negative for dysphoric mood. The patient is nervous/anxious.     Current Outpatient Prescriptions on File Prior to Visit  Medication Sig  . acetaminophen (TYLENOL) 500 MG tablet Take 1,000 mg by mouth 3 (three) times daily.   Marland Kitchen ALPRAZolam (XANAX) 0.5 MG tablet Take 0.5 mg by mouth at bedtime as needed for anxiety.   Marland Kitchen apixaban (ELIQUIS) 5 MG TABS tablet Take 1 tablet (5 mg total) by mouth 2 (two) times daily with a meal.  . b complex vitamins capsule Take 1 capsule by mouth daily.  . busPIRone (BUSPAR) 30 MG tablet Take 30 mg by mouth 2 (two) times daily.  . Cholecalciferol (VITAMIN D-3) 1000 units CAPS Take 10,000 Units by mouth daily.   . cyclobenzaprine (FLEXERIL) 5 MG tablet TAKE 1 TABLET BY MOUTH TWICE A DAY AS NEEDED FOR MUSCLE SPASMS  . L-Methylfolate-Algae (DEPLIN 15 PO) Take by mouth.  Marland Kitchen lisinopril (PRINIVIL,ZESTRIL) 10 MG tablet Take 1 tablet (10 mg total) by mouth daily.  . mometasone (NASONEX) 50 MCG/ACT nasal spray Place 2 sprays into the nose as needed.  . Omega 3 1200 MG CAPS Take 1,200 mg by mouth daily.  Marland Kitchen OVER THE COUNTER MEDICATION Take 1 tablet by mouth 2 (two) times daily. "Mood systems balance"  . pregabalin (LYRICA) 25 MG capsule Take 1 capsule (25 mg total) by mouth 3 (three) times daily.  . Probiotic Product (PROBIOTIC PO) Take 1 capsule by mouth daily.   . S-Adenosylmethionine (SAM-E) 400  MG TABS Take by mouth.  . sertraline (ZOLOFT) 100 MG tablet Take 150 mg by mouth daily.   . sertraline (ZOLOFT) 50 MG tablet Take 75 mg by mouth daily.  Marland Kitchen SILENOR 6 MG TABS Take 1 tablet by mouth at bedtime.  . traMADol (ULTRAM) 50 MG tablet Take 1 tablet (50 mg total) by mouth every 12 (twelve) hours as needed.  . vitamin C (ASCORBIC ACID) 500  MG tablet Take 1,000 mg by mouth daily.   . ranitidine (ZANTAC) 150 MG tablet Take 1 tablet (150 mg total) by mouth daily. (Patient not taking: Reported on 03/13/2017)   No current facility-administered medications on file prior to visit.     Chief Complaint  Patient presents with  . Sleep Consult    Pt referred by Dr. Kriste Basque MD- PCP. Pt had blood clots in both lungs this past May, having troubles sleeping. Pt is sleepy during the day at times.      Pulmonary tests CT angio chest 10/26/16 >> large PE  Cardiac tests Echo 10/26/16 >> EF 65 to 70%, grade 1 DD, PAS 42 mmHg, severe RV systolic dysfx  Past medical history She  has a past medical history of Allergy; Depression; DVT (deep venous thrombosis) (HCC); Fibromyalgia; Genetic defect; GERD (gastroesophageal reflux disease); Hypertension; PE (pulmonary thromboembolism) (HCC); Positive TB test; and Urine incontinence.  Vital signs BP 112/68 (BP Location: Left Arm, Cuff Size: Normal)   Pulse 83   Ht  (1.651 m)   Wt 129 lb (58.5 kg)   SpO2 98%   BMI 21.47 kg/m   History of Present Illness Aarna Mihalko is a 56 y.o. female for evaluation of sleep problems.  She has trouble falling asleep and staying asleep.  This ha been present for some time.  She snores, and has been told her breathing gets shallow at night.    She goes to sleep between 830 and 930 pm.  She falls asleep quickly some nights, but other nights it can take an hour.  She wakes up several times to use the bathroom.  She gets out of bed between 5 and 7 am.  She feels tired in the morning.  She denies morning headache.  She does not use anything to help her stay awake.  She uses several medications for anxiety and these can help her fall asleep.  She denies sleep walking, sleep talking, bruxism, or nightmares.  There is no history of restless legs.  She denies sleep hallucinations, sleep paralysis, or cataplexy.  The Epworth score is 6 out of  24.   Physical Exam:  General - No distress ENT - No sinus tenderness, no oral exudate, no LAN, no thyromegaly, TM clear, pupils equal/reactive Cardiac - s1s2 regular, no murmur, pulses symmetric Chest - No wheeze/rales/dullness, good air entry, normal respiratory excursion Back - No focal tenderness Abd - Soft, non-tender, no organomegaly, + bowel sounds Ext - No edema Neuro - Normal strength, cranial nerves intact Skin - No rashes Psych - Normal mood, and behavior  Discussion: She has snoring, sleep disruption, daytime sleepiness, and apnea.  She has hx of Depression and Hypertension.  I am concerned her sleep issues could be related to sleep apnea.  We discussed how sleep apnea can affect various health problems, including risks for hypertension, cardiovascular disease, and diabetes.  We also discussed how sleep disruption can increase risks for accidents, such as while driving.  Weight loss as a means of improving sleep apnea was also  reviewed.  Additional treatment options discussed were CPAP therapy, oral appliance, and surgical intervention.  Assessment/plan:  Suspected sleep apnea. - will arrange for in lab sleep study to further assess  Insomnia. - much of her issues with falling asleep and staying asleep might actually be related to sleep apnea - defer further intervention until her sleep study is reviewed  Anxiety with depression. - explained how poor sleep can impact her mood   Patient Instructions  Will arrange for in lab sleep study Will call to arrange for follow up after sleep study reviewed    Coralyn Helling, M.D. Pager [phone] 03/13/2017, 11:43 AM

## 2017-03-15 ENCOUNTER — Ambulatory Visit: Payer: PRIVATE HEALTH INSURANCE

## 2017-03-18 ENCOUNTER — Other Ambulatory Visit: Payer: Self-pay | Admitting: Family Medicine

## 2017-03-23 ENCOUNTER — Ambulatory Visit (HOSPITAL_BASED_OUTPATIENT_CLINIC_OR_DEPARTMENT_OTHER): Payer: PRIVATE HEALTH INSURANCE | Admitting: Physical Medicine & Rehabilitation

## 2017-03-23 ENCOUNTER — Encounter: Payer: Self-pay | Admitting: Physical Medicine & Rehabilitation

## 2017-03-23 ENCOUNTER — Encounter: Payer: PRIVATE HEALTH INSURANCE | Attending: Physical Medicine & Rehabilitation

## 2017-03-23 VITALS — BP 129/90 | HR 78

## 2017-03-23 DIAGNOSIS — F329 Major depressive disorder, single episode, unspecified: Secondary | ICD-10-CM | POA: Diagnosis not present

## 2017-03-23 DIAGNOSIS — Z86711 Personal history of pulmonary embolism: Secondary | ICD-10-CM | POA: Insufficient documentation

## 2017-03-23 DIAGNOSIS — M4802 Spinal stenosis, cervical region: Secondary | ICD-10-CM | POA: Diagnosis not present

## 2017-03-23 DIAGNOSIS — M542 Cervicalgia: Secondary | ICD-10-CM | POA: Insufficient documentation

## 2017-03-23 DIAGNOSIS — R413 Other amnesia: Secondary | ICD-10-CM | POA: Diagnosis not present

## 2017-03-23 DIAGNOSIS — Z7901 Long term (current) use of anticoagulants: Secondary | ICD-10-CM | POA: Insufficient documentation

## 2017-03-23 DIAGNOSIS — M47896 Other spondylosis, lumbar region: Secondary | ICD-10-CM | POA: Insufficient documentation

## 2017-03-23 DIAGNOSIS — K219 Gastro-esophageal reflux disease without esophagitis: Secondary | ICD-10-CM | POA: Insufficient documentation

## 2017-03-23 DIAGNOSIS — G8929 Other chronic pain: Secondary | ICD-10-CM | POA: Insufficient documentation

## 2017-03-23 DIAGNOSIS — M1288 Other specific arthropathies, not elsewhere classified, other specified site: Secondary | ICD-10-CM | POA: Insufficient documentation

## 2017-03-23 DIAGNOSIS — M545 Low back pain: Secondary | ICD-10-CM | POA: Insufficient documentation

## 2017-03-23 DIAGNOSIS — M797 Fibromyalgia: Secondary | ICD-10-CM | POA: Diagnosis present

## 2017-03-23 DIAGNOSIS — I10 Essential (primary) hypertension: Secondary | ICD-10-CM | POA: Diagnosis not present

## 2017-03-23 MED ORDER — PREGABALIN 25 MG PO CAPS: 25.0000 mg | ORAL_CAPSULE | Freq: Three times a day (TID) | ORAL | 5 refills | Status: DC

## 2017-03-23 NOTE — Patient Instructions (Signed)
Dr Toniann FailWendy McNeil-Eagle Physician   Dr Barnet PallAaron Morrow Eagle  Dr Lezlie OctaveKate Tabori Marrowbone

## 2017-03-23 NOTE — Progress Notes (Signed)
Subjective:    Patient ID: Faith Thomas, female    DOB: 09/14/1960, 55 y.o.   MRN: [id number]  HPI  Pain Inventory   Subjective:    Patient ID: Faith Thomas, female    DOB: Jun 27, 1960, 56 y.o.   MRN: [id number]  HPI  9/24 Left low back and Right buttocks pain overall improved Pt was gardening and cooking a lot more before her back and hip was bothering her Driving tolerance ~7WG. Doing ok with work activity uses hi/Low Desk  Walking time ~25min /day up from 20-30 min  No side effects from Lyrica. Goal of Treatment is to come off of some of her medications, specifically Tylenol and Flexeril  10/19 3 hour drive to a reunion Off ranitidine Started more aggressive Core ex Single leg extensions and planks Was doing daily core "flare up" of Low back and buttock Seen by DC, manual work PT adjusted exercise program, no planks Taking 2 tramadol per day during flare Walking 50-60 minutes a day Yoga every other day   03/23/2017 Average Pain 5 Pain Right Now 3 My pain is stabbing and aching  In the last 24 hours, has pain interfered with the following? General activity 5 Relation with others 3 Enjoyment of life 4 What TIME of day is your pain at its worst? varies Sleep (in general) Fair  Pain is worse with: varies Pain improves with: heat/ice and medication Relief from Meds: .  Mobility walk without assistance ability to climb steps?  yes do you drive?  yes  Function employed # of hrs/week 40  Neuro/Psych weakness dizziness confusion depression anxiety  Prior Studies Any changes since last visit?  no  Physicians involved in your care Any changes since last visit?  no   Family History  Problem Relation Age of Onset  . Arthritis Mother   . Hypertension Mother   . Mental illness Mother   . Arthritis Father   . Hypertension Father   . Mental illness Father   . Mental illness Brother   . Arthritis Daughter   . Cancer Maternal Aunt    . Hypertension Maternal Grandmother   . Arthritis Maternal Grandfather    Social History   Social History  . Marital status: Divorced    Spouse name: N/A  . Number of children: N/A  . Years of education: N/A   Social History Main Topics  . Smoking status: Never Smoker  . Smokeless tobacco: Never Used  . Alcohol use 0.0 oz/week     Comment: occasional   . Drug use: No  . Sexual activity: Not on file   Other Topics Concern  . 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 Past Medical History:  Diagnosis Date  . Allergy   . Depression   . DVT (deep venous thrombosis) (HCC)   . Fibromyalgia   . Genetic defect   . GERD (gastroesophageal reflux disease)   . Hypertension   . PE (pulmonary thromboembolism) (HCC)   . Positive TB test   . Urine incontinence    There were no vitals taken for this visit.  Opioid Risk Score:   Fall Risk Score:  `  1  Depression screen PHQ 2/9  No flowsheet data found.   Review of Systems  Constitutional: Negative.   HENT: Negative.   Eyes: Negative.   Respiratory: Negative.   Cardiovascular: Negative.   Gastrointestinal: Negative.   Endocrine: Negative.   Genitourinary: Negative.   Musculoskeletal: Negative.   Skin: Negative.   Allergic/Immunologic: Negative.   Neurological: Negative.   Hematological: Negative.   Psychiatric/Behavioral: Negative.   All other systems reviewed and are negative.      Objective:   Physical Exam  Constitutional: She is oriented to person, place, and time. She appears well-developed and well-nourished. No distress.  HENT:  Head: Normocephalic and atraumatic.  Eyes: Pupils are equal, round, and reactive to light. Conjunctivae and EOM are  normal.  Neck: Normal range of motion. Neck supple.  Neurological: She is alert and oriented to person, place, and time. She displays no tremor. She exhibits normal muscle tone. Coordination and gait normal.  Reflex Scores:      Patellar reflexes are 2+ on the right side and 2+ on the left side.      Achilles reflexes are 2+ on the right side and 2+ on the left side. Skin: She is not diaphoretic.  Psychiatric: She has a normal mood and affect.  Nursing note and vitals reviewed.  Motor strength is 5/5 bilateral deltoids, biceps, triceps, grip, hip flexor, knee extensor, ankle dorsiflexor and plantar flexor  She has tenderness to palpation over the left lumbar paraspinals L1-L5. There is spasm on the left side  Negative straight leg raising test. Hip, knee, ankle range of motion is full      Assessment & Plan:  1. Fibromyalgia syndrome: Continue Lyrica 25 mg 3 times a day she has no side effects from this medication and has allowed her to increase her activity level.  2. Chronic low back pain. She is working on increasing core strength, increased her exercise program rapidly last week and had flareup of lumbar myofascial pain. At this point she is doing better, does not really need trigger point injections. Should she have another flareup that does not resolve with PT and chiropractic treatments. She can come in for this. We discussed her Eliquis, this can be on board with trigger point injections, but not with medial branch blocks.  Over half of the 25 min visit was spent counseling and coordinating care.

## 2017-04-02 ENCOUNTER — Other Ambulatory Visit: Payer: Self-pay | Admitting: Physical Medicine & Rehabilitation

## 2017-04-02 ENCOUNTER — Other Ambulatory Visit: Payer: Self-pay

## 2017-04-02 NOTE — Telephone Encounter (Signed)
Viewed last note, no mention of continuing this medication, also last note states:  Goal of Treatment is to come off of some of her medications, specifically Tylenol and Flexeril  Please advise

## 2017-04-02 NOTE — Telephone Encounter (Signed)
Call in tramadol 4450milligrams# 30, no refills 1 tablet daily as needed

## 2017-04-02 NOTE — Telephone Encounter (Signed)
Patient called requesting refill for tramadol and states she is having severe pain.

## 2017-04-03 ENCOUNTER — Other Ambulatory Visit: Payer: Self-pay | Admitting: Physical Medicine & Rehabilitation

## 2017-04-03 MED ORDER — TRAMADOL HCL 50 MG PO TABS: 50.0000 mg | ORAL_TABLET | Freq: Every day | ORAL | 0 refills | Status: DC | PRN

## 2017-04-03 NOTE — Telephone Encounter (Signed)
Medication called into pharmacy and patient notified.

## 2017-05-04 ENCOUNTER — Ambulatory Visit (HOSPITAL_BASED_OUTPATIENT_CLINIC_OR_DEPARTMENT_OTHER): Payer: PRIVATE HEALTH INSURANCE | Attending: Pulmonary Disease | Admitting: Pulmonary Disease

## 2017-05-04 DIAGNOSIS — R0683 Snoring: Secondary | ICD-10-CM | POA: Insufficient documentation

## 2017-05-04 DIAGNOSIS — G4700 Insomnia, unspecified: Secondary | ICD-10-CM | POA: Insufficient documentation

## 2017-05-04 DIAGNOSIS — R29818 Other symptoms and signs involving the nervous system: Secondary | ICD-10-CM | POA: Diagnosis not present

## 2017-05-05 ENCOUNTER — Telehealth: Payer: Self-pay | Admitting: Pulmonary Disease

## 2017-05-05 NOTE — Telephone Encounter (Signed)
PSG 05/04/17 >> AHI 1.6, SpO2 low 89%, PLMI 11.03.  Reduced REM, no SWS.   Please let her know that her sleep study did not show significant sleep apnea.  Please schedule next available appointment with me to review test results in more detail.

## 2017-05-05 NOTE — Procedures (Signed)
   Patient Name: Faith Thomas, Ameenah Study Date: 05/04/2017 Gender: Female D.O.B: 05/31/1961 Age (years): [age] Referring Provider: Coralyn HellingVineet Chivon Lepage MD, ABSM Height (inches): 65 Interpreting Physician: Coralyn HellingVineet Larone Kliethermes MD, ABSM Weight (lbs): 130 RPSGT: Rolene ArbourMcConnico, Yvonne BMI: 22 MRN: [id number] Neck Size: 13.00  CLINICAL INFORMATION Sleep Study Type: NPSG  Indication for sleep study: N/A  Epworth Sleepiness Score: 2  SLEEP STUDY TECHNIQUE As per the AASM Manual for the Scoring of Sleep and Associated Events v2.3 (April 2016) with a hypopnea requiring 4% desaturations.  The channels recorded and monitored were frontal, central and occipital EEG, electrooculogram (EOG), submentalis EMG (chin), nasal and oral airflow, thoracic and abdominal wall motion, anterior tibialis EMG, snore microphone, electrocardiogram, and pulse oximetry.  MEDICATIONS Medications self-administered by patient taken the night of the study : ALPRAZOLAM, ELIQUIS, BUSPIRONE HCL, FLEXERIL, LYRICA, SERTRALINE, SILENOR  SLEEP ARCHITECTURE The study was initiated at 10:18:25 PM and ended at 4:48:18 AM.  Sleep onset time was 31.8 minutes and the sleep efficiency was 78.1%. The total sleep time was 304.6 minutes.  Stage REM latency was 301.5 minutes.  The patient spent 1.64% of the night in stage N1 sleep, 94.91% in stage N2 sleep, 0.00% in stage N3 and 3.45% in REM.  Alpha intrusion was absent.  Supine sleep was 62.57%.  RESPIRATORY PARAMETERS The overall apnea/hypopnea index (AHI) was 1.6 per hour. There were 4 total apneas, including 4 obstructive, 0 central and 0 mixed apneas. There were 4 hypopneas and 12 RERAs.  The AHI during Stage REM sleep was 0.0 per hour.  AHI while supine was 2.5 per hour.  The mean oxygen saturation was 95.63%. The minimum SpO2 during sleep was 89.00%.  moderate snoring was noted during this study.  CARDIAC DATA The 2 lead EKG demonstrated sinus rhythm. The mean heart rate was 70.76  beats per minute. Other EKG findings include: None.  LEG MOVEMENT DATA The total PLMS were 56 with a resulting PLMS index of 11.03. Associated arousal with leg movement index was 0.2 .  IMPRESSIONS - While she had a few obstructive respiratory events, these were not frequent enough to qualify for a diagnosis of obstructive sleep apnea.  Her AHI was 1.6 with an SpO2 low of 89%. - She did not have significant increase in her periodic limb movement index. - She had a reduced percentage of REM sleep and not observed in slow wave sleep during this study.  DIAGNOSIS - Snoring (R06.83)  RECOMMENDATIONS - Avoid alcohol, sedatives and other CNS depressants that may worsen sleep apnea and disrupt normal sleep architecture. - Sleep hygiene should be reviewed to assess factors that may improve sleep quality. - Weight management and regular exercise should be initiated or continued if appropriate.  [Electronically signed] 05/05/2017 02:52 PM  Coralyn HellingVineet Rashon Rezek MD, ABSM Diplomate, American Board of Sleep Medicine   NPI: [id number]

## 2017-05-07 NOTE — Telephone Encounter (Signed)
Pt has been scheduled for 05/09/17 with VS. Nothing further needed.

## 2017-05-07 NOTE — Telephone Encounter (Signed)
Left voice mail on machine for patient to return phone call back regarding results and recommendations. Per VS he wants patient to schedule a ROV asap with VS to go over the sleep study more in depth and detailed.

## 2017-05-07 NOTE — Telephone Encounter (Signed)
Patient returned call.  Scheduled ROV with VS for 12/05.

## 2017-05-09 ENCOUNTER — Encounter: Payer: Self-pay | Admitting: Pulmonary Disease

## 2017-05-09 ENCOUNTER — Ambulatory Visit (INDEPENDENT_AMBULATORY_CARE_PROVIDER_SITE_OTHER): Payer: PRIVATE HEALTH INSURANCE | Admitting: Pulmonary Disease

## 2017-05-09 VITALS — BP 112/80 | HR 77 | Ht 65.0 in | Wt 137.0 lb

## 2017-05-09 DIAGNOSIS — R0683 Snoring: Secondary | ICD-10-CM

## 2017-05-09 DIAGNOSIS — G4700 Insomnia, unspecified: Secondary | ICD-10-CM

## 2017-05-09 NOTE — Patient Instructions (Signed)
Will arrange for referral to Dr. Althea GrimmerMark Katz to assess for oral appliance to treat snoring  Follow up in 6 weeks

## 2017-05-09 NOTE — Progress Notes (Signed)
Current Outpatient Medications on File Prior to Visit  Medication Sig  . acetaminophen (TYLENOL) 500 MG tablet Take 1,000 mg by mouth every 8 (eight) hours as needed.   . ALPRAZolam (XANAX) 0.5 MG tablet Take 0.5 mg by mouth at bedtime as needed for anxiety.   Marland Kitchen. apixaban (ELIQUIS) 5 MG TABS tablet Take 1 tablet (5 mg total) by mouth 2 (two) times daily with a meal.  . b complex vitamins capsule Take 1 capsule by mouth daily.  . busPIRone (BUSPAR) 30 MG tablet Take 30 mg by mouth 2 (two) times daily.  . Cholecalciferol (VITAMIN D-3) 1000 units CAPS Take 10,000 Units by mouth daily.   . cyclobenzaprine (FLEXERIL) 5 MG tablet TAKE 1 TABLET BY MOUTH TWICE A DAY AS NEEDED FOR MUSCLE SPASMS  . L-Methylfolate-Algae (DEPLIN 15 PO) Take by mouth.  Marland Kitchen. lisinopril (PRINIVIL,ZESTRIL) 10 MG tablet Take 1 tablet (10 mg total) by mouth daily.  . mometasone (NASONEX) 50 MCG/ACT nasal spray Place 2 sprays into the nose as needed.  . Omega 3 1200 MG CAPS Take 1,200 mg by mouth daily.  . pregabalin (LYRICA) 25 MG capsule Take 1 capsule (25 mg total) by mouth 3 (three) times daily.  . Probiotic Product (PROBIOTIC PO) Take 1 capsule by mouth daily.   . S-Adenosylmethionine (SAM-E) 400 MG TABS Take by mouth.  . sertraline (ZOLOFT) 100 MG tablet Take 200 mg by mouth at bedtime.   Marland Kitchen. SILENOR 6 MG TABS Take 1 tablet by mouth at bedtime.  . traMADol (ULTRAM) 50 MG tablet Take 1 tablet (50 mg total) by mouth daily as needed.  . vitamin C (ASCORBIC ACID) 500 MG tablet Take 1,000 mg by mouth daily.    No current facility-administered medications on file prior to visit.      Chief Complaint  Patient presents with  . Follow-up    Pt is here to discuss sleep study results.     Sleep tests PSG 05/04/17 >> AHI 1.6, SpO2 low 89%, PLMI 11.03.  Reduced REM, no SWS.  Pulmonary tests CT angio chest 10/26/16 >> large PE  Cardiac tests Echo 10/26/16 >> EF 65 to 70%, grade 1 DD, PAS 42 mmHg, severe RV systolic  dysfx  Past medical history Depression, PE, DVT, Allergies, Fibromyalgia, GERD, HTN, PPD positive  Past surgical history, Family history, Social history, Allergies all reviewed.  Vital Signs BP 112/80 (BP Location: Left Arm, Cuff Size: Normal)   Pulse 77   Ht 5\' 5"  (1.651 m)   Wt 137 lb (62.1 kg)   SpO2 98%   BMI 22.80 kg/m   History of Present Illness Faith Thomas is a 56 y.o. female with sleep difficulties.  She is here to review her sleep study.  She had snoring, and a few respiratory events but not enough to qualify for dx of sleep apnea.  She had a few leg movements.  She denies symptoms of RLS.  She takes xanax to help fall asleep.  This helps. But then she wakes up during the course of the night.  She was started on silenor with plan to wean off xanax.  She feels fatigued and sleepy during the day.  She has been working with her chiropractor and physical therapist.  She was told she has to sleep on her back.  Her mood is okay at present, but this is related to her current medication regimen.  Physical Exam  General - pleasant Eyes - pupils reactive ENT - no sinus tenderness, no  oral exudate, no LAN Cardiac - regular, no murmur Chest - no wheeze, rales Abd - soft, non tender Ext - no edema Skin - no rashes Neuro - normal strength Psych - normal mood    Assessment/Plan  Snoring. - she would like to investigate an oral appliance - will refer her to Dr. Althea GrimmerMark Katz  Insomnia. - she is transitioning some of her medications - will monitor this - if her symptoms persist, then consider adding belsomra  Back/hip pain. - advised her to d/w her chiropractor and physical therapist about whether she could sleep in different positions  Patient Instructions  Will arrange for referral to Dr. Althea GrimmerMark Katz to assess for oral appliance to treat snoring  Follow up in 6 weeks   Time spent 27 minutes  Coralyn HellingVineet Barron Vanloan, MD [facility] Pager:   [phone] 05/09/2017, 1:41 PM

## 2017-05-18 ENCOUNTER — Other Ambulatory Visit: Payer: Self-pay | Admitting: Physical Medicine & Rehabilitation

## 2017-05-18 NOTE — Telephone Encounter (Signed)
Patient called requesting a refill of tramadol, last message for this was dated on 04-02-17, and the refill approval stated to call pharmacy with tramadol 50mg  30tabs 1 daily with 0 refills  Is it ok to refill this medication for this patient, please advise

## 2017-05-18 NOTE — Telephone Encounter (Signed)
Patient is needing a refill on Tramadol. °

## 2017-05-18 NOTE — Telephone Encounter (Signed)
May refill tramadol 1 mo supply with 1 refill

## 2017-05-19 ENCOUNTER — Other Ambulatory Visit: Payer: Self-pay | Admitting: Family Medicine

## 2017-05-21 MED ORDER — TRAMADOL HCL 50 MG PO TABS: 50.0000 mg | ORAL_TABLET | Freq: Every day | ORAL | 1 refills | Status: DC | PRN

## 2017-05-21 NOTE — Telephone Encounter (Addendum)
Medication reordered and called into pharmacy

## 2017-06-01 ENCOUNTER — Encounter (HOSPITAL_BASED_OUTPATIENT_CLINIC_OR_DEPARTMENT_OTHER): Payer: PRIVATE HEALTH INSURANCE | Admitting: Psychology

## 2017-06-01 ENCOUNTER — Encounter: Payer: Self-pay | Admitting: Psychology

## 2017-06-01 ENCOUNTER — Encounter: Payer: PRIVATE HEALTH INSURANCE | Attending: Physical Medicine & Rehabilitation | Admitting: Psychology

## 2017-06-01 DIAGNOSIS — M545 Low back pain: Secondary | ICD-10-CM | POA: Insufficient documentation

## 2017-06-01 DIAGNOSIS — K219 Gastro-esophageal reflux disease without esophagitis: Secondary | ICD-10-CM | POA: Insufficient documentation

## 2017-06-01 DIAGNOSIS — M542 Cervicalgia: Secondary | ICD-10-CM | POA: Diagnosis not present

## 2017-06-01 DIAGNOSIS — Z1589 Genetic susceptibility to other disease: Secondary | ICD-10-CM

## 2017-06-01 DIAGNOSIS — F329 Major depressive disorder, single episode, unspecified: Secondary | ICD-10-CM | POA: Insufficient documentation

## 2017-06-01 DIAGNOSIS — M1288 Other specific arthropathies, not elsewhere classified, other specified site: Secondary | ICD-10-CM | POA: Diagnosis not present

## 2017-06-01 DIAGNOSIS — G8929 Other chronic pain: Secondary | ICD-10-CM | POA: Insufficient documentation

## 2017-06-01 DIAGNOSIS — E7212 Methylenetetrahydrofolate reductase deficiency: Secondary | ICD-10-CM | POA: Diagnosis not present

## 2017-06-01 DIAGNOSIS — R413 Other amnesia: Secondary | ICD-10-CM | POA: Diagnosis not present

## 2017-06-01 DIAGNOSIS — M4802 Spinal stenosis, cervical region: Secondary | ICD-10-CM | POA: Diagnosis not present

## 2017-06-01 DIAGNOSIS — M797 Fibromyalgia: Secondary | ICD-10-CM | POA: Insufficient documentation

## 2017-06-01 DIAGNOSIS — Z7901 Long term (current) use of anticoagulants: Secondary | ICD-10-CM | POA: Insufficient documentation

## 2017-06-01 DIAGNOSIS — I10 Essential (primary) hypertension: Secondary | ICD-10-CM | POA: Insufficient documentation

## 2017-06-01 DIAGNOSIS — M47896 Other spondylosis, lumbar region: Secondary | ICD-10-CM | POA: Insufficient documentation

## 2017-06-01 DIAGNOSIS — Z86711 Personal history of pulmonary embolism: Secondary | ICD-10-CM | POA: Insufficient documentation

## 2017-06-01 NOTE — Progress Notes (Signed)
Administered the WAIS-IV and the WMS-IV  Four hours of testing.

## 2017-06-03 ENCOUNTER — Encounter: Payer: Self-pay | Admitting: Physical Medicine & Rehabilitation

## 2017-06-04 ENCOUNTER — Encounter: Payer: Self-pay | Admitting: Psychology

## 2017-06-04 ENCOUNTER — Encounter (HOSPITAL_BASED_OUTPATIENT_CLINIC_OR_DEPARTMENT_OTHER): Payer: PRIVATE HEALTH INSURANCE | Admitting: Psychology

## 2017-06-04 DIAGNOSIS — R413 Other amnesia: Secondary | ICD-10-CM | POA: Diagnosis not present

## 2017-06-04 DIAGNOSIS — G8929 Other chronic pain: Secondary | ICD-10-CM | POA: Diagnosis not present

## 2017-06-04 DIAGNOSIS — M797 Fibromyalgia: Secondary | ICD-10-CM

## 2017-06-04 NOTE — Progress Notes (Signed)
Neuropsychological Consultation   Patient:   Faith Thomas   DOB:   1961-01-29  MR Number:  [id number]  Location:  [facility] PHYS[facility] MEDICINE AND REHABILITATION [address] [place] [id number] [place] [address] Dept: [phone]           Date of Service:   06/01/2017  Start Time:   8 AM End Time:   9 AM  Provider/Observer:  Arley Phenix, Psy.D.       Clinical Neuropsychologist       Billing Code/Service: [phone] 4 Units  Chief Complaint:    Faith Thomas is a 56 year old female who is referred by Dr. Wynn Banker for neuropsychological evaluation due to complaints about memory and other cognitive deficits.  The patient is also been dealing with depressive symptoms that have improved as well as improved anxiety symptoms.  She also has had adverse responses to Cymbalta.  The patient has been confirmed to have the MTFHR genetic mutation and has been hospitalized in past due to complications from this condition.  Reason for Service:  Faith Thomas was referred for neuropsychological evaluation due to ongoing cognitive difficulties.  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  The patient reports that she initially started experiencing significant pain when she was around 56 years of age.  Last summer she had blood clots in her lungs and enlarged heart diagnosed.  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  The patient reports that after a psychiatrist identified this gene when trying to figure out why she is dealing with such depression and anxiety symptoms and had a severe adverse reaction to  Cymbalta, that her symptoms have improved a great deal overall.  However, she is continued to be concerned about cognitive issues that have persisted since stopping the Cymbalta and having her MTF HR genetic mutation better managed.  The patient also reports that she has a brother who has 3 of these mutations as well and he has been so severely impacted by this condition that he is essentially housebound.  The patient also has a daughter who has mild autism.  She reports that this is 1 of her stressors that she has been coping with as the daughter has begun a relationship with an older man and she is now pregnant as part of this relationship.  The patient reports that another major stressor for her is the separation of her parents.  The patient describes some ongoing issues with memory impairments and cognitive issues.  She has concerns about how these memory and cognitive issues will impact her work and job status.  The patient has been working in a call center for [employer].  In this job she gets crisis calls ranging from dealing with acute suicidal ideation to traumatic brain injury and other mental health issues.  The patient reports that she likes this job and at times it can range from being rather boring to getting a lot of calls.  However, she reports that there are very positive things about this job.  However, she is being faced with opportunities to move up to jobs within  the organization that have more responsibility and are of a more challenging nature.  However, the patient is concerned that her memory and cognitive functioning might not be sufficient to perform well at this job.  The patient reports that her expressive language was impacted when she was at her worst as far as her condition immediately after her adverse response to Cymbalta.  She reports that her language skills have returned to baseline but she continues to have what she perceives as memory and cognitive  difficulties.  Current Status:  The patient reports ongoing mild to moderate cognitive difficulties and memory issues.  However, she reports that she has made significant improvements with proper medical treatment for her MT HFR genetic mutation.  She reports that her depression and anxiety have improved significantly proper treatment.  However, as she has improved she is trying to determine whether she is ready to increase her job/occupational responsibilities and work requirements.  Reliability of Information: The information is derived from a 1 hour face-to-face clinical interview with the patient as well as review of available medical records.  Behavioral Observation: Faith Thomas  presents as a 56 y.o.-year-old Right Caucasian Female who appeared her stated age. her dress was Appropriate and she was Well Groomed and her manners were Appropriate to the situation.  her participation was indicative of Appropriate and Attentive behaviors.  There were not any physical disabilities noted.  she displayed an appropriate level of cooperation and motivation.     Interactions:    Active Appropriate and Attentive  Attention:   within normal limits and attention span and concentration were age appropriate  Memory:   within normal limits; recent and remote memory intact  Visuo-spatial:  within normal limits  Speech (Volume):  normal  Speech:   normal;   Thought Process:  Coherent and Relevant  Though Content:  WNL; not suicidal  Orientation:   person, place, time/date and situation  Judgment:   Good  Planning:   Good  Affect:    Appropriate  Mood:    Euthymic  Insight:   Good  Intelligence:   high  Current Employment: The patient is currently working at a call center for [employer] and receives calls for acute psychological and behavioral issues and other behavioral health issues for possible referral.  Substance Use:  No concerns of substance abuse are reported.     Education:   [employer]  Medical History:   Past Medical History:  Diagnosis Date  . Allergy   . Depression   . DVT (deep venous thrombosis) (HCC)   . Fibromyalgia   . Genetic defect   . GERD (gastroesophageal reflux disease)   . Hypertension   . PE (pulmonary thromboembolism) (HCC)   . Positive TB test   . Urine incontinence         Psychiatric History:  The patient has begun to deal with issues of depression anxiety and other cognitive difficulties.  Initially there were attempts to treat her depression anxiety with SSRI medications.  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  Psychiatrist was able to have her tested for the MT HFR gene mutation and she was found to have this mutation.  This genetic mutation interferes with a conversion of essential B vitamins including folate acid and B12.  The impairment of the ability to make a particular amino acid, methionine can interfere in the proper production of proteins and  neurotransmitters such as serotonin, dopamine, norepinephrine.  This issue can also lead to elevated levels of homocystine which can lead to numerous other health problems.  Family Med/Psych History:  Family History  Problem Relation Age of Onset  . Arthritis Mother   . Hypertension Mother   . Mental illness Mother   . Arthritis Father   . Hypertension Father   . Mental illness Father   . Mental illness Brother   . Arthritis Daughter   . Cancer Maternal Aunt   . Hypertension Maternal Grandmother   . Arthritis Maternal Grandfather     Risk of Suicide/Violence: virtually non-existent   Impression/DX:  Faith Thomas is a 56 year old female who is referred by Dr. Wynn BankerKirsteins for neuropsychological evaluation due to complaints about memory and other cognitive deficits.  The patient is also been dealing with depressive symptoms that have improved  as well as improved anxiety symptoms.  She also has had adverse responses to Cymbalta.  The patient has been confirmed to have the MTFHR genetic mutation and has been hospitalized in past due to complications from this condition.  The patient reports ongoing mild to moderate cognitive difficulties and memory issues.  However, she reports that she has made significant improvements with proper medical treatment for her MT HFR genetic mutation.  She reports that her depression and anxiety have improved significantly proper treatment.  However, as she has improved she is trying to determine whether she is ready to increase her job/occupational responsibilities and work requirements.  The patient has begun to deal with issues of depression anxiety and other cognitive difficulties.  Initially there were attempts to treat her depression anxiety with SSRI medications.  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  Psychiatrist was able to have her tested for the MT HFR gene mutation and she was found to have this mutation.  This genetic mutation interferes with a conversion of essential B vitamins including folate acid and B12.  The impairment of the ability to make a particular amino acid, methionine can interfere in the proper production of proteins and neurotransmitters such as serotonin, dopamine, norepinephrine.  This issue can also lead to elevated levels of homocystine which can lead to numerous other health problems.  Disposition/Plan:  We will complete a neuropsychological battery consisting of the Wechsler Adult Intelligence Scale-IV as well as the Wechsler Memory Scale to facilitate ongoing treatment plans related to her overall cognitive functioning and issues brought up with regard to this having to do with occupation and work situation.  Diagnosis:    Memory loss due to medical  condition  MTHFR gene mutation [facility](HCC)         Electronically Signed   _______________________ Arley PhenixJohn Daniesha Driver, Psy.D.

## 2017-06-04 NOTE — Progress Notes (Addendum)
Patient:  Faith Thomas   DOB: 16-Feb-1961  MR Number: [id number]  Location: [facility] FOR PA[facility] AND REHABILITATIVE MEDICINE [facility] PHYSICAL MEDICINE AND REHABILITATION [address], [place] [address] [id number] [place] [address] Dept: [phone]  Start: 4 PM End: 5 PM  Provider/Observer:     Hershal Coria PSYD  Chief Complaint:     No chief complaint on file.   Reason For Service:     Faith Thomas was referred for neuropsychological evaluation due to ongoing cognitive difficulties.  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  The patient reports that she initially started experiencing significant pain when she was around 56 years of age.  Last summer she had blood clots in her lungs and enlarged heart diagnosed.  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  The patient reports that after a psychiatrist identified this gene when trying to figure out why she is dealing with such depression and anxiety symptoms and had a severe adverse reaction to Cymbalta, that her symptoms have improved a great deal overall.  However, she is continued to be concerned about cognitive issues that have persisted since stopping the Cymbalta and having her MTF HR genetic mutation better managed.  The patient also reports that she has a brother who has 3 of these mutations as well and he has been so severely impacted by this condition that he is essentially housebound.  The patient also has a daughter who has mild autism.  She reports that this is 1 of her stressors that she has been coping with as the daughter has begun a relationship with an older man and she is now pregnant  as part of this relationship.  The patient reports that another major stressor for her is the separation of her parents.  The patient describes some ongoing issues with memory impairments and cognitive issues.  She has concerns about how these memory and cognitive issues will impact her work and job status.  The patient has been working in a call center for [employer].  In this job she gets crisis calls ranging from dealing with acute suicidal ideation to traumatic brain injury and other mental health issues.  The patient reports that she likes this job and at times it can range from being rather boring to getting a lot of calls.  However, she reports that there are very positive things about this job.  However, she is being faced with opportunities to move up to jobs within the organization that have more responsibility and are of a more challenging nature.  However, the patient is concerned that her memory and cognitive functioning might not be sufficient to perform well at this job.  The patient reports that her expressive language was impacted when she was at her worst as far as her condition immediately after her adverse response to Cymbalta.  She reports that her language skills have returned to baseline but she continues to have what she perceives as memory and cognitive difficulties.  Testing Administered:  The patient was administered the Wechsler Adult Research officer, trade union as well as the [employer] Scale-IV.    Participation Level:  Active  Participation Quality:  Appropriate and Attentive      Behavioral Observation:  Well Groomed, Alert, and Appropriate.   Test Results:   The patient initially was administered the Wechsler Adult Intelligence Scale-IV.  Complete statistical results can be found at the end of this report.  Overall, the patient produced a full scale IQ score of 120 which puts her global performance at the 91st percentile.  An alternative method for looking at  global abilities is the general abilities index score which was a 124 and falls at the 95th percentile.  Both of these global indices fall in the superior range of functioning.  Other composite/index scores include her verbal comprehension index score which was a 134 and fell at the 99th percentile.  This performance was in the very superior range.  The patient's perceptual reasoning was a 109 and fell at the 73rd percentile and was in the average range.  The patient's working memory produced a composite score of 114 which falls in the 82nd percentile and was in the high average range of functioning.  The patient's greatest relative weakness but still a average performance relative to the normative population was with regard to her overall information processing speed.  The patient produced a composite score related to a processing speed index of 105 which falls at the 63rd percentile and was in the average range.  Composite Score Summary  Scale Sum of Scaled Scores Composite Score Percentile Rank 95% Conf. Interval Qualitative Description  Verbal Comprehension 47 VCI 134 99 127-138 Very Superior  Perceptual Reasoning 35 PRI 109 73 102-115 Average  Working Memory 25 WMI 114 82 106-120 High Average  Processing Speed 22 PSI 105 63 96-113 Average  Full Scale 129 FSIQ 120 91 116-124 Superior  General Ability 82 GAI 124 95 118-128 Superior    As far as individual subtest performance, the patient had subtest performance within the verbal comprehension index all falling in the high average to superior range of functioning.  This included exceptional abilities with regard to verbal reasoning and problem-solving, her vocabulary abilities, her general fund of information as well as her social judgment and comprehension abilities.  Subtest performance within the perceptual reasoning index score ranged from the average to superior range of functioning.  Measures of verbal reasoning and problem-solving were  generally in the average range but her ability to do more complex estimation and problem-solving spell in the superior range of functioning.  The patient is working Office managermemory subtest performance generally were in the upper end of the average range.  The patient's auditory encoding ability to hold information in her attention span for processing that were all in the average range.  The patient's processing speed index produce subtest performances in the average range.  Overall, the patient's visual searching, visual scanning and the overall speed of mental operations generally fell in the average range and around the 50th percentile.  The patient was then administered the Wechsler Memory Scale-IV.  The patient produced some significant variability within these primary index scores.  Index Score Summary  Index Sum of Scaled Scores Index Score Percentile Rank 95% Confidence Interval Qualitative Descriptor  Auditory Memory (AMI) 56 124 95 117-129 Superior  Visual Memory (VMI) 37 95 37 90-101 Average  Visual Working Memory (VWMI) 20 100 50 93-107 Average  Immediate Memory (IMI) 46 109 73 102-115 Average  Delayed Memory (DMI) 47 112 79 105-118 High Average   For example, the patient's visual memory and visual working memory were  in the average range while her auditory memory was in the superior range.  When these performances are compared to a predictive model based on her global abilities index score of 124 the patient does show indications of significant relative memory weaknesses and deficits.  Even though her visual memory performances are in the average range they are significantly below those levels that would be predicted on her global abilities indices.  The patient's auditory memory is consistent with predicted levels with an auditory memory index score of 124.  The patient also did well on her delayed memory index score which combines both delayed auditory and delayed visual memory.  However,  there were some specific weaknesses relative to her predictions based on premorbid functioning with regard to visual memory and learning.  Summary of Results:   The results of the current neuropsychological assessment do suggest some specific areas of neuropsychological deficits and cognitive decline.  The primary areas has to do with issues related to visual memory and visual working memory.  While these performances are generally in the average range they are significantly below (nearly 25 points below predicted levels) predicted levels for premorbid functioning.  The patient's verbal comprehension index score suggest prior functioning in the superior range.  The patient's auditory memory and learning also falls in the superior range of functioning.  However, the patient is having difficulties with regard to overall speed of mental operations, visual spatial and perceptual reasoning, and visual memory and learning.  This pattern suggest that the primary areas of neurological changes are in the right hemisphere in general.  Impression/Diagnosis:   The results of the current neuropsychological evaluation are consistent with specific neuropsychological deficits relative to premorbid functioning.  The patient is producing performances that are in the superior range with regard to certain aspects of cognitive functioning.  These include verbal comprehension and verbal reasoning abilities as well as her auditory encoding abilities.  The patient's overall speed of mental operations and visual spatial and visual perceptual abilities are significantly below predicted levels.  The patient is showing primary memory deficits related to visual memory and learning.  The patient displays superior auditory learning and memory.  Overall, there are specific neuropsychological deficits suggested in the current assessment.  They do appear to be primarily related to changes in right hemispheric functioning and are consistent with  the patient's descriptions of ongoing cognitive difficulties.  The pattern of strengths and weaknesses are not consistent with those typically seen as deficits related to depression or other psychological/psychiatric issues.  The memory deficits may very well be related to her MTF-HR gene mutation.  The effects this mutation has on processing certain B vitamins can have a significant negative impact on the neurological functioning related to memory and learning.  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 Neuropsychologist        WAIS-IV Wechsler Adult Intelligence Scale-Fourth Edition Score Report   Examinee Name Vida Nicol  Date of Report 2017/06/04  Cristy Friedlander [id number]  Years of Education   Date of Birth 08-21-1960  Primary Language   Gender Female  Handedness   Race/Ethnicity   Examiner Name Arley Phenix  Date of Testing 2017/06/01  Age at Testing 56 years 3 months  Retest? No   Comments: Composite Score Summary  Scale Sum of Scaled Scores Composite Score Percentile Rank 95% Conf. Interval Qualitative Description  Verbal Comprehension 47 VCI 134 99 127-138 Very Superior  Perceptual  Reasoning 35 PRI 109 73 102-115 Average  Working Memory 25 WMI 114 82 106-120 High Average  Processing Speed 22 PSI 105 63 96-113 Average  Full Scale 129 FSIQ 120 91 116-124 Superior  General Ability 82 GAI 124 95 118-128 Superior  Confidence Intervals are based on the Overall Average SEMs. The [facility] is an optional composite summary score that is less sensitive to the influence of working memory and processing speed. Because working memory and processing speed are vital to a comprehensive evaluation of cognitive ability, it should be noted that the [facility] does not have the breadth of construct coverage as the FSIQ.    ANALYSIS   Index Level Discrepancy Comparisons  Comparison Score 1 Score 2 Difference Critical Value .05  Significant Difference Y/N Base Rate by Overall Sample  VCI - PRI 134 109 25 7.78 Y 3.6  VCI - WMI 134 114 20 8.31 Y 6.0  VCI - PSI 134 105 29 11.76 Y 3.8  PRI - WMI 109 114 -5 8.81 N 37.1  PRI - PSI 109 105 4 12.12 N 39.4  WMI - PSI 114 105 9 12.47 N 26.2  FSIQ - GAI 120 124 -4 3.29 Y 23.8  Base Rate by Overall Sample. Statistical significance (critical value) at the .05 level.  Verbal Comprehension Subtests Summary  Subtest Raw Score Scaled Score Percentile Rank Reference Group Scaled Score SEM  Similarities 34 16 98 16 1.04  Vocabulary 56 18 99.6 19 0.73  Information 20 13 84 14 0.73  (Comprehension) 36 19 99.9 19 1.16   Perceptual Reasoning Subtests Summary  Subtest Raw Score Scaled Score Percentile Rank Reference Group Scaled Score SEM  Block Design 35 9 37 8 0.95  Matrix Reasoning 19 11 63 10 0.95  Visual Puzzles 20 15 95 13 0.85  (Figure Weights) 20 15 95 12 0.90  (Picture Completion) 19 15 95 14 1.24   Working Librarian, academic Raw Score Scaled Score Percentile Rank Reference Group Scaled Score SEM  Digit Span 32 12 75 12 0.73  Arithmetic 18 13 84 13 0.90  (Letter-Number Seq.) 22 11 63 11 1.04   Processing Speed Subtests Summary  Subtest Raw Score Scaled Score Percentile Rank Reference Group Scaled Score SEM  Symbol Search 30 10 50 9 1.56  Coding 73 12 75 10 1.20  (Cancellation) 39 10 50 9 1.62   Subtest Level Discrepancy Comparisons  Subtest Comparison Score 1 Score 2 Difference Critical Value .05 Significant Difference Y/N Base Rate  Digit Span - Arithmetic 12 13 -1 2.57 N 42.20  Symbol Search - Coding 10 12 -2 3.41 N 25.70  Statistical significance (critical value) at the .05 level.  DETERMINING STRENGTHS AND WEAKNESSES   Differences Between Subtest and Overall Mean of Subtest Scores  Subtest Subtest Scaled Score Mean Scaled Score Difference Critical Value .05 Strength or Weakness Base Rate  Block Design 9 12.90 -3.90 2.85 W 5-10%    Similarities 16 12.90 3.10 2.82 S 10-15%  Digit Span 12 12.90 -0.90 2.22  >25%  Matrix Reasoning 11 12.90 -1.90 2.54  >25%  Vocabulary 18 12.90 5.10 2.03 S 1-2%  Arithmetic 13 12.90 0.10 2.73  >25%  Symbol Search 10 12.90 -2.90 3.42  15-25%  Visual Puzzles 15 12.90 2.10 2.71  >25%  Information 13 12.90 0.10 2.19  >25%  Coding 12 12.90 -0.90 2.97  >25%  Overall: Mean = 12.90, Scatter =  9, Base rate =  16.7 Base Rate for Intersubtest  Scatter is reported for 10 Subtests. Statistical significance (critical value) at the .05 level.   PROCESS ANALYSIS   Perceptual Reasoning Process Score Summary  Process Score Raw Score Scaled Score Percentile Rank SEM  Block Design No Time Bonus 32 9 37 1.08    Working Memory Process Score Summary  Process Score Raw Score Scaled Score Percentile Rank Base Rate SEM  Digit Span Forward 12 12 75 -- 1.24  Digit Span Backward 11 13 84 -- 1.12  Digit Span Sequencing 9 11 63 -- 1.27  Longest Digit Span Forward 9 -- -- 14.0 --  Longest Digit Span Backward 7 -- -- 16.5 --  Longest Digit Span Sequence 7 -- -- 23.0 --  Longest Letter-Number Sequence 6 -- -- 46.0 --    Process Level Discrepancy Comparisons  Process Comparison Score 1 Score 2 Difference Critical Value .05 Significant Difference Y/N Base Rate  Block Design - Block Design No Time Bonus 9 9 0 3.08 N   Digit Span Forward - Digit Span Backward 12 13 -1 3.65 N 46.8  Digit Span Forward - Digit Span Sequencing 12 11 1  3.60 N 43.2  Digit Span Backward - Digit Span Sequencing 13 11 2  3.56 N 29.9  Longest Digit Span Forward - Longest Digit Span Backward 9 7 2  -- -- 61.0  Longest Digit Span Forward - Longest Digit Span Sequence 9 7 2  -- -- 39.5  Longest Digit Span Backward - Longest Digit Span Sequence 7 7 0 -- --   Statistical significance (critical value) at the .05 level.   Raw Scores  Subtest Score Range Raw Score  Process Score Range Raw Score  Block Design 0-66 35  Block  Design No Time Bonus 0-48 32  Similarities 0-36 34  Digit Span Forward 0-16 12  Digit Span 0-48 32  Digit Span Backward 0-16 11  Matrix Reasoning 0-26 19  Digit Span Sequencing 0-16 9  Vocabulary 0-57 56  Longest Digit Span Forward 0, 2-9 9  Arithmetic 0-22 18  Longest Digit Span Backward 0, 2-8 7  Symbol Search 0-60 30  Longest Digit Span Sequence 0, 2-9 7  Visual Puzzles 0-26 20  Longest Letter-Number Seq. 0, 2-8 6  Information 0-26 20      Coding 0-135 73      Letter-Number Seq. 0-30 22      Figure Weights 0-27 20      Comprehension 0-36 36      Cancellation 0-72 39      Picture Completion 0-24 19          WMS-IV Wechsler Memory Scale-Fourth Edition Score Report   Examinee Name Bruna PotterMary Aubert  Date of Report 2017/06/04  Jerrol Bananaxaminee ID [id number]  Years of Education 16  Date of Birth September 25, 1960  Home Language   Gender Female  Handedness Not Specified  Race/Ethnicity   Examiner Name Arley PhenixJohn Tinika Bucknam  Date of Testing 2017/06/01  Age at Testing 56 years 3 months  Retest? No   Comments: Brief Cognitive Status Exam Classification  Age Years of Education Raw Score Classification Level Base Rate  56 years 3 months 16 54 Average 100.0    Index Score Summary  Index Sum of Scaled Scores Index Score Percentile Rank 95% Confidence Interval Qualitative Descriptor  Auditory Memory (AMI) 56 124 95 117-129 Superior  Visual Memory (VMI) 37 95 37 90-101 Average  Visual Working Memory (VWMI) 20 100 50 93-107 Average  Immediate Memory (IMI) 46 109 73 102-115 Average  Delayed Memory (DMI)  47 112 79 105-118 High Average   Index Score Profile   Primary Subtest Scaled Score Summary  Subtest Domain Raw Score Scaled Score Percentile Rank  Logical Memory I AM 35 14 91  Logical Memory II AM 34 15 95  Verbal Paired Associates I AM 42 13 84  Verbal Paired Associates II AM 13 14 91  Designs I VM 73 11 63  Designs II VM 49 9 37  Visual Reproduction I VM 32 8 25  Visual Reproduction II VM 22  9 37  Spatial Addition VWM 15 12 75  Symbol Span VWM 17 8 25      PROCESS SCORE CONVERSIONS  Auditory Memory Process Score Summary  Process Score Raw Score Scaled Score Percentile Rank Cumulative Percentage (Base Rate)  LM II Recognition 26 - - >75%  VPA II Recognition 40 - - >75%   Visual Memory Process Score Summary  Process Score Raw Score Scaled Score Percentile Rank Cumulative Percentage (Base Rate)  DE I Content 40 13 84 -  DE I Spatial 15 9 37 -  DE II Content 37 12 75 -  DE II Spatial 10 8 25  -  DE II Recognition 16 - - 51-75%  VR II Recognition 6 - - 51-75%    SUBTEST-LEVEL DIFFERENCES WITHIN INDEXES  Auditory Memory Index  Subtest Scaled Score AMI Mean Score Difference from Mean Critical Value Base Rate  Logical Memory I 14 14.00 0.00 2.64 >25%  Logical Memory II 15 14.00 1.00 2.48 >25%  Verbal Paired Associates I 13 14.00 -1.00 1.90 >25%  Verbal Paired Associates II 14 14.00 0.00 2.48 >25%  Statistical significance (critical value) at the .05 level.   Visual Memory Index  Subtest Scaled Score VMI Mean Score Difference from Mean Critical Value Base Rate  Designs I 11 9.25 1.75 2.38 >25%  Designs II 9 9.25 -0.25 2.38 >25%  Visual Reproduction I 8 9.25 -1.25 1.86 >25%  Visual Reproduction II 9 9.25 -0.25 1.48 >25%  Statistical significance (critical value) at the .05 level.   Immediate Memory Index  Subtest Scaled Score IMI Mean Score Difference from Mean Critical Value Base Rate  Logical Memory I 14 11.50 2.50 2.59 25%  Verbal Paired Associates I 13 11.50 1.50 1.82 >25%  Designs I 11 11.50 -0.50 2.42 >25%  Visual Reproduction I 8 11.50 -3.50 1.91 10%  Statistical significance (critical value) at the .05 level.   Delayed Memory Index  Subtest Scaled Score DMI Mean Score Difference from Mean Critical Value Base Rate  Logical Memory II 15 11.75 3.25 2.44 15%  Verbal Paired Associates II 14 11.75 2.25 2.44 25%  Designs II 9 11.75 -2.75 2.44 15-25%    Visual Reproduction II 9 11.75 -2.75 1.57 15-25%  Statistical significance (critical value) at the .05 level.    SUBTEST DISCREPANCY COMPARISON  Comparison Score 1 Score 2 Difference Critical Value Base Rate  Spatial Addition - Symbol Span 12 8 4  2.74 25.9  Statistical significance (critical value) at the .05 level.    SUBTEST-LEVEL CONTRAST SCALED SCORES  Logical Memory  Score Score 1 Score 2 Contrast Scaled Score  LM II Recognition vs. Delayed Recall >75% 15 14  LM Immediate Recall vs. Delayed Recall 14 15 12    Verbal Paired Associates  Score Score 1 Score 2 Contrast Scaled Score  VPA II Recognition vs. Delayed Recall >75% 14 14  VPA Immediate Recall vs. Delayed Recall 13 14 12    Designs  Score Score 1 Score 2 Contrast  Scaled Score  DE I Spatial vs. Content 9 13 14   DE II Spatial vs. Content 8 12 13   DE II Recognition vs. Delayed Recall 51-75% 9 8  DE Immediate Recall vs. Delayed Recall 11 9 8    Visual Reproduction  Score Score 1 Score 2 Contrast Scaled Score  VR II Recognition vs. Delayed Recall 51-75% 9 8  VR Immediate Recall vs. Delayed Recall 8 9 10     INDEX-LEVEL CONTRAST SCALED SCORES  WMS-IV Indexes  Score Score 1 Score 2 Contrast Scaled Score  Auditory Memory Index vs. Visual Memory Index 124 95 8  Visual Working Memory Index vs. Visual Memory Index 100 95 8  Immediate Memory Index vs. Delayed Memory Index 109 112 12

## 2017-06-07 ENCOUNTER — Other Ambulatory Visit: Payer: Self-pay

## 2017-06-07 ENCOUNTER — Encounter: Payer: PRIVATE HEALTH INSURANCE | Attending: Physical Medicine & Rehabilitation

## 2017-06-07 ENCOUNTER — Encounter: Payer: Self-pay | Admitting: Physical Medicine & Rehabilitation

## 2017-06-07 ENCOUNTER — Ambulatory Visit (HOSPITAL_BASED_OUTPATIENT_CLINIC_OR_DEPARTMENT_OTHER): Payer: PRIVATE HEALTH INSURANCE | Admitting: Physical Medicine & Rehabilitation

## 2017-06-07 VITALS — BP 129/87 | HR 90

## 2017-06-07 DIAGNOSIS — M4802 Spinal stenosis, cervical region: Secondary | ICD-10-CM | POA: Diagnosis not present

## 2017-06-07 DIAGNOSIS — M47896 Other spondylosis, lumbar region: Secondary | ICD-10-CM | POA: Insufficient documentation

## 2017-06-07 DIAGNOSIS — M797 Fibromyalgia: Secondary | ICD-10-CM | POA: Insufficient documentation

## 2017-06-07 DIAGNOSIS — F329 Major depressive disorder, single episode, unspecified: Secondary | ICD-10-CM | POA: Insufficient documentation

## 2017-06-07 DIAGNOSIS — M1288 Other specific arthropathies, not elsewhere classified, other specified site: Secondary | ICD-10-CM | POA: Insufficient documentation

## 2017-06-07 DIAGNOSIS — I10 Essential (primary) hypertension: Secondary | ICD-10-CM | POA: Diagnosis not present

## 2017-06-07 DIAGNOSIS — Z7901 Long term (current) use of anticoagulants: Secondary | ICD-10-CM | POA: Insufficient documentation

## 2017-06-07 DIAGNOSIS — M542 Cervicalgia: Secondary | ICD-10-CM | POA: Insufficient documentation

## 2017-06-07 DIAGNOSIS — E7212 Methylenetetrahydrofolate reductase deficiency: Secondary | ICD-10-CM | POA: Diagnosis not present

## 2017-06-07 DIAGNOSIS — Z86711 Personal history of pulmonary embolism: Secondary | ICD-10-CM | POA: Diagnosis not present

## 2017-06-07 DIAGNOSIS — M545 Low back pain: Secondary | ICD-10-CM | POA: Diagnosis not present

## 2017-06-07 DIAGNOSIS — K219 Gastro-esophageal reflux disease without esophagitis: Secondary | ICD-10-CM | POA: Insufficient documentation

## 2017-06-07 DIAGNOSIS — Z1589 Genetic susceptibility to other disease: Secondary | ICD-10-CM

## 2017-06-07 DIAGNOSIS — G8929 Other chronic pain: Secondary | ICD-10-CM | POA: Insufficient documentation

## 2017-06-07 MED ORDER — TRAMADOL HCL 50 MG PO TABS: 50.0000 mg | ORAL_TABLET | Freq: Two times a day (BID) | ORAL | 0 refills | Status: DC

## 2017-06-07 MED ORDER — PREGABALIN 50 MG PO CAPS: 50.0000 mg | ORAL_CAPSULE | Freq: Three times a day (TID) | ORAL | 1 refills | Status: DC

## 2017-06-07 NOTE — Patient Instructions (Signed)
Would slowly reintroduce yoga exercises one at a time

## 2017-06-07 NOTE — Progress Notes (Signed)
Subjective:  57 year old female with prior medical history significant for depression, possible fibromyalgia, chronic low back and neck pain who is referred by her chiropractor for the above symptoms. She has been getting spinal manipulations, but has not shown any progressive improvement. She does have exacerbations and remissions of her symptoms.  She has no shooting pain down into the arms or legs. She does have some pain in the right buttock. Which goes into her posterior thigh area. She denies any history of injury or trauma to that area.  She has recently started some physical [facility]. She finds this quite helpful, working on right lower extremity, soft tissue mobilization.  Patient had been on Cymbalta in the past for depression as well as her pain symptoms and found this to be helpful for the pain, however, over time she developed adverse cognitive side effects. The patient states that she does sometimes over time developed side effects to medications, rather than immediately after starting them.  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 Candise Che, no obvious cause for history of pulmonary embolism, given that homocysteine level was normal in the presence of MTHFR gene mutations Plans are for long-term anticoagulation, currently on apixiban    Patient ID: Faith Thomas, female    DOB: 08/27/1960, 57 y.o.   MRN: [id number]  HPI   Chief complaint is pain in the neck upper back low back buttocks and posterior thighs   Neuropsych eval showed no sig deficits- verbal report, patient seems pleased by this  Activity level was doing ok until an aggressive treatment of dry needling.  Now has backed off yoga because of excessive pain These are mainly neck ROM and  shoulder depressor  Ambulates ~31min per day  Tramadol 50mg  taking about 2 per day, since the "flare up " with relief Has been treated with corticosteroid in the past for her pain with some relief  Pain Inventory Average Pain 7 Pain Right Now 5 My pain is sharp, dull and aching  In the last 24 hours, has pain interfered with the following? General activity 8 Relation with others 8 Enjoyment of life 7 What TIME of day is your pain at its worst? . Sleep (in general) Fair  Pain is worse with: unsure Pain improves with: heat/ice and medication Relief from Meds: 7  Mobility walk without assistance ability to climb steps?  yes do you drive?  yes  Function employed # of hrs/week 40  Neuro/Psych weakness dizziness  Prior Studies Any changes since last visit?  no  Physicians involved in your care Any changes since last visit?  no   Family History  Problem Relation Age of Onset  . Arthritis Mother   . Hypertension Mother   . Mental illness Mother   . Arthritis Father   . Hypertension Father   . Mental illness Father   . Mental illness Brother   . Arthritis Daughter   . Cancer Maternal Aunt   . Hypertension Maternal Grandmother   . Arthritis Maternal Grandfather    Social History   Socioeconomic History  . Marital status: Divorced    Spouse name: Not on file  . Number of children: Not on file  . Years of education: Not on file  . Highest education level: Not on  file  Social Needs  . Financial resource strain: Not on file  . Food insecurity - worry: Not on file  . Food insecurity - inability: Not on file  . Transportation needs - medical: Not on file  . Transportation needs - non-medical: Not on file  Occupational History  . Not on file  Tobacco Use  . Smoking status: Never Smoker  . Smokeless tobacco: Never Used  Substance and Sexual Activity  . Alcohol use: Yes    Alcohol/week: 0.0 oz    Comment: occasional   . Drug use: No  . Sexual activity: Not  on file  Other Topics Concern  . 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 Past Medical History:  Diagnosis Date  . Allergy   . Depression   . DVT (deep venous thrombosis) (HCC)   . Fibromyalgia   . Genetic defect   . GERD (gastroesophageal reflux disease)   . Hypertension   . PE (pulmonary thromboembolism) (HCC)   . Positive TB test   . Urine incontinence    There were no vitals taken for this visit.  Opioid Risk Score:   Fall Risk Score:  `1  Depression screen PHQ 2/9  No flowsheet data found.   Review of Systems  Constitutional: Negative.   HENT: Negative.   Eyes: Negative.   Respiratory: Negative.   Cardiovascular: Negative.   Gastrointestinal: Negative.   Endocrine: Negative.   Genitourinary: Negative.   Musculoskeletal: Negative.   Skin: Negative.   Allergic/Immunologic: Negative.   Neurological: Negative.   Hematological: Negative.   Psychiatric/Behavioral: Negative.   All other systems reviewed and are negative.      Objective:   Physical Exam  Constitutional: She is oriented to person, place, and time. She appears well-developed and well-nourished.  HENT:  Head: Normocephalic and atraumatic.  Eyes: Conjunctivae and EOM are normal. Pupils are equal, round, and reactive to light.  Neck: Normal range of motion.  Musculoskeletal:  Tenderness palpation bilateral cervical paraspinals trapezius thoracic and lumbar paraspinals gluteus medius greater trochanter of the hip sternal costal as well as posterior thigh areas. Palpated areas did not show any sign of increased tone  Neurological: She is alert and oriented to person, place, and time.  Motor strength is 5/5 bilateral deltoid bicep tricep grip  hip flexion knee extension ankle dorsiflexion   Psychiatric: She has a normal mood and affect.  Nursing note and vitals reviewed.         Assessment & Plan:  #1.  Fibromyalgia syndrome with a flareup.  We discussed medication management.  Will increase Lyrica to 50 mg 3 times daily and may have to go higher if there are no side effects from the medication. We discussed that the needling itself could potentially cause some localized muscle pain but not in areas that are distant from the area of needle insertion.  We discussed that her fibromyalgia his her primary pain issue and is responsible for the widespread body pain.  It is not a muscular disease but rather a pain processing problem therefore will increase the Lyrica. We discussed the importance of exercise she does continue her walking.  I would recommend that she resumes  her yoga starting with 1 exercise at a time.  Physical activity is the primary treatment for this problem  2. MTHFR gene mutations with normal homocysteine levels.  Hx of PE on Apixaban. Do not think that her chronic pain is a result of this issue Over half of the 25 min visit was spent counseling and coordinating care.

## 2017-06-14 ENCOUNTER — Encounter: Payer: Self-pay | Admitting: Family Medicine

## 2017-06-25 ENCOUNTER — Ambulatory Visit: Payer: PRIVATE HEALTH INSURANCE | Admitting: Physical Medicine & Rehabilitation

## 2017-07-04 ENCOUNTER — Ambulatory Visit: Payer: PRIVATE HEALTH INSURANCE | Admitting: Pulmonary Disease

## 2017-07-05 ENCOUNTER — Other Ambulatory Visit: Payer: Self-pay | Admitting: Physical Medicine & Rehabilitation

## 2017-07-10 ENCOUNTER — Encounter: Payer: Self-pay | Admitting: Physical Medicine & Rehabilitation

## 2017-07-10 ENCOUNTER — Ambulatory Visit (HOSPITAL_BASED_OUTPATIENT_CLINIC_OR_DEPARTMENT_OTHER): Payer: PRIVATE HEALTH INSURANCE | Admitting: Physical Medicine & Rehabilitation

## 2017-07-10 VITALS — BP 116/83 | HR 86

## 2017-07-10 DIAGNOSIS — Z7901 Long term (current) use of anticoagulants: Secondary | ICD-10-CM | POA: Insufficient documentation

## 2017-07-10 DIAGNOSIS — K219 Gastro-esophageal reflux disease without esophagitis: Secondary | ICD-10-CM | POA: Insufficient documentation

## 2017-07-10 DIAGNOSIS — I10 Essential (primary) hypertension: Secondary | ICD-10-CM | POA: Insufficient documentation

## 2017-07-10 DIAGNOSIS — M542 Cervicalgia: Secondary | ICD-10-CM | POA: Insufficient documentation

## 2017-07-10 DIAGNOSIS — G8929 Other chronic pain: Secondary | ICD-10-CM | POA: Diagnosis not present

## 2017-07-10 DIAGNOSIS — M545 Low back pain: Secondary | ICD-10-CM | POA: Insufficient documentation

## 2017-07-10 DIAGNOSIS — M4802 Spinal stenosis, cervical region: Secondary | ICD-10-CM | POA: Diagnosis not present

## 2017-07-10 DIAGNOSIS — Z86711 Personal history of pulmonary embolism: Secondary | ICD-10-CM | POA: Diagnosis not present

## 2017-07-10 DIAGNOSIS — M797 Fibromyalgia: Secondary | ICD-10-CM

## 2017-07-10 DIAGNOSIS — M1288 Other specific arthropathies, not elsewhere classified, other specified site: Secondary | ICD-10-CM | POA: Insufficient documentation

## 2017-07-10 DIAGNOSIS — M47896 Other spondylosis, lumbar region: Secondary | ICD-10-CM | POA: Insufficient documentation

## 2017-07-10 DIAGNOSIS — F329 Major depressive disorder, single episode, unspecified: Secondary | ICD-10-CM | POA: Diagnosis not present

## 2017-07-10 MED ORDER — PREGABALIN 50 MG PO CAPS: 50.0000 mg | ORAL_CAPSULE | Freq: Two times a day (BID) | ORAL | 5 refills | Status: DC

## 2017-07-10 NOTE — Patient Instructions (Signed)
Please keep exercising

## 2017-07-10 NOTE — Progress Notes (Signed)
Subjective:    Patient ID: Faith Thomas, female    DOB: 1960-12-30, 57 y.o.   MRN: [id number]  HPI Fibromyalgia pain improved starting about 1 wk ago  Walking ~41min per day at work Doing yoga ~2-3 days per wk Sees [place] at [employer] tx Taking tramadol prn Takes Lyrica 50mg  BID  No other new medical issues. Pain Inventory Average Pain 4 Pain Right Now 4 My pain is constant, dull and aching  In the last 24 hours, has pain interfered with the following? General activity 8 Relation with others 8 Enjoyment of life 8 What TIME of day is your pain at its worst? varies Sleep (in general) Fair  Pain is worse with: standing and some activites Pain improves with: rest, heat/ice, therapy/exercise and medication Relief from Meds: 7  Mobility walk without assistance ability to climb steps?  yes do you drive?  yes  Function employed # of hrs/week 40  Neuro/Psych weakness spasms depression anxiety  Prior Studies Any changes since last visit?  no  Physicians involved in your care Any changes since last visit?  no   Family History  Problem Relation Age of Onset  . Arthritis Mother   . Hypertension Mother   . Mental illness Mother   . Arthritis Father   . Hypertension Father   . Mental illness Father   . Mental illness Brother   . Arthritis Daughter   . Cancer Maternal Aunt   . Hypertension Maternal Grandmother   . Arthritis Maternal Grandfather    Social History   Socioeconomic History  . Marital status: Divorced    Spouse name: None  . Number of children: None  . Years of education: None  . Highest education level: None  Social Needs  . Financial resource strain: None  . Food insecurity - worry: None  . Food insecurity - inability: None  . Transportation needs - medical: None  . Transportation needs - non-medical: None  Occupational History  . None  Tobacco Use  . Smoking status: Never Smoker  . Smokeless tobacco: Never Used    Substance and Sexual Activity  . Alcohol use: Yes    Alcohol/week: 0.0 oz    Comment: occasional   . Drug use: No  . Sexual activity: None  Other Topics Concern  . 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 Past Medical History:  Diagnosis Date  . Allergy   . Depression   . DVT (deep venous thrombosis) (HCC)   . Fibromyalgia   . Genetic defect   . GERD (gastroesophageal reflux disease)   . Hypertension   . PE (pulmonary thromboembolism) (HCC)   . Positive TB test   . Urine incontinence    BP 116/83   Pulse 86   SpO2 97%   Opioid Risk Score:   Fall Risk Score:  `1  Depression screen PHQ 2/9  No flowsheet data found.   Review of Systems  Constitutional: Positive for unexpected weight change.  HENT: Negative.   Eyes: Negative.   Respiratory: Negative.   Cardiovascular: Negative.   Gastrointestinal: Positive for abdominal pain, constipation and diarrhea.  Endocrine: Negative.   Genitourinary: Negative.   Musculoskeletal: Negative.  Skin: Positive for rash.  Allergic/Immunologic: Negative.   Neurological: Negative.   Hematological: Negative.   Psychiatric/Behavioral: Negative.   All other systems reviewed and are negative.      Objective:   Physical Exam  There is no tenderness palpation in the upper trapezius area No tenderness in the upper back lower back or hip area. Patient has full range of motion lumbar and cervical and thoracic spine. She ambulates without assistive device no evidence of toe drag or knee instability Motor strength is 5/5 in bilateral hip flexor knee extensor ankle dorsiflexor deltoid bicep tricep and grip      Assessment & Plan:  1.  Fibromyalgia syndrome her primary pain  generator. She is getting good relief with Lyrica 50 mg twice daily Her use of tramadol is now as needed.  She has a full bottle left. Recommend continue her current exercise program with yoga and walking I will see her back in 6 months.  She is to call if she has an exacerbation of pain.  If she remains on tramadol on a chronic basis she will need controlled substance agreement as well as urine drug screen She will follow-up with neuropsychology Dr. Kieth Brightlyodenbough to follow-up on cognitive testing  #2.  MT HFR gene mutation, patient is wondering whether this may be the cause of her musculoskeletal issues.  Review on the issue using Up-to-Date does not identify any musculoskeletal issues.  She states the NIH website has some additional information on this.

## 2017-07-13 ENCOUNTER — Encounter: Payer: PRIVATE HEALTH INSURANCE | Attending: Physical Medicine & Rehabilitation | Admitting: Psychology

## 2017-07-13 DIAGNOSIS — E7212 Methylenetetrahydrofolate reductase deficiency: Secondary | ICD-10-CM | POA: Diagnosis not present

## 2017-07-13 DIAGNOSIS — R413 Other amnesia: Secondary | ICD-10-CM

## 2017-07-13 DIAGNOSIS — M797 Fibromyalgia: Secondary | ICD-10-CM

## 2017-07-13 DIAGNOSIS — Z1589 Genetic susceptibility to other disease: Secondary | ICD-10-CM

## 2017-07-13 DIAGNOSIS — G8929 Other chronic pain: Secondary | ICD-10-CM | POA: Diagnosis not present

## 2017-07-18 ENCOUNTER — Other Ambulatory Visit: Payer: Self-pay | Admitting: Family Medicine

## 2017-07-23 ENCOUNTER — Ambulatory Visit: Payer: PRIVATE HEALTH INSURANCE | Admitting: Pulmonary Disease

## 2017-07-31 ENCOUNTER — Encounter: Payer: Self-pay | Admitting: Psychology

## 2017-07-31 NOTE — Progress Notes (Signed)
Today I provided feedback regarding the results of the current neuropsychological evaluation.  This feedback was 1 on 1 interpretation with clinical decision and treatment planning and interactive feedback with the patient.  Below is the summary from the evaluation.  Summary of Results:                        The results of the current neuropsychological assessment do suggest some specific areas of neuropsychological deficits and cognitive decline.  The primary areas has to do with issues related to visual memory and visual working memory.  While these performances are generally in the average range they are significantly below (nearly 25 points below predicted levels) predicted levels for premorbid functioning.  The patient's verbal comprehension index score suggest prior functioning in the superior range.  The patient's auditory memory and learning also falls in the superior range of functioning.  However, the patient is having difficulties with regard to overall speed of mental operations, visual spatial and perceptual reasoning, and visual memory and learning.  This pattern suggest that the primary areas of neurological changes are in the right hemisphere in general.  Impression/Diagnosis:                     The results of the current neuropsychological evaluation are consistent with specific neuropsychological deficits relative to premorbid functioning.  The patient is producing performances that are in the superior range with regard to certain aspects of cognitive functioning.  These include verbal comprehension and verbal reasoning abilities as well as her auditory encoding abilities.  The patient's overall speed of mental operations and visual spatial and visual perceptual abilities are significantly below predicted levels.  The patient is showing primary memory deficits related to visual memory and learning.  The patient displays superior auditory learning and memory.  Overall, there are specific  neuropsychological deficits suggested in the current assessment.  They do appear to be primarily related to changes in right hemispheric functioning and are consistent with the patient's descriptions of ongoing cognitive difficulties.  The pattern of strengths and weaknesses are not consistent with those typically seen as deficits related to depression or other psychological/psychiatric issues.  The memory deficits may very well be related to her MTF-HR gene mutation.  The effects this mutation has on processing certain B vitamins can have a significant negative impact on the neurological functioning related to memory and learning.  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 Neuropsychologist

## 2017-08-05 ENCOUNTER — Other Ambulatory Visit: Payer: Self-pay | Admitting: Physical Medicine & Rehabilitation

## 2017-08-06 ENCOUNTER — Other Ambulatory Visit: Payer: Self-pay | Admitting: Family Medicine

## 2017-08-18 ENCOUNTER — Other Ambulatory Visit: Payer: Self-pay | Admitting: Family Medicine

## 2017-08-31 ENCOUNTER — Other Ambulatory Visit: Payer: Self-pay | Admitting: Family Medicine

## 2017-09-05 ENCOUNTER — Encounter: Payer: Self-pay | Admitting: Family Medicine

## 2017-09-05 DIAGNOSIS — F3340 Major depressive disorder, recurrent, in remission, unspecified: Secondary | ICD-10-CM | POA: Insufficient documentation

## 2017-09-05 DIAGNOSIS — K219 Gastro-esophageal reflux disease without esophagitis: Secondary | ICD-10-CM | POA: Insufficient documentation

## 2017-09-05 DIAGNOSIS — Z86711 Personal history of pulmonary embolism: Secondary | ICD-10-CM | POA: Insufficient documentation

## 2017-09-05 DIAGNOSIS — J302 Other seasonal allergic rhinitis: Secondary | ICD-10-CM | POA: Insufficient documentation

## 2017-09-05 NOTE — Progress Notes (Signed)
HPI:  Using dictation device. Unfortunately this device frequently misinterprets words/phrases.  Here for CPE: Due for lipids, hgba1c, pap today, hep c - agrees to do with labs today, Tetanus - she believes she is utd on this with prior PCP -Concerns and/or follow up today:   Chronic medical problems summarized below were reviewed for changes. Overall seems to be feeling better.  Reported hx vit D deficiency. Taking vit D3.  GERD: -meds: ranitidine  HTN: -meds: lisinopril  Fibromyalgia: -seeing Dr. 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 Collene Mares - 10 yr Breast Ca Risk Assessment: see family history and pt history DEXA (>/= 32): n/a  -Alcohol, Tobacco, drug use: see social history  Sees Dr. Kathlen Mody - [facility] Dr. Sherley Bounds - [facility] rheumatology [facility] PMR - Dr. Letta Pate Hematology - Dr. Irene Limbo Neuropsych - Dr. Sima Matas   Review of Systems - over all reports doing better. Still a little low energy. Mood better despite family stress. no report of fevers, unintentional weight loss, vision loss, hearing loss, chest pain, sob, hemoptysis, melena, hematochezia, hematuria, genital  discharge, loc, thoughts of self harm or SI  Past Medical History:  Diagnosis Date  . Allergy   . Depression   . DVT (deep venous thrombosis) ([place])   . Fibromyalgia   . Genetic defect   . GERD (gastroesophageal reflux disease)   . 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 Hypertension   . PE (pulmonary thromboembolism) ([place])   . Positive TB test   . Urine incontinence     History reviewed. No pertinent surgical history.  Family History  Problem Relation Age of Onset  . Arthritis Mother   . Hypertension Mother   . Mental illness Mother   . Arthritis Father   . Hypertension Father   . Mental illness Father   . Mental illness Brother   . Arthritis Daughter   . Cancer Maternal Aunt   . Hypertension Maternal Grandmother   . Arthritis Maternal Grandfather     Social History   Socioeconomic History  . Marital status: Divorced    Spouse name: Not on file  . Number of children: Not on file  . Years of education: Not on file  . Highest education level: Not on file  Occupational History  . Not on file  Social Needs  . Financial resource strain: Not on file  . Food insecurity:    Worry: Not on file    Inability: Not on file  . Transportation needs:    Medical: Not on file    Non-medical: Not on file  Tobacco Use  . Smoking status: Never Smoker  . Smokeless tobacco: Never Used  Substance and Sexual Activity  . Alcohol use: Yes    Alcohol/week: 0.0 oz    Comment: occasional   .  Drug use: No  . Sexual activity: Not on file  Lifestyle  . Physical activity:    Days per week: Not on file    Minutes per session: Not on file  . Stress: Not on file  Relationships  . Social connections:    Talks on phone: Not on file    Gets together: Not on file    Attends religious service: Not on file    Active member of club or organization: Not on file    Attends meetings of clubs or organizations: Not on file    Relationship status: Not on file  Other Topics  Concern  . Not on file  Social History Narrative     Current Outpatient Medications:  .  acetaminophen (TYLENOL) 500 MG tablet, Take 1,000 mg by mouth every 8 (eight) hours as needed. , Disp: , Rfl:  .  ALPRAZolam (XANAX) 0.5 MG tablet, Take 0.5 mg by mouth at bedtime as needed for anxiety. , Disp: , Rfl:  .  apixaban (ELIQUIS) 5 MG TABS tablet, Take 1 tablet (5 mg total) by mouth 2 (two) times daily with a meal., Disp: 180 tablet, Rfl: 1 .  b complex vitamins capsule, Take 1 capsule by mouth daily., Disp: , Rfl:  .  busPIRone (BUSPAR) 30 MG tablet, Take 30 mg by mouth 2 (two) times daily., Disp: , Rfl:  .  Cholecalciferol (VITAMIN D-3) 1000 units CAPS, Take 10,000 Units by mouth daily. , Disp: , Rfl:  .  cyclobenzaprine (FLEXERIL) 5 MG tablet, TAKE 1 TABLET BY MOUTH TWICE A DAY AS NEEDED FOR MUSCLE SPASMS, Disp: 30 tablet, Rfl: 0 .  L-Methylfolate-Algae (DEPLIN 15 PO), Take by mouth., Disp: , Rfl:  .  lisinopril (PRINIVIL,ZESTRIL) 10 MG tablet, Take 1 tablet (10 mg total) by mouth daily., Disp: 90 tablet, Rfl: 3 .  LYRICA 50 MG capsule, TAKE ONE CAPSULE BY MOUTH 3 TIMES A DAY, Disp: 90 capsule, Rfl: 2 .  mometasone (NASONEX) 50 MCG/ACT nasal spray, Place 2 sprays into the nose as needed., Disp: 51 g, Rfl: 3 .  Omega 3 1200 MG CAPS, Take 1,200 mg by mouth daily., Disp: , Rfl:  .  Probiotic Product (PROBIOTIC PO), Take 1 capsule by mouth daily. , Disp: , Rfl:  .  sertraline (ZOLOFT) 100 MG tablet, Take 200 mg by mouth at bedtime. , Disp: , Rfl:  .  SILENOR 6 MG TABS, Take 1 tablet by mouth at bedtime., Disp: , Rfl: 4 .  traMADol (ULTRAM) 50 MG tablet, Take 1 tablet (50 mg total) by mouth 2 (two) times daily., Disp: 60 tablet, Rfl: 0 .  vitamin C (ASCORBIC ACID) 500 MG tablet, Take 1,000 mg by mouth daily. , Disp: , Rfl:   EXAM:  Vitals:   09/06/17 0707  BP: 98/62  Pulse: 79  Temp: 98.4 F (36.9 C)    GENERAL: vitals reviewed and listed below, alert, oriented, appears well hydrated  and in no acute distress  HEENT: head atraumatic, PERRLA, normal appearance of eyes, ears, nose and mouth. moist mucus membranes.  NECK: supple, no masses or lymphadenopathy  LUNGS: clear to auscultation bilaterally, no rales, rhonchi or wheeze  CV: HRRR, no peripheral edema or cyanosis, normal pedal pulses  ABDOMEN: bowel sounds normal, soft, non tender to palpation, no masses, no rebound or guarding  BREAST: normal appearance - no skin lesions or discharge noted on inspection of both breasts, on palpation of both breast and axillary region no suspicious lesions appreciated today  GU: normal appearance of external genitalia - no lesions or masses appreciated, normal appearing vaginal mucosa - no abnormal discharge, normal appearance of cervix - no lesions or abnormal discharge observed  SKIN: no rash or abnormal lesions  MS: normal gait, moves all extremities normally  NEURO: normal gait, speech and thought processing grossly intact, muscle tone grossly intact throughout  PSYCH: normal affect, pleasant and cooperative  ASSESSMENT AND PLAN:  1. Encounter for preventative adult health care examination -Discussed and advised all [place] preventive services health task force level A and B recommendations for age, sex and risks. -labs, studies and vaccines per orders this encounter -pap today - Lipid panel - Hemoglobin A1c  2. Hx pulmonary embolism -on chronic OAC - Basic metabolic panel - CBC  3. MDD (recurrent major depressive disorder) -sees psychiatry for management -mood good despite recent stressors -see PHQ9  4. Gastroesophageal reflux disease, esophagitis presence not specified -stable  5. Vitamin D deficiency - VITAMIN D 25 Hydroxy (Vit-D Deficiency, Fractures)  6. Need for hepatitis C screening test - Hepatitis C antibody  9. Cervical cancer screening -pap obtained    Patient advised to return to clinic immediately if symptoms worsen or persist or new  concerns.  Patient Instructions  BEFORE YOU LEAVE: -labs -Wendie Simmer, can you check with her prior PCP on date of last tetanus then let her know - you can do this after visit -follow up: 4-6 months  We have ordered labs and a pap smear at this visit. It can take up to 1-2 weeks for results and processing. IF results require follow up or explanation, we will call you with instructions. Clinically stable results will be released to your [facility]. If you have not heard from [place] or cannot find your results in [facility] in 2 weeks please contact our office at [phone].  If you are not yet signed up for [facility], please consider signing up.   Preventive Care 40-64 Years, Female Preventive care refers to lifestyle choices and visits with your health care provider that can promote health and wellness. What does preventive care include?  A yearly physical exam. This is also called an annual well check.  Dental exams once or twice a year.  Routine eye exams. Ask your health care provider how often you should have your eyes checked.  Personal lifestyle choices, including: ? Daily care of your teeth and gums. ? Regular physical activity. ? Eating a healthy diet. ? Avoiding tobacco and drug use. ? Limiting alcohol use. ? Practicing safe sex. ? Taking low-dose aspirin daily starting at age 30. ? Taking vitamin and mineral supplements as recommended by your health care provider. What happens during an annual well check? The services and screenings done by your health care provider during your annual well check will depend on your age, overall health, lifestyle risk factors, and family history of disease. Counseling Your health care provider may ask you questions about your:  Alcohol use.  Tobacco use.  Drug use.  Emotional well-being.  Home and relationship well-being.  Sexual activity.  Eating habits.  Work and work Statistician.  Method of birth control.  Menstrual  cycle.  Pregnancy history.  Screening You may have the following tests or measurements:  Height, weight, and BMI.  Blood pressure.  Lipid and cholesterol levels. These may be checked every 5 years, or more frequently if you are over 53 years old.  Skin check.  Lung cancer screening. You may have this screening every year starting at  age 94 if you have a 30-pack-year history of smoking and currently smoke or have quit within the past 15 years.  Fecal occult blood test (FOBT) of the stool. You may have this test every year starting at age 8.  Flexible sigmoidoscopy or colonoscopy. You may have a sigmoidoscopy every 5 years or a colonoscopy every 10 years starting at age 2.  Hepatitis C blood test.  Hepatitis B blood test.  Sexually transmitted disease (STD) testing.  Diabetes screening. This is done by checking your blood sugar (glucose) after you have not eaten for a while (fasting). You may have this done every 1-3 years.  Mammogram. This may be done every 1-2 years. Talk to your health care provider about when you should start having regular mammograms. This may depend on whether you have a family history of breast cancer.  BRCA-related cancer screening. This may be done if you have a family history of breast, ovarian, tubal, or peritoneal cancers.  Pelvic exam and Pap test. This may be done every 3 years starting at age 41. Starting at age 30, this may be done every 5 years if you have a Pap test in combination with an HPV test.  Bone density scan. This is done to screen for osteoporosis. You may have this scan if you are at high risk for osteoporosis.  Discuss your test results, treatment options, and if necessary, the need for more tests with your health care provider. Vaccines Your health care provider may recommend certain vaccines, such as:  Influenza vaccine. This is recommended every year.  Tetanus, diphtheria, and acellular pertussis (Tdap, Td) vaccine. You may  need a Td booster every 10 years.  Varicella vaccine. You may need this if you have not been vaccinated.  Zoster vaccine. You may need this after age 62.  Measles, mumps, and rubella (MMR) vaccine. You may need at least one dose of MMR if you were born in 1957 or later. You may also need a second dose.  Pneumococcal 13-valent conjugate (PCV13) vaccine. You may need this if you have certain conditions and were not previously vaccinated.  Pneumococcal polysaccharide (PPSV23) vaccine. You may need one or two doses if you smoke cigarettes or if you have certain conditions.  Meningococcal vaccine. You may need this if you have certain conditions.  Hepatitis A vaccine. You may need this if you have certain conditions or if you travel or work in places where you may be exposed to hepatitis A.  Hepatitis B vaccine. You may need this if you have certain conditions or if you travel or work in places where you may be exposed to hepatitis B.  Haemophilus influenzae type b (Hib) vaccine. You may need this if you have certain conditions.  Talk to your health care provider about which screenings and vaccines you need and how often you need them. This information is not intended to replace advice given to you by your health care provider. Make sure you discuss any questions you have with your health care provider. Document Released: 06/18/2015 Document Revised: 02/09/2016 Document Reviewed: 03/23/2015 Elsevier Interactive Patient Education  2018 [employer].          No follow-ups on file.  Lucretia Kern, DO

## 2017-09-06 ENCOUNTER — Ambulatory Visit (INDEPENDENT_AMBULATORY_CARE_PROVIDER_SITE_OTHER): Payer: PRIVATE HEALTH INSURANCE | Admitting: Family Medicine

## 2017-09-06 ENCOUNTER — Encounter: Payer: Self-pay | Admitting: Family Medicine

## 2017-09-06 ENCOUNTER — Other Ambulatory Visit (HOSPITAL_COMMUNITY)
Admission: RE | Admit: 2017-09-06 | Discharge: 2017-09-06 | Disposition: A | Discharge status: Home / Self Care | Payer: PRIVATE HEALTH INSURANCE | Source: Ambulatory Visit | Attending: Family Medicine | Admitting: Family Medicine

## 2017-09-06 VITALS — BP 98/62 | HR 79 | Temp 98.4°F | Ht 65.25 in | Wt 146.1 lb

## 2017-09-06 DIAGNOSIS — E559 Vitamin D deficiency, unspecified: Secondary | ICD-10-CM

## 2017-09-06 DIAGNOSIS — Z124 Encounter for screening for malignant neoplasm of cervix: Secondary | ICD-10-CM | POA: Diagnosis not present

## 2017-09-06 DIAGNOSIS — F3340 Major depressive disorder, recurrent, in remission, unspecified: Secondary | ICD-10-CM | POA: Diagnosis not present

## 2017-09-06 DIAGNOSIS — K219 Gastro-esophageal reflux disease without esophagitis: Secondary | ICD-10-CM

## 2017-09-06 DIAGNOSIS — Z1151 Encounter for screening for human papillomavirus (HPV): Secondary | ICD-10-CM | POA: Diagnosis not present

## 2017-09-06 DIAGNOSIS — Z1331 Encounter for screening for depression: Secondary | ICD-10-CM

## 2017-09-06 DIAGNOSIS — Z0000 Encounter for general adult medical examination without abnormal findings: Secondary | ICD-10-CM | POA: Diagnosis not present

## 2017-09-06 DIAGNOSIS — Z86711 Personal history of pulmonary embolism: Secondary | ICD-10-CM | POA: Diagnosis not present

## 2017-09-06 DIAGNOSIS — J302 Other seasonal allergic rhinitis: Secondary | ICD-10-CM

## 2017-09-06 DIAGNOSIS — Z1159 Encounter for screening for other viral diseases: Secondary | ICD-10-CM

## 2017-09-06 LAB — BASIC METABOLIC PANEL
BUN: 17 mg/dL (ref 6–23)
CALCIUM: 9.7 mg/dL (ref 8.4–10.5)
CO2: 29 mEq/L (ref 19–32)
CREATININE: 0.85 mg/dL (ref 0.40–1.20)
Chloride: 100 mEq/L (ref 96–112)
GFR: 73.93 mL/min (ref >60.00)
Glucose, Bld: 81 mg/dL (ref 70–99)
Potassium: 4.5 mEq/L (ref 3.5–5.1)
Sodium: 137 mEq/L (ref 135–145)

## 2017-09-06 LAB — LIPID PANEL
Cholesterol: 222 mg/dL — ABNORMAL HIGH (ref 0–200)
HDL: 90.7 mg/dL (ref >39.00)
LDL CALC: 118 mg/dL — AB (ref 0–99)
NONHDL: 130.87
Total CHOL/HDL Ratio: 2
Triglycerides: 65 mg/dL (ref 0.0–149.0)
VLDL: 13 mg/dL (ref 0.0–40.0)

## 2017-09-06 LAB — HEMOGLOBIN A1C: HEMOGLOBIN A1C: 5.5 % (ref 4.6–6.5)

## 2017-09-06 LAB — CBC
HCT: 41.1 % (ref 36.0–46.0)
Hemoglobin: 13.9 g/dL (ref 12.0–15.0)
MCHC: 33.8 g/dL (ref 30.0–36.0)
MCV: 95.9 fl (ref 78.0–100.0)
Platelets: 287 10*3/uL (ref 150.0–400.0)
RBC: 4.29 Mil/uL (ref 3.87–5.11)
RDW: 13.4 % (ref 11.5–15.5)
WBC: 5.2 10*3/uL (ref 4.0–10.5)

## 2017-09-06 LAB — VITAMIN D 25 HYDROXY (VIT D DEFICIENCY, FRACTURES): VITD: 67.53 ng/mL (ref 30.00–100.00)

## 2017-09-06 NOTE — Patient Instructions (Signed)
BEFORE YOU LEAVE: -labs -Faith Thomas, can you check with her prior PCP on date of last tetanus then let her know - you can do this after visit -follow up: 4-6 months  We have ordered labs and a pap smear at this visit. It can take up to 1-2 weeks for results and processing. IF results require follow up or explanation, we will call you with instructions. Clinically stable results will be released to your [facility]. If you have not heard from [place] or cannot find your results in [facility] in 2 weeks please contact our office at [phone].  If you are not yet signed up for [facility], please consider signing up.   Preventive Care 40-64 Years, Female Preventive care refers to lifestyle choices and visits with your health care provider that can promote health and wellness. What does preventive care include?  A yearly physical exam. This is also called an annual well check.  Dental exams once or twice a year.  Routine eye exams. Ask your health care provider how often you should have your eyes checked.  Personal lifestyle choices, including: ? Daily care of your teeth and gums. ? Regular physical activity. ? Eating a healthy diet. ? Avoiding tobacco and drug use. ? Limiting alcohol use. ? Practicing safe sex. ? Taking low-dose aspirin daily starting at age 91. ? Taking vitamin and mineral supplements as recommended by your health care provider. What happens during an annual well check? The services and screenings done by your health care provider during your annual well check will depend on your age, overall health, lifestyle risk factors, and family history of disease. Counseling Your health care provider may ask you questions about your:  Alcohol use.  Tobacco use.  Drug use.  Emotional well-being.  Home and relationship well-being.  Sexual activity.  Eating habits.  Work and work Statistician.  Method of birth control.  Menstrual cycle.  Pregnancy history.  Screening You may  have the following tests or measurements:  Height, weight, and BMI.  Blood pressure.  Lipid and cholesterol levels. These may be checked every 5 years, or more frequently if you are over 59 years old.  Skin check.  Lung cancer screening. You may have this screening every year starting at age 74 if you have a 30-pack-year history of smoking and currently smoke or have quit within the past 15 years.  Fecal occult blood test (FOBT) of the stool. You may have this test every year starting at age 51.  Flexible sigmoidoscopy or colonoscopy. You may have a sigmoidoscopy every 5 years or a colonoscopy every 10 years starting at age 74.  Hepatitis C blood test.  Hepatitis B blood test.  Sexually transmitted disease (STD) testing.  Diabetes screening. This is done by checking your blood sugar (glucose) after you have not eaten for a while (fasting). You may have this done every 1-3 years.  Mammogram. This may be done every 1-2 years. Talk to your health care provider about when you should start having regular mammograms. This may depend on whether you have a family history of breast cancer.  BRCA-related cancer screening. This may be done if you have a family history of breast, ovarian, tubal, or peritoneal cancers.  Pelvic exam and Pap test. This may be done every 3 years starting at age 22. Starting at age 38, this may be done every 5 years if you have a Pap test in combination with an HPV test.  Bone density scan. This is done to  screen for osteoporosis. You may have this scan if you are at high risk for osteoporosis.  Discuss your test results, treatment options, and if necessary, the need for more tests with your health care provider. Vaccines Your health care provider may recommend certain vaccines, such as:  Influenza vaccine. This is recommended every year.  Tetanus, diphtheria, and acellular pertussis (Tdap, Td) vaccine. You may need a Td booster every 10 years.  Varicella  vaccine. You may need this if you have not been vaccinated.  Zoster vaccine. You may need this after age 2.  Measles, mumps, and rubella (MMR) vaccine. You may need at least one dose of MMR if you were born in 1957 or later. You may also need a second dose.  Pneumococcal 13-valent conjugate (PCV13) vaccine. You may need this if you have certain conditions and were not previously vaccinated.  Pneumococcal polysaccharide (PPSV23) vaccine. You may need one or two doses if you smoke cigarettes or if you have certain conditions.  Meningococcal vaccine. You may need this if you have certain conditions.  Hepatitis A vaccine. You may need this if you have certain conditions or if you travel or work in places where you may be exposed to hepatitis A.  Hepatitis B vaccine. You may need this if you have certain conditions or if you travel or work in places where you may be exposed to hepatitis B.  Haemophilus influenzae type b (Hib) vaccine. You may need this if you have certain conditions.  Talk to your health care provider about which screenings and vaccines you need and how often you need them. This information is not intended to replace advice given to you by your health care provider. Make sure you discuss any questions you have with your health care provider. Document Released: 06/18/2015 Document Revised: 02/09/2016 Document Reviewed: 03/23/2015 Elsevier Interactive Patient Education  [employer].

## 2017-09-07 LAB — HEPATITIS C ANTIBODY
HEP C AB: NONREACTIVE
SIGNAL TO CUT-OFF: 0.01 (ref <1.00)

## 2017-09-07 LAB — CYTOLOGY - PAP
Diagnosis: NEGATIVE
HPV (WINDOPATH): NOT DETECTED

## 2017-09-10 ENCOUNTER — Encounter: Payer: Self-pay | Admitting: Family Medicine

## 2017-09-12 ENCOUNTER — Encounter: Payer: Self-pay | Admitting: Family Medicine

## 2017-09-18 ENCOUNTER — Other Ambulatory Visit: Payer: Self-pay | Admitting: Family Medicine

## 2017-09-25 ENCOUNTER — Other Ambulatory Visit: Payer: Self-pay | Admitting: Family Medicine

## 2017-10-02 ENCOUNTER — Telehealth: Payer: Self-pay | Admitting: Family Medicine

## 2017-10-02 MED ORDER — CYCLOBENZAPRINE HCL 5 MG PO TABS: ORAL_TABLET | ORAL | 0 refills | Status: DC

## 2017-10-02 NOTE — Telephone Encounter (Signed)
Rx sent for #60 and I called the pt and left a detailed message with this info at the pts cell number.

## 2017-10-02 NOTE — Telephone Encounter (Signed)
Ok to send per pt request as 30 day or 90 day refill. Please change rx and notify pharmacy and patient. Thanks.

## 2017-10-02 NOTE — Telephone Encounter (Signed)
Copied from CRM #[id number]. Topic: Quick Communication - Rx Refill/Question >> Oct 02, 2017 10:13 AM Cipriano Bunker wrote: Medication:   cyclobenzaprine (FLEXERIL) 5 MG tablet 30 tablet 0 09/27/2017   Sig: TAKE 1 TABLET BY MOUTH TWICE A DAY AS NEEDED FOR MUSCLE SPASMS  Sent to pharmacy as: cyclobenzaprine (FLEXERIL) 5 MG tablet  Notes to Pharmacy: PATIENT REQUESTING AN RX FOR #60 (30 DAY SUPPLY) TAKES 1 BID. THANKS   This was sent in as 30 pills but should be for 60 pills (she takes 2 a day) If she gets 2 prescriptions she pays double the co-pay  Has the patient contacted their pharmacy? Yes.   (Agent: If no, request that the patient contact the pharmacy for the refill.) Preferred Pharmacy (with phone number or street name):   [id number] [place], [place] - [address] [place] [address] Phone: [phone] Fax: [phone]   Agent: Please be advised that RX refills may take up to 3 business days. We ask that you follow-up with your pharmacy.

## 2017-10-17 ENCOUNTER — Other Ambulatory Visit: Payer: Self-pay | Admitting: *Deleted

## 2017-10-17 MED ORDER — TRAMADOL HCL 50 MG PO TABS: 50.0000 mg | ORAL_TABLET | Freq: Two times a day (BID) | ORAL | 0 refills | Status: DC

## 2017-10-28 ENCOUNTER — Other Ambulatory Visit: Payer: Self-pay | Admitting: Family Medicine

## 2017-11-18 ENCOUNTER — Other Ambulatory Visit: Payer: Self-pay | Admitting: Physical Medicine & Rehabilitation

## 2017-11-24 ENCOUNTER — Other Ambulatory Visit: Payer: Self-pay | Admitting: Family Medicine

## 2017-12-18 ENCOUNTER — Other Ambulatory Visit: Payer: Self-pay | Admitting: Family Medicine

## 2017-12-19 NOTE — Telephone Encounter (Signed)
Can you call patient. Let her know we received a refill request. Please see if she remembers if the hematologist recommended that she eventually transition to a lower dose of eliquis or continue the current dose indefinitely? It is unclear from my review of the hematology notes, but sometimes can change to 2.5 eventually. Let her know, I did send message to hematology about this for their recommendations.  Ok to send the current dose for now for 1 month and 1 refill while waiting on their reply. Thanks.

## 2017-12-20 ENCOUNTER — Other Ambulatory Visit: Payer: Self-pay | Admitting: Physical Medicine & Rehabilitation

## 2017-12-20 ENCOUNTER — Encounter: Payer: Self-pay | Admitting: Family Medicine

## 2017-12-20 ENCOUNTER — Other Ambulatory Visit: Payer: Self-pay | Admitting: Family Medicine

## 2017-12-20 NOTE — Telephone Encounter (Signed)
Pt returning call. Please call back at [phone].

## 2017-12-20 NOTE — Telephone Encounter (Signed)
I left a message for the pt to return my call. 

## 2017-12-20 NOTE — Telephone Encounter (Signed)
Pt returned call from email to discuss medications with Chyrl CivatteJoAnn

## 2017-12-20 NOTE — Telephone Encounter (Signed)
It was Dr. Candise CheKale whom I contacted, waiting on reply.

## 2017-12-20 NOTE — Telephone Encounter (Signed)
I called the pt and informed her of the message below.  She stated she was not told to decrease the dose and is aware a 7843-month supply was sent to her pharmacy. She asked that the note be sent to Dr Candise CheKale as she was not comfortable seeing the first hematologist she was referred to.  Message sent to Dr Selena BattenKim.

## 2017-12-20 NOTE — Telephone Encounter (Signed)
See phone note within Rx request note.

## 2017-12-24 ENCOUNTER — Other Ambulatory Visit: Payer: Self-pay | Admitting: Family Medicine

## 2017-12-30 ENCOUNTER — Encounter: Payer: Self-pay | Admitting: Physical Medicine & Rehabilitation

## 2017-12-31 ENCOUNTER — Other Ambulatory Visit: Payer: Self-pay | Admitting: *Deleted

## 2017-12-31 MED ORDER — TRAMADOL HCL 50 MG PO TABS: ORAL_TABLET | ORAL | 0 refills | Status: DC

## 2018-01-07 ENCOUNTER — Other Ambulatory Visit: Payer: Self-pay

## 2018-01-07 ENCOUNTER — Encounter: Payer: Self-pay | Admitting: Physical Medicine & Rehabilitation

## 2018-01-07 ENCOUNTER — Encounter: Payer: PRIVATE HEALTH INSURANCE | Attending: Physical Medicine & Rehabilitation

## 2018-01-07 ENCOUNTER — Ambulatory Visit (HOSPITAL_BASED_OUTPATIENT_CLINIC_OR_DEPARTMENT_OTHER): Payer: PRIVATE HEALTH INSURANCE | Admitting: Physical Medicine & Rehabilitation

## 2018-01-07 VITALS — BP 125/85 | HR 83 | Ht 65.0 in | Wt 150.6 lb

## 2018-01-07 DIAGNOSIS — M4186 Other forms of scoliosis, lumbar region: Secondary | ICD-10-CM | POA: Diagnosis not present

## 2018-01-07 DIAGNOSIS — M47816 Spondylosis without myelopathy or radiculopathy, lumbar region: Secondary | ICD-10-CM | POA: Diagnosis not present

## 2018-01-07 DIAGNOSIS — Z86718 Personal history of other venous thrombosis and embolism: Secondary | ICD-10-CM | POA: Diagnosis not present

## 2018-01-07 DIAGNOSIS — K219 Gastro-esophageal reflux disease without esophagitis: Secondary | ICD-10-CM | POA: Diagnosis not present

## 2018-01-07 DIAGNOSIS — F329 Major depressive disorder, single episode, unspecified: Secondary | ICD-10-CM | POA: Diagnosis not present

## 2018-01-07 DIAGNOSIS — Z79899 Other long term (current) drug therapy: Secondary | ICD-10-CM | POA: Diagnosis not present

## 2018-01-07 DIAGNOSIS — M797 Fibromyalgia: Secondary | ICD-10-CM | POA: Diagnosis present

## 2018-01-07 DIAGNOSIS — Z5181 Encounter for therapeutic drug level monitoring: Secondary | ICD-10-CM | POA: Diagnosis not present

## 2018-01-07 DIAGNOSIS — Z86711 Personal history of pulmonary embolism: Secondary | ICD-10-CM | POA: Insufficient documentation

## 2018-01-07 DIAGNOSIS — I10 Essential (primary) hypertension: Secondary | ICD-10-CM | POA: Insufficient documentation

## 2018-01-07 NOTE — Progress Notes (Addendum)
Subjective:    Patient ID: Faith Thomas, female    DOB: 06-22-1960, 57 y.o.   MRN: [id number]  HPI  57 year old female with history of methylene tetrahydrofolate reductase mutation with history of hypercoagulable state who follows up with chief complaint of migratory body pains.  The patient has a history of fibromyalgia syndrome The patient has been going to outpatient physical therapy.  She has an exercise program that consists of approximately 5 exercises done in a group.  She has a different group of exercises every day, a total of 5 days/week. She feels that she has been flared up after her exercises and cannot explain this. She has some consistent pain on the left side of her back starting in the mid back going down to the upper sacral area.  She also has buttock pain with occasional tightness in the gluteal area. She does not have any pain going into the foot or below the knee. She feels tight in her hips but no groin pain. No bowel or bladder incontinence or urgency. Patient has had some social stressors recently.  She is planning a vacation.  Reviewed neuropsychology evaluation, visual memory and attention deficits.  See if Pain Inventory Average Pain 7 Pain Right Now 6 My pain is constant, sharp, dull, stabbing and aching  In the last 24 hours, has pain interfered with the following? General activity 8 Relation with others 7 Enjoyment of life 8 What TIME of day is your pain at its worst? na Sleep (in general) NA  Pain is worse with: bending and some activites Pain improves with: rest, heat/ice, therapy/exercise and medication Relief from Meds: 4  Mobility walk without assistance ability to climb steps?  yes do you drive?  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 I need assistance with the following:  meal prep, household duties and shopping  Neuro/Psych bladder control  problems weakness confusion depression anxiety  Prior Studies Any changes since last visit?  no  Physicians involved in your care Any changes since last visit?  no   Family History  Problem Relation Age of Onset  . Arthritis Mother   . Hypertension Mother   . Mental illness Mother   . Arthritis Father   . Hypertension Father   . Mental illness Father   . Mental illness Brother   . Arthritis Daughter   . Cancer Maternal Aunt   . Hypertension Maternal Grandmother   . Arthritis Maternal Grandfather    Social History   Socioeconomic History  . Marital status: Divorced    Spouse name: Not on file  . Number of children: Not on file  . Years of education: Not on file  . Highest education level: Not on file  Occupational History  . Not on file  Social Needs  . Financial resource strain: Not on file  . Food insecurity:    Worry: Not on file    Inability: Not on file  . Transportation needs:    Medical: Not on file    Non-medical: Not on file  Tobacco Use  . Smoking status: Never Smoker  . Smokeless tobacco: Never Used  Substance and Sexual Activity  . Alcohol use: Yes    Alcohol/week: 0.0 oz    Comment: occasional   . Drug use: No  . Sexual activity: Not on file  Lifestyle  . Physical activity:    Days per week: Not on file  Minutes per session: Not on file  . Stress: Not on file  Relationships  . Social connections:    Talks on phone: Not on file    Gets together: Not on file    Attends religious service: Not on file    Active member of club or organization: Not on file    Attends meetings of clubs or organizations: Not on file    Relationship status: Not on file  Other Topics Concern  . 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 Past Medical History:  Diagnosis Date  . Allergy   . Depression   . DVT (deep venous thrombosis) (HCC)   . Fibromyalgia   . Genetic defect   . GERD (gastroesophageal reflux disease)   . 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 Hypertension   . PE (pulmonary thromboembolism) (HCC)   . Positive TB test   . Urine incontinence    BP 125/85   Pulse 83   Ht 5\' 5"  (1.651 m) Comment: reported  Wt 150 lb 9.6 oz (68.3 kg)   SpO2 98%   BMI 25.06 kg/m   Opioid Risk Score:   Fall Risk Score:  `1  Depression screen PHQ 2/9  [facility] 01/07/2018 09/06/2017  Decreased Interest 3 0  Down, Depressed, Hopeless 3 2  PHQ - 2 Score 6 2    Review of Systems  Constitutional: Positive for unexpected weight change.  HENT: Negative.   Eyes: Negative.   Respiratory: Negative.   Cardiovascular: Negative.   Gastrointestinal: Positive for constipation and diarrhea.  Endocrine: Negative.   Genitourinary: Negative.   Musculoskeletal: Negative.   Skin: Negative.   Allergic/Immunologic: Negative.   Neurological: Positive for weakness.  Hematological: Bruises/bleeds easily.  Psychiatric/Behavioral: Positive for confusion and dysphoric mood. The patient is nervous/anxious.   All other systems reviewed and are negative.      Objective:   Physical Exam  Constitutional: She is oriented to person, place, and time. She appears well-developed and well-nourished.  HENT:  Head: Normocephalic and atraumatic.  Eyes: Pupils are equal, round, and reactive to light. EOM are normal.  Neck: Normal range of motion. Neck supple.  Neurological: She is alert and oriented to person, place, and time.  Skin: Skin is warm and dry.  Psychiatric: Her speech is normal and behavior is normal. Her affect is labile. Cognition and memory are normal.  Cries easily  Nursing note and vitals reviewed.  Motor strength is 5/5 bilateral hip flexor knee extensor  ankle dorsiflexor Lumbar flexibility is normal She does have tightness with hip abductors right side greater than left side. Lumbar spine shows a mild levoconvex scoliosis with mild compensatory curve in the thoracic area toward the right side There is increased tone along the paraspinal on the left although this could be related to a rotatory component Sensation normal bilateral lower extremities She has no tenderness to palpation along the trapezius or parascapular area there is tenderness to palpation over the left lateral epicondyle.  Mild tenderness palpation at the cervical occipital junction. No tenderness over the gluteal musculature. Deep tendon reflexes normal bilateral lower limbs      Assessment &  Plan:  1.  Fibromyalgia syndrome will continue tramadol 50 mg twice daily as well as Lyrica 50 mg 3 times daily  We reviewed her exercise program in have requested that the patient only does one exercise of the 5 listed, see if this causes a flareup that lasts more than 2 days and if not add the next exercise the following  Session, she is to record if any exercise causes a "flare"  2.  Lumbar spondylosis as well as scoliosis.  This is a daily constant pain.  Recommend lumbar medial branch blocks L3-4 as well as L5 dorsal ramus injection under fluoroscopic guidance.  Consider going up to L2 level as well  Over half of the visit was spent counseling and coordinating care.  Because she is on tramadol will do urine screen or oral swab. Opioid consent reviewed today Indication for chronic opioid: lumbar spodylosis scoliosis, FM Medication and dose: tramadol 50mg   # pills per month: 60 Last UDS date: today Opioid Treatment Agreement signed (Y/N): y Opioid Treatment Agreement last reviewed with patient:  y [facility] reviewed this encounter (include red flags):  y

## 2018-01-07 NOTE — Patient Instructions (Addendum)
Lumbar medial branch block Left side  Try one exercise at a time if no flare add the next one on the list , keep notes on exercise that flares

## 2018-01-08 ENCOUNTER — Telehealth: Payer: Self-pay

## 2018-01-08 ENCOUNTER — Encounter: Payer: Self-pay | Admitting: Family Medicine

## 2018-01-08 NOTE — Telephone Encounter (Signed)
Returned call to Domenic MorasJo Funderburk,CMA  for Dr. Debbe OdeaKim,in regards to voicemail received about Eliquis dosing. Informed her that the patient has not been seen by Dr. Candise CheKale in over a year and per Dr. Clyda GreenerKale's last office note in August 2018 pt "would need to follow-up with her primary care physician for continued refills on her anticoagulation and for lab monitoring with regards to renal and liver functions every 6 months". Alvino ChapelJo stated she will make Dr. Selena BattenKim aware.

## 2018-01-08 NOTE — Telephone Encounter (Signed)
See Mychart note 

## 2018-01-08 NOTE — Telephone Encounter (Signed)
Faith CollumJo Anne, I left a message for her to call back. Please discuss with her per our discussion when she calls back. Thanks.

## 2018-01-08 NOTE — Telephone Encounter (Signed)
I called the cancer center [phone] and left a detailed message at the nurses voicemail for Dr Candise CheKale with the information below.

## 2018-01-08 NOTE — Telephone Encounter (Signed)
Dorene Grebeatalie called back and stated they have not seen in the patient in over a year and Dr Candise CheKale advised in his last office note that she see her PCP to follow up for continued refills and lab monitoring every 6 months.

## 2018-01-08 NOTE — Telephone Encounter (Signed)
-----   Message from Terressa KoyanagiHannah R Kim, DO sent at 01/08/2018  9:56 AM EDT ----- Could you please call Dr. Clyda GreenerKale's office, hematology,  to request what long term dose of her eliquis they would recommend for our mutual patient. It was not clear from notes what they decided on. I sent a message to Dr. Candise CheKale some time ago, but did not receive a reply so want to make sure received and get back to the patient about this. Let them know we appreciate their help.

## 2018-01-08 NOTE — Telephone Encounter (Signed)
Patient said whichever medication refill you are comfortable she will just go take that because she said she doesn't have time to go back and forth.

## 2018-01-10 LAB — DRUG TOX MONITOR 1 W/CONF, ORAL FLD
AMPHETAMINES: NEGATIVE ng/mL (ref <10)
BARBITURATES: NEGATIVE ng/mL (ref <10)
BENZODIAZEPINES: NEGATIVE ng/mL (ref <0.50)
BUPRENORPHINE: NEGATIVE ng/mL (ref <0.10)
COCAINE: NEGATIVE ng/mL (ref <5.0)
Fentanyl: NEGATIVE ng/mL (ref <0.10)
Heroin Metabolite: NEGATIVE ng/mL (ref <1.0)
MARIJUANA: NEGATIVE ng/mL (ref <2.5)
MDMA: NEGATIVE ng/mL (ref <10)
METHADONE: NEGATIVE ng/mL (ref <5.0)
Meprobamate: NEGATIVE ng/mL (ref <2.5)
NICOTINE METABOLITE: NEGATIVE ng/mL (ref <5.0)
OPIATES: NEGATIVE ng/mL (ref <2.5)
Phencyclidine: NEGATIVE ng/mL (ref <10)
Tapentadol: NEGATIVE ng/mL (ref <5.0)
Tramadol: 320.1 ng/mL — ABNORMAL HIGH (ref <5.0)
Tramadol: POSITIVE ng/mL — AB (ref <5.0)
Zolpidem: NEGATIVE ng/mL (ref <5.0)

## 2018-01-10 LAB — DRUG TOX ALC METAB W/CON, ORAL FLD: ALCOHOL METABOLITE: NEGATIVE ng/mL (ref <25)

## 2018-01-10 NOTE — Telephone Encounter (Signed)
I left a message for the pt to return my call. 

## 2018-01-11 ENCOUNTER — Other Ambulatory Visit: Payer: Self-pay | Admitting: Family Medicine

## 2018-01-11 MED ORDER — APIXABAN 2.5 MG PO TABS: 2.5000 mg | ORAL_TABLET | Freq: Two times a day (BID) | ORAL | 2 refills | Status: DC

## 2018-01-11 NOTE — Telephone Encounter (Signed)
See My chart message

## 2018-01-18 ENCOUNTER — Other Ambulatory Visit: Payer: Self-pay | Admitting: Family Medicine

## 2018-01-21 ENCOUNTER — Encounter: Payer: Self-pay | Admitting: Family Medicine

## 2018-01-22 ENCOUNTER — Telehealth: Payer: Self-pay | Admitting: Family Medicine

## 2018-01-22 NOTE — Telephone Encounter (Signed)
Faith Thomas saw Faith Thomas when Faith Thomas had a blood clot (PE) and concerns about Faith Thomas MTHFR gene mutation. Faith Thomas then requested refills of Faith Thomas elequis here. I reached out to Faith Thomas to see if he felt Faith Thomas should continue the 5mg  bid or go to a prophylactic dose of 2.5 mg bid. There was quite a delay in a response, and after contacting his office several times, his office staff told Faith Thomas they could not assist Faith Thomas as the patient had not been seen in over 1 year. However, I received a reply from Faith Thomas yesterday. I appreciate his follow up.  I reached out to Faith Thomas and spoke with Faith Thomas today. I relayed Dr. Clyda GreenerKale's message. Faith Thomas has decided after Faith Thomas own research that Faith Thomas would like to do the prophylactic dose of the 2.5 mg bid instead of the 5mg  dose for now.

## 2018-01-22 NOTE — Telephone Encounter (Signed)
-----   Message from Johney MaineGautam Kishore Kale, MD sent at 01/21/2018  1:37 PM EDT ----- Hello Dr Dahlia ClientHannah, Sorry for the delayed reponse. I had seen Ms Sharyne RichtersWillingham about [id number] ago. The plan was to keep her on Eliquis 5mg  po BID long term unless her bleeding risk profile changes or she chooses to go to a prophylactic dose of Eliquis (does not have to). MTHFR gene mutation would not have a bearing on these recommendations.  Thanks GK  ----- Message ----- From: Terressa KoyanagiKim, Regena Delucchi R, DO Sent: 12/18/2017   2:13 PM EDT To: Johney MaineGautam Kishore Kale, MD  Hi Dr. Candise CheKale,  Can you please assist me with this mutual patient. She is on Eliquis 5mg  bid for a hx of bilat DVT and submassive PE in 2018 and I received a refill request for her Eliquis today. It is unclear from reading her notes whether you wanted her to continue 5mg  bid or go to 2.5 bid after 6 months. She is quite anxious about her hx of this and a possible MTHFR mutation. Can you please let me know what you would recommend?  Thanks in advance!  Dahlia ClientHannah

## 2018-01-23 NOTE — Telephone Encounter (Signed)
Hi Dr. Kim,    after having a Faith Battenlittle more time to think through our conversation yesterday, I am thinking I would like to stay at the 5 mg dosage for now. as I have had more time to reflect, I think I would like to wait until I have an opportunity to see Dr Faith Thomas again before making a change in the dosage. I will be planning to see him early this next year. Is that okay with you? If, after we're flexion, you feel it is best to drop to the 2.5 mg, I am okay with that also.    Thanks,  Faith Thomas

## 2018-01-24 MED ORDER — APIXABAN 5 MG PO TABS: 5.0000 mg | ORAL_TABLET | Freq: Two times a day (BID) | ORAL | 1 refills | Status: DC

## 2018-01-24 NOTE — Telephone Encounter (Signed)
-----   Message from Terressa KoyanagiHannah R Kim, DO sent at 01/24/2018  7:14 AM EDT ----- Ronnald CollumJo Anne,  Corrie DandyMary wants to change back to the 5mg  bid of the Eliquis. Can you please call the pharmacy to make this change and also update the medication list? 3 months supply with 1 refill. Thank you.  Dr. Selena BattenKim

## 2018-01-28 ENCOUNTER — Ambulatory Visit: Payer: PRIVATE HEALTH INSURANCE | Admitting: Physical Medicine & Rehabilitation

## 2018-01-30 ENCOUNTER — Other Ambulatory Visit: Payer: Self-pay | Admitting: Family Medicine

## 2018-01-30 ENCOUNTER — Other Ambulatory Visit: Payer: Self-pay

## 2018-01-30 MED ORDER — TRAMADOL HCL 50 MG PO TABS: ORAL_TABLET | ORAL | 0 refills | Status: DC

## 2018-01-30 NOTE — Telephone Encounter (Signed)
Tramadol refilled.

## 2018-02-01 NOTE — Telephone Encounter (Signed)
Faith Thomas, I think she emailed us a while back and asked that we let Faith Thomas handle her muscle relaxer refills? Can you check and see. Ok to refill though if she wishes. Thanks.

## 2018-02-03 ENCOUNTER — Other Ambulatory Visit: Payer: Self-pay | Admitting: Physical Medicine & Rehabilitation

## 2018-02-08 ENCOUNTER — Ambulatory Visit: Payer: PRIVATE HEALTH INSURANCE | Admitting: Physical Medicine & Rehabilitation

## 2018-02-14 ENCOUNTER — Other Ambulatory Visit: Payer: Self-pay | Admitting: Family Medicine

## 2018-02-15 NOTE — Telephone Encounter (Signed)
Please call PHARMACY. I believe patient asked us NOT to refill this any longer and to let her back specialist manage this medication. I am happy to rx for her, but she specifically asked us not to. Please let pharmacy know so they can change ordering provider to Dr. Wynn BankerKirsteins. Thanks.

## 2018-02-18 NOTE — Telephone Encounter (Signed)
[facility] and informed [place] of the message below.  She stated she will forward this to Dr Wynn BankerKirsteins.

## 2018-02-19 ENCOUNTER — Other Ambulatory Visit: Payer: Self-pay | Admitting: Family Medicine

## 2018-02-19 NOTE — Telephone Encounter (Signed)
Copied from CRM [id number]. Topic: Quick Communication - Rx Refill/Question >> Feb 19, 2018  3:30 PM Raoul PitchWilliams-Neal, Sade R wrote: Medication: cyclobenzaprine (FLEXERIL) 5 MG tablet  Has the patient contacted their pharmacy? Yes  Preferred Pharmacy (with phone number or street name): [phone] [place] [facility] [place] - [address] [phone] (Phone) [phone] (Fax)    Agent: Please be advised that RX refills may take up to 3 business days. We ask that you follow-up with your pharmacy.

## 2018-02-19 NOTE — Telephone Encounter (Signed)
Flexeril refill Last Refill: 12/24/17, # 60, no refills Last OV: 09/06/17 PCP: Dr. Selena BattenKim [facility] [address]  Per note of Dr. Selena BattenKim on 09/06/17, Dr. Wynn BankerKirsteins is managing Fibromyalgia.  Please advise on refill of Flexeril.

## 2018-02-24 ENCOUNTER — Other Ambulatory Visit: Payer: Self-pay | Admitting: Family Medicine

## 2018-02-25 ENCOUNTER — Telehealth: Payer: Self-pay

## 2018-02-25 MED ORDER — CYCLOBENZAPRINE HCL 5 MG PO TABS: 5.0000 mg | ORAL_TABLET | Freq: Two times a day (BID) | ORAL | 3 refills | Status: DC

## 2018-02-25 NOTE — Telephone Encounter (Signed)
Medication ordered

## 2018-02-25 NOTE — Telephone Encounter (Signed)
Pt called stating that she spoke Dr. Wynn BankerKirsteins about taking over prescribing Cyclobenzaprine. Is it ok to call in medication for pt?

## 2018-02-25 NOTE — Telephone Encounter (Signed)
Call in flexeril 5mg  BID, #60, 3 RF

## 2018-03-04 ENCOUNTER — Other Ambulatory Visit: Payer: Self-pay

## 2018-03-04 MED ORDER — TRAMADOL HCL 50 MG PO TABS: ORAL_TABLET | ORAL | 2 refills | Status: DC

## 2018-03-11 ENCOUNTER — Other Ambulatory Visit (HOSPITAL_COMMUNITY): Payer: Self-pay | Admitting: Family Medicine

## 2018-03-14 ENCOUNTER — Other Ambulatory Visit: Payer: Self-pay | Admitting: Physical Medicine & Rehabilitation

## 2018-03-14 ENCOUNTER — Other Ambulatory Visit (HOSPITAL_COMMUNITY): Payer: Self-pay | Admitting: Family Medicine

## 2018-03-14 DIAGNOSIS — R6889 Other general symptoms and signs: Secondary | ICD-10-CM

## 2018-03-14 DIAGNOSIS — I50810 Right heart failure, unspecified: Secondary | ICD-10-CM

## 2018-03-26 ENCOUNTER — Encounter: Payer: Self-pay | Admitting: Physical Medicine & Rehabilitation

## 2018-03-26 ENCOUNTER — Ambulatory Visit (HOSPITAL_BASED_OUTPATIENT_CLINIC_OR_DEPARTMENT_OTHER): Payer: PRIVATE HEALTH INSURANCE | Admitting: Physical Medicine & Rehabilitation

## 2018-03-26 ENCOUNTER — Encounter: Payer: PRIVATE HEALTH INSURANCE | Attending: Physical Medicine & Rehabilitation

## 2018-03-26 VITALS — BP 113/78 | HR 88 | Resp 14 | Ht 65.0 in | Wt 156.0 lb

## 2018-03-26 DIAGNOSIS — I10 Essential (primary) hypertension: Secondary | ICD-10-CM | POA: Insufficient documentation

## 2018-03-26 DIAGNOSIS — Z86711 Personal history of pulmonary embolism: Secondary | ICD-10-CM | POA: Diagnosis not present

## 2018-03-26 DIAGNOSIS — M4186 Other forms of scoliosis, lumbar region: Secondary | ICD-10-CM | POA: Insufficient documentation

## 2018-03-26 DIAGNOSIS — Z1589 Genetic susceptibility to other disease: Secondary | ICD-10-CM

## 2018-03-26 DIAGNOSIS — F329 Major depressive disorder, single episode, unspecified: Secondary | ICD-10-CM | POA: Diagnosis not present

## 2018-03-26 DIAGNOSIS — Z86718 Personal history of other venous thrombosis and embolism: Secondary | ICD-10-CM | POA: Diagnosis not present

## 2018-03-26 DIAGNOSIS — E7212 Methylenetetrahydrofolate reductase deficiency: Secondary | ICD-10-CM | POA: Diagnosis not present

## 2018-03-26 DIAGNOSIS — M47816 Spondylosis without myelopathy or radiculopathy, lumbar region: Secondary | ICD-10-CM | POA: Diagnosis not present

## 2018-03-26 DIAGNOSIS — K219 Gastro-esophageal reflux disease without esophagitis: Secondary | ICD-10-CM | POA: Diagnosis not present

## 2018-03-26 DIAGNOSIS — M797 Fibromyalgia: Secondary | ICD-10-CM | POA: Insufficient documentation

## 2018-03-26 NOTE — Progress Notes (Signed)
Subjective:    Patient ID: Faith Thomas, female    DOB: February 20, 1961, 57 y.o.   MRN: [id number]  HPI  Sees new PCP  Groggy during the day looking for other treatment options Discussed current medication management.  The patient is going through his occult therapy which has included soft tissue manipulation, dry needling, ultrasound, neuromuscular education as well as postural education manual therapies.  She has had several flareups which have set her back.  Her therapist feel like an additional 6 9 visit would be helpful.  We discussed that I would be in agreement with this assessment. Patient is also now realizing that her condition is chronic and will not be something that can be fixed with natural remedies Pain Inventory Average Pain 7 Pain Right Now 5 My pain is sharp, dull, stabbing, tingling and aching  In the last 24 hours, has pain interfered with the following? General activity 4 Relation with others 4 Enjoyment of life 4 What TIME of day is your pain at its worst? varies Sleep (in general) Good  Pain is worse with: some activites Pain improves with: rest, heat/ice, therapy/exercise and medication Relief from Meds: 4  Mobility walk without assistance how many minutes can you walk? 15-20 ability to climb steps?  yes do you drive?  yes Do you have any goals in this area?  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  yes  Neuro/Psych weakness numbness tingling spasms confusion depression anxiety suicidal thoughts-no active plans  Prior Studies Any changes since last visit?  no  Physicians involved in your care Any changes since last visit?  no   Family History  Problem Relation Age of Onset  . Arthritis Mother   . Hypertension Mother   . Mental illness Mother   . Arthritis Father   .  Hypertension Father   . Mental illness Father   . Mental illness Brother   . Arthritis Daughter   . Cancer Maternal Aunt   . Hypertension Maternal Grandmother   . Arthritis Maternal Grandfather    Social History   Socioeconomic History  . Marital status: Divorced    Spouse name: Not on file  . Number of children: Not on file  . Years of education: Not on file  . Highest education level: Not on file  Occupational History  . Not on file  Social Needs  . Financial resource strain: Not on file  . Food insecurity:    Worry: Not on file    Inability: Not on file  . Transportation needs:    Medical: Not on file    Non-medical: Not on file  Tobacco Use  . Smoking status: Never Smoker  . Smokeless tobacco: Never Used  Substance and Sexual Activity  . Alcohol use: Yes    Alcohol/week: 0.0 standard drinks    Comment: occasional   . Drug use: No  . Sexual activity: Not on file  Lifestyle  . Physical activity:    Days per week: Not on file    Minutes per session: Not on file  . Stress: Not on file  Relationships  . Social connections:    Talks on phone: Not on file    Gets together: Not on file    Attends religious service: Not on file    Active member of club or  organization: Not on file    Attends meetings of clubs or organizations: Not on file    Relationship status: Not on file  Other Topics Concern  . 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 No pertinent surgical history. Past Medical History:  Diagnosis Date  . Allergy   . Depression   . DVT (deep venous thrombosis) (HCC)   . Fibromyalgia   . Genetic defect   . GERD (gastroesophageal reflux disease)   . 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 Hypertension     . PE (pulmonary thromboembolism) (HCC)   . Positive TB test   . Urine incontinence    BP 113/78   Pulse 88   Resp 14   Ht 5\' 5"  (1.651 m)   Wt 156 lb (70.8 kg)   SpO2 94%   BMI 25.96 kg/m   Opioid Risk Score:   Fall Risk Score:  `1  Depression screen PHQ 2/9  Depression screen [facility] 2/9 01/07/2018 09/06/2017  Decreased Interest 3 0  Down, Depressed, Hopeless 3 2  PHQ - 2 Score 6 2    Review of Systems  Constitutional: Negative.   HENT: Negative.   Eyes: Negative.   Respiratory: Negative.   Cardiovascular: Negative.   Gastrointestinal: Positive for constipation.  Endocrine: Negative.   Genitourinary: Negative.   Musculoskeletal: Positive for arthralgias, back pain, neck pain and neck stiffness.       Spasms   Skin: Negative.   Allergic/Immunologic: Negative.   Neurological: Positive for weakness and numbness.       Tingling  Hematological: Bruises/bleeds easily.  Psychiatric/Behavioral: Positive for suicidal ideas.       No active plans        Objective:   Physical Exam  Constitutional: She is oriented to person, place, and time. She appears well-developed and well-nourished.  HENT:  Head: Normocephalic and atraumatic.  Neurological: She is alert and oriented to person, place, and time.  Psychiatric: She has a normal mood and affect. Her behavior is normal. Judgment and thought content normal.  Nursing note and vitals reviewed.  Mild tenderness palpation along the thoracic paraspinal muscle cervical paraspinal and upper trapezius are without tenderness.  Mild tenderness in the lumbar area as well as across the gluteal region. She has good range of motion hips and knees ankles. Negative straight leg raising Motor strength 5/5 bilateral deltoid bicep tricep grip hip flexor knee extensor ankle dorsiflexor Gait is without evidence of toe drag or knee instability Mood and affect without evidence of lability or agitation       Assessment & Plan:  1.   Fibromyalgia syndrome I believe this is her primary pain complaint.  We discussed medication management including cyclobenzaprine which may cause her drowsiness.  I have recommended that she start with 5 mg nightly we may have to go up to 10 mg nightly.  This would aid with sleep as well. Change Lyrica to 50 mg in the morning and 100 mg nightly. We discussed aquatic exercise as recommendation.  Specifically we talked about the [employer] aquatic exercise class which is approved by the [employer] of rheumatology.  Other potential options would include one-on-one aquatic therapy that can  teach her a home exercise program that she can perform on her own. Physical medicine rehab follow-up in 3 months

## 2018-03-26 NOTE — Patient Instructions (Addendum)
Check with [employer] aquatics  Take cyclobenzaprine at night only  Take Lyrica 50mg  in am and 100mg  at night

## 2018-03-27 ENCOUNTER — Ambulatory Visit (HOSPITAL_COMMUNITY): Payer: PRIVATE HEALTH INSURANCE | Attending: Cardiology

## 2018-03-27 ENCOUNTER — Other Ambulatory Visit: Payer: Self-pay

## 2018-03-27 DIAGNOSIS — I50810 Right heart failure, unspecified: Secondary | ICD-10-CM | POA: Diagnosis present

## 2018-03-27 DIAGNOSIS — R6889 Other general symptoms and signs: Secondary | ICD-10-CM | POA: Diagnosis present

## 2018-03-29 NOTE — Telephone Encounter (Signed)
Faith Thomas is aware of letter and will fax it today for patient.

## 2018-03-31 DIAGNOSIS — F341 Dysthymic disorder: Secondary | ICD-10-CM | POA: Insufficient documentation

## 2018-03-31 DIAGNOSIS — F411 Generalized anxiety disorder: Secondary | ICD-10-CM

## 2018-04-02 ENCOUNTER — Telehealth: Payer: Self-pay | Admitting: Physical Medicine & Rehabilitation

## 2018-04-02 NOTE — Telephone Encounter (Signed)
Patient my chart message regarding lab.  I called to get understanding for question .  She believes the cost is exorbitant.  I advised I will question cost difference between two - she believes patient should know cost if labs are drawn in advance. Good point -.  I explain contract for medication on file - labs are necessary if we prescribe certain RX.    Questioned difference in Urine vs swab. Didn't believe this was part of her care.  I advised in order to continue RX it is part of treatment.  I told her I would question the cost of urine vs swab -as we are not the billing provider.  She states she knew she had to swab because time of day [facility] was closing and Swab was needed in place of urine.  Emailed Diplomatic Services operational officer for [employer] as Dyane Dustman is a known charge based on her billing.

## 2018-04-12 ENCOUNTER — Encounter: Payer: Self-pay | Admitting: Psychiatry

## 2018-04-12 ENCOUNTER — Ambulatory Visit (INDEPENDENT_AMBULATORY_CARE_PROVIDER_SITE_OTHER): Payer: PRIVATE HEALTH INSURANCE | Admitting: Psychiatry

## 2018-04-12 VITALS — BP 121/88 | HR 76

## 2018-04-12 DIAGNOSIS — F5101 Primary insomnia: Secondary | ICD-10-CM | POA: Diagnosis not present

## 2018-04-12 DIAGNOSIS — F3340 Major depressive disorder, recurrent, in remission, unspecified: Secondary | ICD-10-CM

## 2018-04-12 DIAGNOSIS — F411 Generalized anxiety disorder: Secondary | ICD-10-CM | POA: Diagnosis not present

## 2018-04-12 MED ORDER — ALPRAZOLAM 0.5 MG PO TABS: ORAL_TABLET | ORAL | 5 refills | Status: DC

## 2018-04-12 MED ORDER — BUSPIRONE HCL 30 MG PO TABS: 30.0000 mg | ORAL_TABLET | Freq: Two times a day (BID) | ORAL | 1 refills | Status: DC

## 2018-04-12 MED ORDER — SILENOR 6 MG PO TABS: 1.0000 | ORAL_TABLET | Freq: Every day | ORAL | 1 refills | Status: DC

## 2018-04-12 MED ORDER — BUPROPION HCL ER (XL) 150 MG PO TB24: 150.0000 mg | ORAL_TABLET | Freq: Every day | ORAL | 1 refills | Status: DC

## 2018-04-12 MED ORDER — SERTRALINE HCL 100 MG PO TABS: 200.0000 mg | ORAL_TABLET | Freq: Every day | ORAL | 1 refills | Status: DC

## 2018-04-12 NOTE — Progress Notes (Signed)
[name] [id number] 05-15-1961 57 y.o.  Subjective:   Patient ID:  Faith Thomas is a 57 y.o. (DOB 1960/12/21) female.  Chief Complaint:  Chief Complaint  Patient presents with  . Depression  . Anxiety    HPI Faith Thomas presents to the office today for follow-up of anxiety and depression."I've really been up and down, probably more down than up." She reports that she has learned that her medical condition is more permanent and now is less hopeful. She reports that her daughter and granddaughter have been living with her and reports that this has been physically difficulty for pt. Reports that daughter and granddaughter are now staying in an extended stay hotel. She reports daughter is "unrealistic" at times and that this is concerning to her. She reports that she has had some worry and anxiety in response to these events, but reports some recent improvement in anxiety.   Reports mood was lower a month ago due to increased pain and 57th birthday and reports some passive death wishes at that time. She reports mood has improved compared to a month ago, however mood remains persistently depressed. She reports some possible mild irritability. She reports that her sleep has improved. Reports that she has reduced Xanax to one tablet at bedtime. She reports that her energy and motivation have been low. Reports that she has been working with medical providers to move potentially sedating medications to bedtime to improve daytime somnolence. She reports that her concentration "is better." She reports that she continues to have occasional word finding errors. Denies SI.   She reports that she recently requested a lunch break and her supervisor is in the process of getting this approved.   Review of Systems:  Review of Systems  Constitutional: Positive for fatigue.  Musculoskeletal: Positive for back pain and myalgias.  Neurological: Positive for weakness, numbness and  headaches. Negative for tremors.    She reports that she changed PCP's to Faith Coombes, MD.   Medications: I have reviewed the patient's current medications.  Current Outpatient Medications  Medication Sig Dispense Refill  . acetaminophen (TYLENOL) 500 MG tablet Take 1,000 mg by mouth every 8 (eight) hours as needed.     Melene Muller ON 04/24/2018] ALPRAZolam (XANAX) 0.5 MG tablet Take 1 tab po QHS and 1 tab po qd prn anxiety 60 tablet 5  . apixaban (ELIQUIS) 5 MG TABS tablet Take 1 tablet (5 mg total) by mouth 2 (two) times daily. 180 tablet 1  . b complex vitamins capsule Take 1 capsule by mouth daily.    . busPIRone (BUSPAR) 30 MG tablet Take 1 tablet (30 mg total) by mouth 2 (two) times daily. 180 tablet 1  . Cholecalciferol (VITAMIN D-3) 1000 units CAPS Take 10,000 Units by mouth daily.     . cyclobenzaprine (FLEXERIL) 5 MG tablet Take 1 tablet (5 mg total) by mouth 2 (two) times daily. 60 tablet 3  . ELIQUIS 5 MG TABS tablet TAKE 1 TABLET (5 MG TOTAL) BY MOUTH 2 (TWO) TIMES DAILY WITH A MEAL. 60 tablet 0  . L-Methylfolate-Algae (DEPLIN 15) 15-90.314 MG CAPS Take 1 capsule by mouth daily.  3  . lisinopril (PRINIVIL,ZESTRIL) 10 MG tablet TAKE 1 TABLET BY MOUTH EVERY DAY 34 tablet 2  . Methylcobalamin (B-12) 5000 MCG TBDP Inject 0.1 mLs into the muscle once a week.     . Omega 3 1200 MG CAPS Take 1,200 mg by mouth daily.    . pregabalin (LYRICA) 50  MG capsule TAKE 1 CAPSULE BY MOUTH THREE TIMES A DAY 90 capsule 5  . Probiotic Product (PROBIOTIC PO) Take 1 capsule by mouth daily.     . sertraline (ZOLOFT) 100 MG tablet Take 2 tablets (200 mg total) by mouth at bedtime. 180 tablet 1  . SILENOR 6 MG TABS Take 1 tablet (6 mg total) by mouth at bedtime. 90 tablet 1  . traMADol (ULTRAM) 50 MG tablet TAKE 1 TABLET BY MOUTH TWICE A DAY AS NEEDED FOR MODERATE PAIN 60 tablet 2  . vitamin C (ASCORBIC ACID) 500 MG tablet Take 1,000 mg by mouth daily.     Marland Kitchen buPROPion (WELLBUTRIN XL) 150 MG 24 hr tablet  Take 1 tablet (150 mg total) by mouth daily. 90 tablet 1  . L-Methylfolate-Algae (DEPLIN 15 PO) Take by mouth.    . mometasone (NASONEX) 50 MCG/ACT nasal spray USE ONE SPRAY IN EACH NOSTRIL ONE TIME DAILY (Patient not taking: Reported on 04/12/2018) 17 g 5   No current facility-administered medications for this visit.     Medication Side Effects: None  Allergies:  Allergies  Allergen Reactions  . Citalopram Tinitus  . Escitalopram Tinitus  . Bee Venom Swelling  . Latex Swelling  . Penicillins Swelling and Rash    Has patient had a PCN reaction causing immediate rash, facial/tongue/throat swelling, SOB or lightheadedness with hypotension: Yes Has patient had a PCN reaction causing severe rash involving mucus membranes or skin necrosis: No Has patient had a PCN reaction that required hospitalization: No Has patient had a PCN reaction occurring within the last 10 years: No If all of the above answers are "NO", then may proceed with Cephalosporin use.  Marland Kitchen Cymbalta [Duloxetine Hcl]     -memory loss  . Escitalopram Oxalate Tinitus  . Other     Yeast creams- causes secondary infections, itching, burning, redness   . Progesterone     Pt feels caused weakness, fatigue, cognitive deficits  . Trintellix [Vortioxetine]     Memory problems  . Duloxetine Other (See Comments)    Past Medical History:  Diagnosis Date  . Allergy   . Depression   . DVT (deep venous thrombosis) (HCC)   . Fibromyalgia   . Genetic defect   . GERD (gastroesophageal reflux disease)   . History of IBS   . 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 Hypertension   . PE (pulmonary thromboembolism) (HCC)   . Positive TB test   . Raynaud disease   . Scoliosis   . Urine incontinence     Family History  Problem Relation Age of Onset  . Arthritis Mother   . Hypertension Mother   . Mental illness Mother   . Anxiety disorder Mother   . Depression Mother   . Arthritis Father   . Hypertension Father    . Mental illness Father   . Anxiety disorder Father   . Depression Father   . Mental illness Brother   . ADD / ADHD Brother   . Psychosis Brother   . Post-traumatic stress disorder Brother   . Arthritis Daughter   . Autism Daughter   . Cancer Maternal Aunt   . Autism Maternal Aunt   . Hypertension Maternal Grandmother   . Arthritis Maternal Grandfather   . Depression Cousin   . Anxiety disorder Cousin   . Depression Maternal Aunt     Social History   Socioeconomic History  . Marital status: Divorced    Spouse name: Not  on file  . Number of children: Not on file  . Years of education: Not on file  . Highest education level: Not on file  Occupational History  . Not on file  Social Needs  . Financial resource strain: Not on file  . Food insecurity:    Worry: Not on file    Inability: Not on file  . Transportation needs:    Medical: Not on file    Non-medical: Not on file  Tobacco Use  . Smoking status: Never Smoker  . Smokeless tobacco: Never Used  Substance and Sexual Activity  . Alcohol use: Yes    Alcohol/week: 0.0 standard drinks    Comment: occasional   . Drug use: No  . Sexual activity: Not on file  Lifestyle  . Physical activity:    Days per week: Not on file    Minutes per session: Not on file  . Stress: Not on file  Relationships  . Social connections:    Talks on phone: Not on file    Gets together: Not on file    Attends religious service: Not on file    Active member of club or organization: Not on file    Attends meetings of clubs or organizations: Not on file    Relationship status: Not on file  . Intimate partner violence:    Fear of current or ex partner: Not on file    Emotionally abused: Not on file    Physically abused: Not on file    Forced sexual activity: Not on file  Other Topics Concern  . 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   Please see review of systems for further details on the patient's review from today.   Objective:   Physical Exam:  BP 121/88   Pulse 76   Physical Exam  Constitutional: She is oriented to person, place, and time. She appears well-developed. No distress.  Musculoskeletal: She exhibits no deformity.  Neurological: She is alert and oriented to person, place, and time. Coordination normal.  Psychiatric: Her speech is normal and behavior is normal. Judgment and thought content normal. Her affect is not angry, not blunt, not labile and not inappropriate. Cognition and memory are normal. She exhibits a depressed mood. She expresses no homicidal and no suicidal ideation. She expresses no suicidal plans and no homicidal plans.  Mildly anxious on exam.  Insight intact. No auditory or visual hallucinations. No delusions.     Lab Review:     Component Value Date/Time   NA 137 09/06/2017 0750   NA 134 (L) 01/05/2017 1359   K 4.5 09/06/2017 0750   K 3.9 01/05/2017 1359   CL 100 09/06/2017 0750   CO2 29 09/06/2017 0750   CO2 25 01/05/2017 1359   GLUCOSE 81 09/06/2017 0750   GLUCOSE 106 01/05/2017 1359   BUN 17 09/06/2017 0750   BUN 10.2 01/05/2017 1359   CREATININE 0.85 09/06/2017 0750   CREATININE 0.8 01/05/2017 1359   CALCIUM 9.7 09/06/2017 0750   CALCIUM 9.5 01/05/2017 1359   PROT 6.4 01/05/2017 1359   ALBUMIN 4.2 01/05/2017 1359   AST 18 01/05/2017 1359   ALT 15 01/05/2017 1359   ALKPHOS 60 01/05/2017 1359   BILITOT 0.22 01/05/2017 1359   GFRNONAA >60 12/04/2016 1548  GFRAA >60 12/04/2016 1548       Component Value Date/Time   WBC 5.2 09/06/2017 0750   RBC 4.29 09/06/2017 0750   HGB 13.9 09/06/2017 0750   HGB 13.4 01/05/2017 1359   HCT 41.1 09/06/2017 0750   HCT 39.0 01/05/2017 1359   PLT 287.0 09/06/2017 0750   PLT 274 01/05/2017 1359   MCV 95.9  09/06/2017 0750   MCV 97.5 01/05/2017 1359   MCH 33.5 01/05/2017 1359   MCH 33.4 12/04/2016 1548   MCHC 33.8 09/06/2017 0750   RDW 13.4 09/06/2017 0750   RDW 12.4 01/05/2017 1359   LYMPHSABS 3.0 01/05/2017 1359   MONOABS 0.6 01/05/2017 1359   EOSABS 0.1 01/05/2017 1359   BASOSABS 0.0 01/05/2017 1359    No results found for: POCLITH, LITHIUM   No results found for: PHENYTOIN, PHENOBARB, VALPROATE, CBMZ   .res Assessment: Plan:   Patient seen for 45 minutes and greater than 50% of visit spent counseling patient and coordination of care to include reviewing recent changes in medical providers and medical treatment, and patient working with medical providers to change doses administration times of medications that could be potentially sedating.  Discussed recent significant life benefits and how this has impacted her mood and anxiety and provided validation.  Discussed potential benefits, risk, and side effects of Wellbutrin XL since this may be helpful for depressed mood, low energy, and low motivation, particularly since patient reports responding well to Wellbutrin XL in the past.  Patient agrees to retrial of Wellbutrin XL.  Will start Wellbutrin XL 150 mg in the morning for depressive signs and symptoms.  Will continue sertraline 200 mg daily for mood and anxiety.  Will continue Xanax for insomnia and anxiety and discussed long-term plan to possibly continue to reduce dosage.  Will continue Silenor 6 mg at bedtime for insomnia.  Provided patient with co-pay savings card and advised patient to contact office if she would like for prescription sent to [facility] mail order pharmacy. Will continue Deplin for augmentation of depression and since pt is homozygous for MTHFR mutation.  Generalized anxiety disorder - Plan: busPIRone (BUSPAR) 30 MG tablet, ALPRAZolam (XANAX) 0.5 MG tablet, sertraline (ZOLOFT) 100 MG tablet  MDD (recurrent major depressive disorder) - Plan: buPROPion (WELLBUTRIN XL) 150 MG  24 hr tablet, sertraline (ZOLOFT) 100 MG tablet  Primary insomnia - Plan: SILENOR 6 MG TABS  Please see After Visit Summary for patient specific instructions.  Future Appointments  Date Time Provider Department Center  06/24/2018  3:15 PM Kirsteins, Victorino Sparrow, MD [id number] None  08/13/2018  4:45 PM Corie Chiquito, PMHNP [facility] None    No orders of the defined types were placed in this encounter.     -------------------------------

## 2018-04-17 ENCOUNTER — Other Ambulatory Visit: Payer: Self-pay | Admitting: Family Medicine

## 2018-04-17 ENCOUNTER — Other Ambulatory Visit: Payer: Self-pay | Admitting: Psychiatry

## 2018-04-17 DIAGNOSIS — F411 Generalized anxiety disorder: Secondary | ICD-10-CM

## 2018-04-17 MED ORDER — ALPRAZOLAM 0.5 MG PO TABS: ORAL_TABLET | ORAL | 1 refills | Status: DC

## 2018-04-17 NOTE — Progress Notes (Signed)
Refill xanax as pharmacy stating that Meredith Staggersarey Cottle as supervisor did not show up on prescription information on there and.  So gave 1 month +1 refill

## 2018-04-19 ENCOUNTER — Other Ambulatory Visit: Payer: Self-pay | Admitting: Family Medicine

## 2018-04-23 ENCOUNTER — Other Ambulatory Visit: Payer: Self-pay | Admitting: Family Medicine

## 2018-05-06 MED ORDER — PREGABALIN 50 MG PO CAPS: ORAL_CAPSULE | ORAL | 5 refills | Status: DC

## 2018-05-06 NOTE — Addendum Note (Signed)
Addended by: Erick ColaceKIRSTEINS, Dashawn Golda E on: 05/06/2018 04:35 PM   Modules accepted: Orders

## 2018-05-12 ENCOUNTER — Other Ambulatory Visit: Payer: Self-pay | Admitting: Family Medicine

## 2018-05-17 ENCOUNTER — Telehealth: Payer: Self-pay | Admitting: Psychiatry

## 2018-05-17 ENCOUNTER — Encounter: Payer: Self-pay | Admitting: Psychiatry

## 2018-05-17 ENCOUNTER — Ambulatory Visit (INDEPENDENT_AMBULATORY_CARE_PROVIDER_SITE_OTHER): Payer: PRIVATE HEALTH INSURANCE | Admitting: Psychiatry

## 2018-05-17 ENCOUNTER — Telehealth: Payer: Self-pay | Admitting: Psychology

## 2018-05-17 VITALS — BP 164/98 | HR 94

## 2018-05-17 DIAGNOSIS — F411 Generalized anxiety disorder: Secondary | ICD-10-CM

## 2018-05-17 DIAGNOSIS — F3340 Major depressive disorder, recurrent, in remission, unspecified: Secondary | ICD-10-CM

## 2018-05-17 DIAGNOSIS — F5101 Primary insomnia: Secondary | ICD-10-CM

## 2018-05-17 MED ORDER — MIRTAZAPINE 15 MG PO TABS: ORAL_TABLET | ORAL | 1 refills | Status: DC

## 2018-05-17 NOTE — Telephone Encounter (Signed)
Ptn called again regarding lab charge.  States I never returned call.  I advised she was swabbed due to time of day.  Advised any patient on contract is subject to and reqd to screen perodically based on RX - her timing is end of day and urine screen not done due to lab closing time.  She is not happy that there is a charge , the cost of the charge, and why she is reqd.  I explain [employer] contract requirements and I cannot affect cost as we do not perform.  I advised [facility] does accept payment arrangements if her insurance company did not pay in full.,

## 2018-05-17 NOTE — Telephone Encounter (Signed)
Pt. Called and said that she is going to stop taking the wellbutrin. Pt. Has had a hard time sleeping and doesn't feel like it is helping.Please call her back with any medicine suggestions. [phone]

## 2018-05-17 NOTE — Progress Notes (Signed)
Faith Thomas [id number] [birth year] 57 y.o.  Subjective:   Patient ID:  Faith Thomas is a 57 y.o. (DOB [birth year]) female.  Chief Complaint:  Chief Complaint  Patient presents with  . Anxiety  . Insomnia    HPI Faith Thomas presents to the office today for emergent visit for worsening depression, anxiety, and insomnia. She reports that she has been having significant difficulty falling asleep and has been having to take more Xanax and/or drink wine. Reports difficulty falling and staying asleep. Reports that she increased some supplements to try to sleep and this resulted in excessive grogginess the following day. Now having anticipatory anxiety around falling asleep. Reports that she has been having increased dreams.   Reports she has had increased IBS s/s. Reports that she is having increased anxiety. Denies any panic attacks. Some worry. Denies depressed mood. Motivation is lower due to physical issues and pain. Energy has been low with decreased sleep. Concentration is "not bad." Reports decreased appetite due to frequent mild nausea. Denies SI or passive death wishes.  Reports that she has been having significant family stressors.   Continues to see therapist weekly.   Past Medication Trials: Buspar Trintellix- memory difficulties Sertraline Cymbalta- memory difficulties Prozac-stomach pain  Paxil Celexa Lexapro Wellbutrin XL Lamictal- ineffective Klonopin Xanax Doxepin Trazodone- stopped working [place]in  Review of Systems:  Review of Systems  Gastrointestinal: Positive for abdominal pain, constipation and nausea.  Musculoskeletal: Positive for myalgias. Negative for gait problem.  Neurological: Positive for headaches. Negative for tremors.  Psychiatric/Behavioral:       Please refer to HPI    Medications: I have reviewed the patient's current medications.  Current Outpatient Medications  Medication Sig Dispense Refill  . acetaminophen  (TYLENOL) 500 MG tablet Take 1,000 mg by mouth every 8 (eight) hours as needed.     . ALPRAZolam (XANAX) 0.5 MG tablet Take 1 tab po QHS and 1 tab po qd prn anxiety 60 tablet 1  . apixaban (ELIQUIS) 5 MG TABS tablet Take 1 tablet (5 mg total) by mouth 2 (two) times daily. 180 tablet 1  . b complex vitamins capsule Take 1 capsule by mouth daily.    Marland Kitchen buPROPion (WELLBUTRIN XL) 150 MG 24 hr tablet Take 1 tablet (150 mg total) by mouth daily. 90 tablet 1  . busPIRone (BUSPAR) 30 MG tablet Take 1 tablet (30 mg total) by mouth 2 (two) times daily. 180 tablet 1  . Cholecalciferol (VITAMIN D-3) 1000 units CAPS Take 10,000 Units by mouth daily.     Marland Kitchen ELIQUIS 5 MG TABS tablet TAKE 1 TABLET (5 MG TOTAL) BY MOUTH 2 (TWO) TIMES DAILY WITH A MEAL. 60 tablet 0  . L-Methylfolate-Algae (DEPLIN 15 PO) Take by mouth.    Marland Kitchen L-Methylfolate-Algae (DEPLIN 15) 15-90.314 MG CAPS Take 1 capsule by mouth daily.  3  . lisinopril (PRINIVIL,ZESTRIL) 10 MG tablet TAKE 1 TABLET BY MOUTH EVERY DAY 34 tablet 2  . Methylcobalamin (B-12) 5000 MCG TBDP Inject 0.1 mLs into the muscle once a week.     . mometasone (NASONEX) 50 MCG/ACT nasal spray USE ONE SPRAY IN EACH NOSTRIL ONE TIME DAILY 17 g 5  . Omega 3 1200 MG CAPS Take 1,200 mg by mouth daily.    . pregabalin (LYRICA) 50 MG capsule TAKE 1 CAPSULE BY MOUTH THREE TIMES A DAY 90 capsule 5  . Probiotic Product (PROBIOTIC PO) Take 1 capsule by mouth daily.     . sertraline (ZOLOFT) 100 MG  tablet Take 2 tablets (200 mg total) by mouth at bedtime. 180 tablet 1  . SILENOR 6 MG TABS Take 1 tablet (6 mg total) by mouth at bedtime. 90 tablet 1  . traMADol (ULTRAM) 50 MG tablet TAKE 1 TABLET BY MOUTH TWICE A DAY AS NEEDED FOR MODERATE PAIN 60 tablet 2  . vitamin C (ASCORBIC ACID) 500 MG tablet Take 1,000 mg by mouth daily.     . cyclobenzaprine (FLEXERIL) 5 MG tablet Take 1 tablet (5 mg total) by mouth 2 (two) times daily. (Patient not taking: Reported on 05/17/2018) 60 tablet 3  .  mirtazapine (REMERON) 15 MG tablet Take 1/2-1 po QHS 30 tablet 1   No current facility-administered medications for this visit.     Medication Side Effects: None  Allergies:  Allergies  Allergen Reactions  . Citalopram Tinitus  . Escitalopram Tinitus  . Bee Venom Swelling  . Latex Swelling  . Penicillins Swelling and Rash    Has patient had a PCN reaction causing immediate rash, facial/tongue/throat swelling, SOB or lightheadedness with hypotension: Yes Has patient had a PCN reaction causing severe rash involving mucus membranes or skin necrosis: No Has patient had a PCN reaction that required hospitalization: No Has patient had a PCN reaction occurring within the last 10 years: No If all of the above answers are "NO", then may proceed with Cephalosporin use.  Marland Kitchen Cymbalta [Duloxetine Hcl]     -memory loss  . Escitalopram Oxalate Tinitus  . Other     Yeast creams- causes secondary infections, itching, burning, redness   . Progesterone     Pt feels caused weakness, fatigue, cognitive deficits  . Trintellix [Vortioxetine]     Memory problems  . Duloxetine Other (See Comments)    Past Medical History:  Diagnosis Date  . Allergy   . Depression   . DVT (deep venous thrombosis) (HCC)   . Fibromyalgia   . Genetic defect   . GERD (gastroesophageal reflux disease)   . History of IBS   . 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 Hypertension   . PE (pulmonary thromboembolism) (HCC)   . Positive TB test   . Raynaud disease   . Scoliosis   . Urine incontinence     Family History  Problem Relation Age of Onset  . Arthritis Mother   . Hypertension Mother   . Mental illness Mother   . Anxiety disorder Mother   . Depression Mother   . Arthritis Father   . Hypertension Father   . Mental illness Father   . Anxiety disorder Father   . Depression Father   . Mental illness Brother   . ADD / ADHD Brother   . Psychosis Brother   . Post-traumatic stress disorder  Brother   . Arthritis Daughter   . Autism Daughter   . Cancer Maternal Aunt   . Autism Maternal Aunt   . Hypertension Maternal Grandmother   . Arthritis Maternal Grandfather   . Depression Cousin   . Anxiety disorder Cousin   . Depression Maternal Aunt     Social History   Socioeconomic History  . Marital status: Divorced    Spouse name: Not on file  . Number of children: Not on file  . Years of education: Not on file  . Highest education level: Not on file  Occupational History  . Not on file  Social Needs  . Financial resource strain: Not on file  . Food insecurity:  Worry: Not on file    Inability: Not on file  . Transportation needs:    Medical: Not on file    Non-medical: Not on file  Tobacco Use  . Smoking status: Never Smoker  . Smokeless tobacco: Never Used  Substance and Sexual Activity  . Alcohol use: Yes    Alcohol/week: 0.0 standard drinks    Comment: occasional   . Drug use: No  . Sexual activity: Not on file  Lifestyle  . Physical activity:    Days per week: Not on file    Minutes per session: Not on file  . Stress: Not on file  Relationships  . Social connections:    Talks on phone: Not on file    Gets together: Not on file    Attends religious service: Not on file    Active member of club or organization: Not on file    Attends meetings of clubs or organizations: Not on file    Relationship status: Not on file  . Intimate partner violence:    Fear of current or ex partner: Not on file    Emotionally abused: Not on file    Physically abused: Not on file    Forced sexual activity: Not on file  Other Topics Concern  . 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   Please see review of systems for further details on the patient's review from  today.   Objective:   Physical Exam:  BP (!) 164/98   Pulse 94   Physical Exam Constitutional:      General: She is not in acute distress.    Appearance: She is well-developed.  Musculoskeletal:        General: No deformity.  Neurological:     Mental Status: She is alert and oriented to person, place, and time.     Coordination: Coordination normal.  Psychiatric:        Mood and Affect: Mood is anxious and depressed. Affect is tearful. Affect is not labile, blunt, angry or inappropriate.        Speech: Speech normal.        Behavior: Behavior normal.        Thought Content: Thought content normal. Thought content does not include homicidal or suicidal ideation. Thought content does not include homicidal or suicidal plan.        Judgment: Judgment normal.     Comments: Insight intact. No auditory or visual hallucinations. No delusions.      Lab Review:     Component Value Date/Time   NA 137 09/06/2017 0750   NA 134 (L) 01/05/2017 1359   K 4.5 09/06/2017 0750   K 3.9 01/05/2017 1359   CL 100 09/06/2017 0750   CO2 29 09/06/2017 0750   CO2 25 01/05/2017 1359   GLUCOSE 81 09/06/2017 0750   GLUCOSE 106 01/05/2017 1359   BUN 17 09/06/2017 0750   BUN 10.2 01/05/2017 1359   CREATININE 0.85 09/06/2017 0750   CREATININE 0.8 01/05/2017 1359   CALCIUM 9.7 09/06/2017 0750   CALCIUM 9.5 01/05/2017 1359   PROT 6.4 01/05/2017 1359   ALBUMIN 4.2 01/05/2017 1359   AST 18 01/05/2017 1359   ALT 15 01/05/2017 1359   ALKPHOS 60 01/05/2017 1359   BILITOT 0.22 01/05/2017 1359  GFRNONAA >60 12/04/2016 1548   GFRAA >60 12/04/2016 1548       Component Value Date/Time   WBC 5.2 09/06/2017 0750   RBC 4.29 09/06/2017 0750   HGB 13.9 09/06/2017 0750   HGB 13.4 01/05/2017 1359   HCT 41.1 09/06/2017 0750   HCT 39.0 01/05/2017 1359   PLT 287.0 09/06/2017 0750   PLT 274 01/05/2017 1359   MCV 95.9 09/06/2017 0750   MCV 97.5 01/05/2017 1359   MCH 33.5 01/05/2017 1359   MCH 33.4  12/04/2016 1548   MCHC 33.8 09/06/2017 0750   RDW 13.4 09/06/2017 0750   RDW 12.4 01/05/2017 1359   LYMPHSABS 3.0 01/05/2017 1359   MONOABS 0.6 01/05/2017 1359   EOSABS 0.1 01/05/2017 1359   BASOSABS 0.0 01/05/2017 1359    No results found for: POCLITH, LITHIUM   No results found for: PHENYTOIN, PHENOBARB, VALPROATE, CBMZ   .res Assessment: Plan:   Will stop Wellbutrin XL since this may be causing increased anxiety and/or sleep disturbance. Discussed potential benefits, risk, and side effects of Remeron and that it may be helpful for insomnia, anxiety, and recent nausea and GI signs and symptoms thought to be possibly related to anxiety.  Discussed that Remeron could be taken short-term once sleep and anxiety improve, particularly if weight gain occurs.  Start Remeron 15 mg 1/2-1 tab p.o. nightly for insomnia, anxiety, and mood. Discussed that it may be helpful to retry trazodone if unable to tolerate Remeron since this was effective in the past and then stopped working.  Discussed that trazodone may now be effective again and she may want to periodically alternate between medications for sleep. Continue BuSpar 30 mg twice daily for insomnia. Continue alprazolam as needed for anxiety.  Primary insomnia - Plan: mirtazapine (REMERON) 15 MG tablet  Generalized anxiety disorder  MDD (recurrent major depressive disorder) - Plan: mirtazapine (REMERON) 15 MG tablet  Please see After Visit Summary for patient specific instructions.  Future Appointments  Date Time Provider Department Center  06/20/2018 10:30 AM Corie Chiquito, PMHNP [facility] None  06/20/2018  1:30 PM Kirsteins, Victorino Sparrow, MD [id number] None  08/13/2018  4:45 PM Corie Chiquito, PMHNP [facility] None    No orders of the defined types were placed in this encounter.     -------------------------------

## 2018-05-21 ENCOUNTER — Telehealth: Payer: Self-pay | Admitting: Hematology

## 2018-05-21 NOTE — Telephone Encounter (Signed)
Patient left message requesting appointment with Dr. Candise CheKale re medication change. Patient last see August 2018. This message routed to [facility]/desk re patient request. Spoke with patient she is aware.

## 2018-05-22 NOTE — Telephone Encounter (Signed)
Contacted patient - She wants to stop tramadol and change to ibuprofen for pain and wants to make sure it doesn't affect her blood coagulation.  Per her records, Dr. Candise CheKale does not currently prescribe any medications for her. At her last OV with him January 03, 2017, his notes stated: "Would need to follow-up with her primary care physician for continued refills on her anticoagulation and for lab monitoring with regards to renal and liver functions every 6 months". Advised patient to contact primary care for medication coordination. Patient verbalized understanding and thanked caller.

## 2018-06-07 IMAGING — MR MR THORACIC SPINE W/O CM
4 of 11 series · 12 of 48 positions shown · non-contrast
Comparison: Subsequent chest CTA 10/26/2016.

ADDENDUM:
Correction, the yoga injury stated in the clinical data occurred
YEARS ago.

This Cervical and Thoracic spine MRI was also reviewed by [HOSPITAL]
Neuroradiologist Dr. Abraham Alejandro Argel, who concurs with my report.
I discussed this exam with the patient by telephone on 11/07/2016 at
3161 hours.
She advised that an additional concern is a chronic palpable
abnormality in her right neck, which had been clinically suspected
to be a bony prominence or alternatively an abnormal lymph node by
different Healthcare providers.
Axial images on this study from the mid C2 level inferiorly
demonstrate no abnormal lymph nodes and no neck soft tissue mass on
either side.
On the right side of the neck there is pronounced bony spurring of
the chronically degenerated right C3-C4 facet (seen best on series
5, image 9) and based on our discussion I suspect this corresponds
to the palpable area of concern.
CLINICAL DATA: The studies were identified as missing a report at
55-year-old female with cervical and thoracic back pain, chronic and
left-sided but acutely progressed during yoga 3 months ago.
Additional clinical information of: Pulmonary Embolus diagnosed
10/26/2016.
EXAM:
MRI CERVICAL, AND THORACIC SPINE WITHOUT CONTRAST
TECHNIQUE: Multiplanar and multiecho pulse sequences of the cervical spine and
thoracic spine were obtained without intravenous contrast.

[Series 2: T2 · sagittal · 3.0mm · 0.41mm/px · 2 of 12 slices shown (1 of 4)]
[im 1/12]
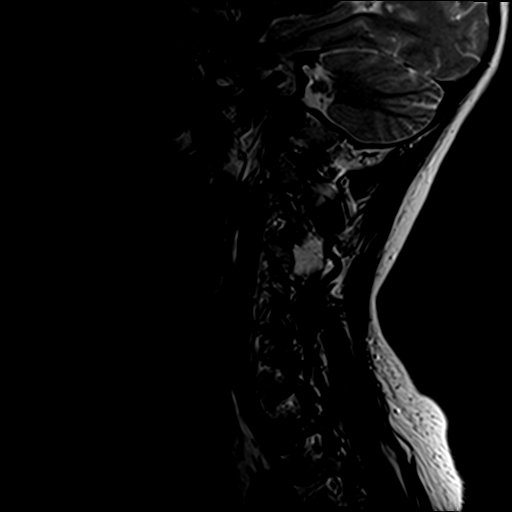
[im 12/12]
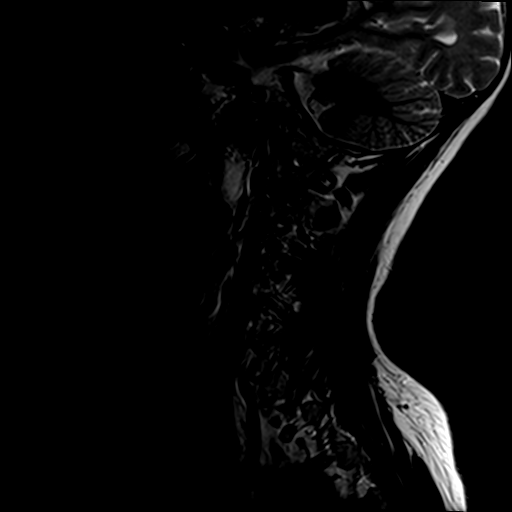

[Series 5: T2 · axial · 3.0mm · 0.39mm/px · z∈[-56,+35]mm · 4 of 26 slices shown (2 of 4)]
[im 1/26]
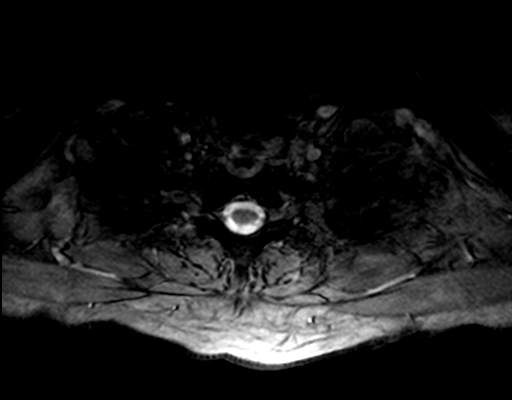
[im 6/26]
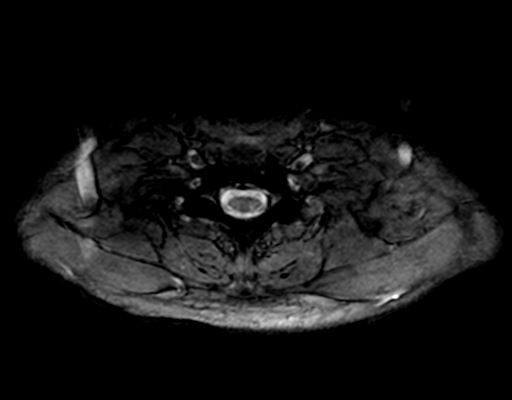
[im 16/26]
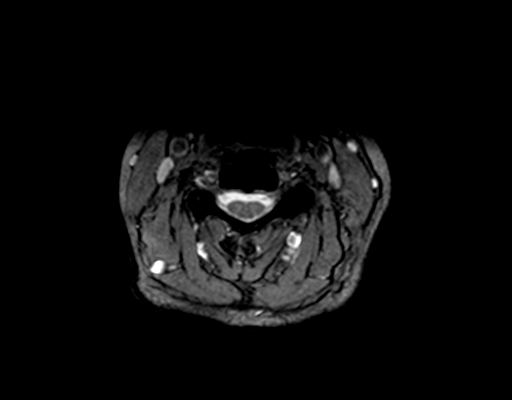
[im 26/26]
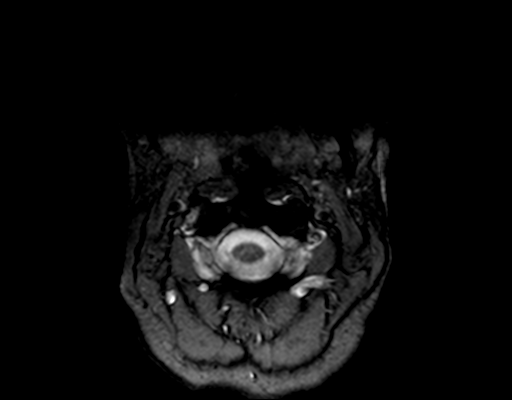

[Series 6: T2 · axial · 3.0mm · 0.39mm/px · z∈[-38,+35]mm · 3 of 26 slices shown (3 of 4)]
[im 6/26]
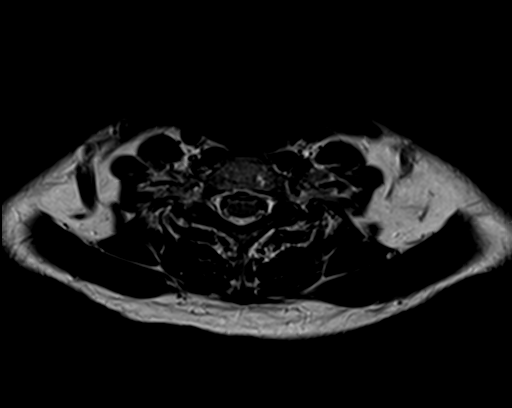
[im 16/26]
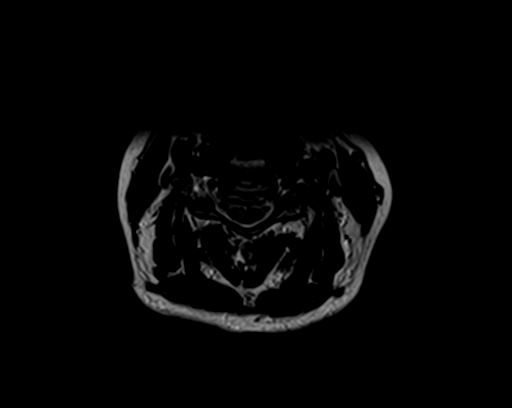
[im 26/26]
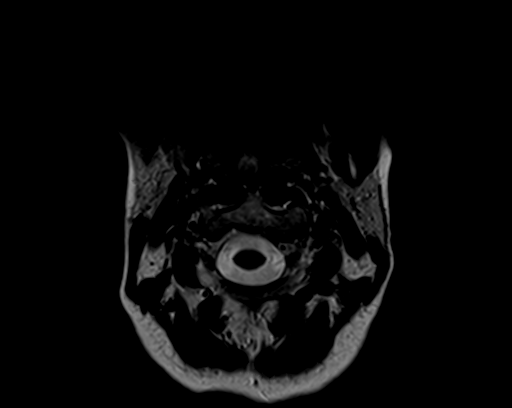

[Series 10: T2 · sagittal · 4.0mm · 0.36mm/px · 3 of 13 slices shown (4 of 4)]
[im 1/13]
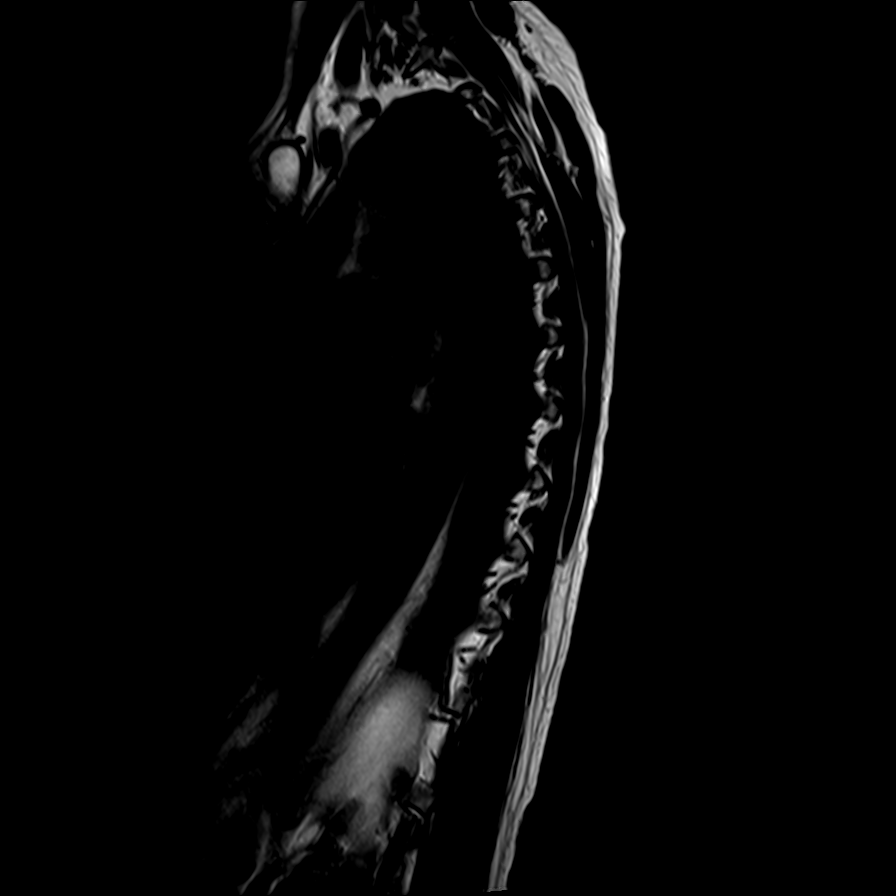
[im 7/13]
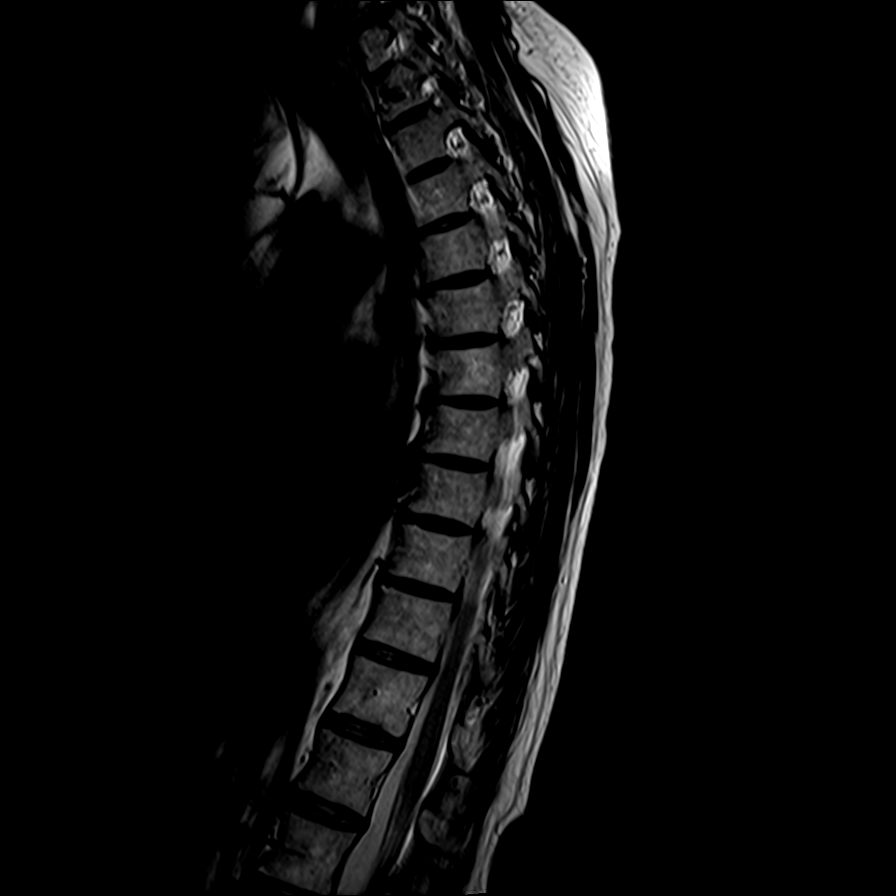
[im 13/13]
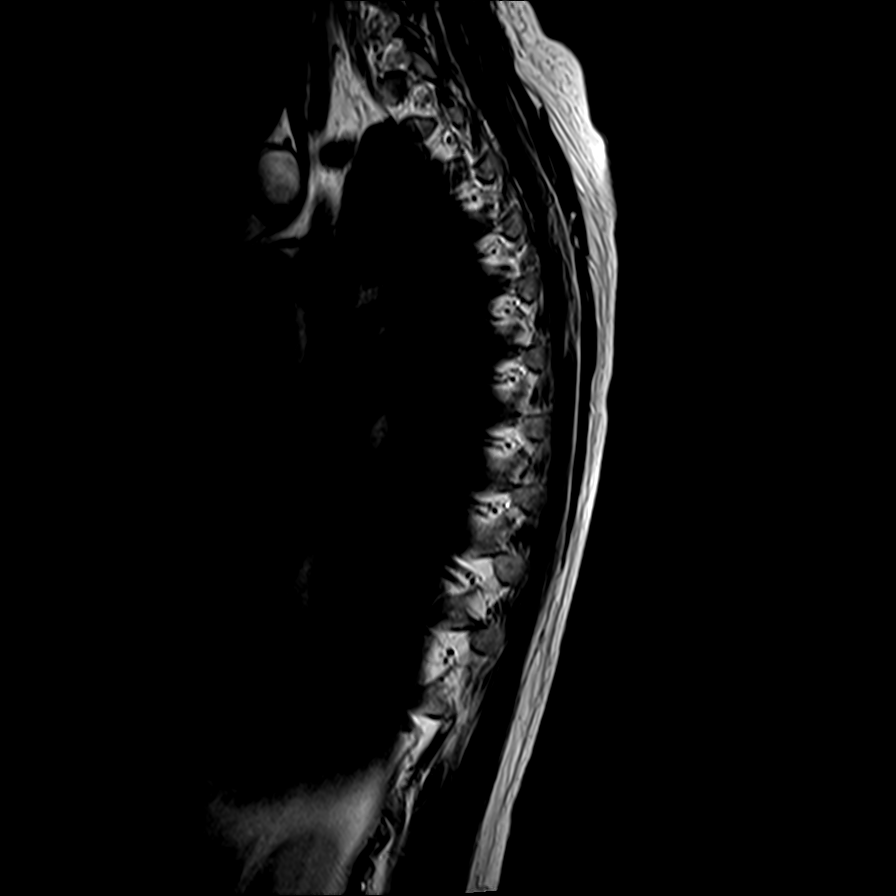

[12 of 48 positions shown; findings below may reference images not displayed]

Subsequent [HOSPITAL]
neurosurgery cervical spine radiographs 10/24/2016. Thoracic spine
radiographs 04/16/2015
FINDINGS: MRI CERVICAL SPINE FINDINGS

Alignment: Stable compared to the cervical spine radiographs with
mild anterolisthesis of C4 on C5 and straightening of lower cervical
lordosis.

Vertebrae: No marrow edema or evidence of acute osseous abnormality.

Cord: Spinal cord signal is within normal limits at all visualized
levels.

Posterior Fossa, vertebral arteries, paraspinal tissues: Negative
visualized brain parenchyma. Major vascular flow voids in the neck
are preserved. Negative neck soft tissues.

Disc levels:

C2-C3: Right foraminal disc osteophyte complex. Mild to moderate
right C3 foraminal stenosis (series 6, image 3).

C3-C4: Mild-to-moderate facet hypertrophy greater on the right. Mild
right greater than left endplate spurring. Mild disc bulge. Mild if
any bilateral C4 foraminal stenosis mild anterolisthesis.

C4-C5: Mild broad-based posterior disc bulge or protrusion. Moderate
facet hypertrophy worse on the left. Mild endplate spurring. Mild
ligament flavum hypertrophy greater on the left. Borderline to mild
spinal stenosis. Mild to moderate left C5 foraminal stenosis disc
space loss. Posterior disc osteophyte.

C5-C6: Lobulated posterior disc osteophyte complex greater on the
left. Bilateral foraminal involvement. Mild ligament flavum
hypertrophy. Mild facet hypertrophy. Spinal stenosis with mild if
any spinal cord mass effect. Severe left and moderate to severe
right C6 foraminal stenosis.

C6-C7: Posterior disc space loss. Broad-based posterior disc
osteophyte complex. Mild ligament flavum hypertrophy. Spinal
stenosis with mild if any spinal cord mass effect. Moderate left and
mild right C7 foraminal stenosis.

C7-T1:  Negative.

MRI THORACIC SPINE FINDINGS

Segmentation:  Appears normal.

Alignment: Normal thoracic vertebral height and alignment. There is
partially visible levoconvex scoliosis with a rotatory component at
the thoracolumbar junction (series 13, image 39).

Vertebrae: No marrow edema or evidence of acute osseous abnormality.
Visualized bone marrow signal is within normal limits.

Cord: Spinal cord signal is within normal limits at all visualized
levels. Fairly capacious thoracic spinal canal. The conus medullaris
appears normal at T12.

Paraspinal and other soft tissues: Negative.

Disc levels:

T1-T2: Mild facet and left uncovertebral hypertrophy.  No stenosis.

T2-T3: Negative.

T3-T4: Borderline to mild facet hypertrophy.

T4-T5: Borderline to mild facet hypertrophy.

T5-T6: Negative.

T6-T7: Small left subarticular disc protrusion (series 13, image
20). No stenosis.

T7-T8: Small right paracentral broad-based disc protrusion (series
13, image 23). No stenosis.

T8-T9: Small lobulated paracentral disc protrusions (series 13,
image 26). Mild facet hypertrophy. No stenosis.

T9-T10: Small right paracentral disc protrusion (series 13, image
29). Mild right facet hypertrophy. No stenosis.

T10-T11: Mild facet hypertrophy.

T11-T12: Negative.

T12-L1:  Negative.
IMPRESSION: 1. No acute osseous abnormality in the cervical or thoracic spine.
Mild cervical spondylolisthesis at C4-C5.
2. Cervical disc, endplate and facet degeneration combining for mild
spinal stenosis from C4-C5 to C6-C7. Mild if any associated spinal
cord mass effect and no cord signal abnormality.
3. Multifactorial degenerative cervical foraminal stenosis is
moderate or severe at the left C5, left greater than right C6, and
left C7 nerve levels.
4. Intermittent small thoracic disc herniations and mild thoracic
facet hypertrophy without thoracic spinal stenosis or neural
impingement.

## 2018-06-18 ENCOUNTER — Other Ambulatory Visit: Payer: Self-pay | Admitting: Psychiatry

## 2018-06-18 DIAGNOSIS — F3340 Major depressive disorder, recurrent, in remission, unspecified: Secondary | ICD-10-CM

## 2018-06-20 ENCOUNTER — Encounter: Payer: PRIVATE HEALTH INSURANCE | Attending: Physical Medicine & Rehabilitation

## 2018-06-20 ENCOUNTER — Encounter: Payer: Self-pay | Admitting: Psychiatry

## 2018-06-20 ENCOUNTER — Ambulatory Visit: Payer: PRIVATE HEALTH INSURANCE | Admitting: Psychiatry

## 2018-06-20 ENCOUNTER — Encounter: Payer: Self-pay | Admitting: Physical Medicine & Rehabilitation

## 2018-06-20 ENCOUNTER — Ambulatory Visit (HOSPITAL_BASED_OUTPATIENT_CLINIC_OR_DEPARTMENT_OTHER): Payer: PRIVATE HEALTH INSURANCE | Admitting: Physical Medicine & Rehabilitation

## 2018-06-20 VITALS — BP 119/84 | HR 71 | Ht 65.0 in | Wt 160.0 lb

## 2018-06-20 VITALS — BP 128/96 | HR 71

## 2018-06-20 DIAGNOSIS — Z86711 Personal history of pulmonary embolism: Secondary | ICD-10-CM | POA: Diagnosis not present

## 2018-06-20 DIAGNOSIS — F411 Generalized anxiety disorder: Secondary | ICD-10-CM | POA: Diagnosis not present

## 2018-06-20 DIAGNOSIS — F329 Major depressive disorder, single episode, unspecified: Secondary | ICD-10-CM | POA: Diagnosis not present

## 2018-06-20 DIAGNOSIS — M797 Fibromyalgia: Secondary | ICD-10-CM | POA: Insufficient documentation

## 2018-06-20 DIAGNOSIS — I10 Essential (primary) hypertension: Secondary | ICD-10-CM | POA: Diagnosis not present

## 2018-06-20 DIAGNOSIS — K219 Gastro-esophageal reflux disease without esophagitis: Secondary | ICD-10-CM | POA: Insufficient documentation

## 2018-06-20 DIAGNOSIS — M47816 Spondylosis without myelopathy or radiculopathy, lumbar region: Secondary | ICD-10-CM | POA: Diagnosis not present

## 2018-06-20 DIAGNOSIS — Z1589 Genetic susceptibility to other disease: Secondary | ICD-10-CM

## 2018-06-20 DIAGNOSIS — M4186 Other forms of scoliosis, lumbar region: Secondary | ICD-10-CM | POA: Diagnosis not present

## 2018-06-20 DIAGNOSIS — Z86718 Personal history of other venous thrombosis and embolism: Secondary | ICD-10-CM | POA: Insufficient documentation

## 2018-06-20 DIAGNOSIS — F3340 Major depressive disorder, recurrent, in remission, unspecified: Secondary | ICD-10-CM | POA: Diagnosis not present

## 2018-06-20 DIAGNOSIS — E7212 Methylenetetrahydrofolate reductase deficiency: Secondary | ICD-10-CM

## 2018-06-20 DIAGNOSIS — M67952 Unspecified disorder of synovium and tendon, left thigh: Secondary | ICD-10-CM

## 2018-06-20 DIAGNOSIS — F5101 Primary insomnia: Secondary | ICD-10-CM

## 2018-06-20 MED ORDER — ALPRAZOLAM 0.5 MG PO TABS: ORAL_TABLET | ORAL | 2 refills | Status: DC

## 2018-06-20 MED ORDER — MIRTAZAPINE 15 MG PO TABS: 15.0000 mg | ORAL_TABLET | Freq: Every day | ORAL | 1 refills | Status: DC

## 2018-06-20 MED ORDER — DEPLIN 15 15-90.314 MG PO CAPS: 1.0000 | ORAL_CAPSULE | Freq: Every day | ORAL | 3 refills | Status: DC

## 2018-06-20 NOTE — Progress Notes (Signed)
Faith Thomas [id number] 1960-12-27 58 y.o.  Subjective:   Patient ID:  Faith Thomas is a 58 y.o. (DOB 10-Dec-1960) female.  Chief Complaint:  Chief Complaint  Patient presents with  . Follow-up    Depression, Anxiety, Insomnina    HPI Faith Thomas presents to the office today for follow-up of depression anxiety, and insomnia. She reports that her energy has been improved, is more goal oriented and feeling "more optimistic." She reports that she is sleeping better and not needing Xanax prn most nights to improve sleep. "It so much easier to move my body now." She reports that she has experienced some weight gain and attributes this some to "more happiness." Reports some continued mild sadness and attributes this to "still struggling with some acceptance." She reports she has "a little irritability" on occasion. Reports that she bought a new car and that it was within her budget. Has been making long-term plans for the future. Also re-financed her house. Denies excessive spending. Denies impulsive or risky behavior. Reports feeling more hopeful. Reports that she had some slight anxiety around trading vehicles and was able to manage this. Energy and motivation have been "much better." She reports some improvement in concentration. Reports that she is now taking a lunch break daily and is walking some outside during her lunch break. Denies SI.   Estimates taking Xanax about twice a week. Reports that she is only taking Tramadol prn during the day.  Past Medication Trials: Buspar Trintellix- memory difficulties Sertraline Cymbalta- memory difficulties Prozac-stomach pain  Paxil Celexa Lexapro Wellbutrin XL- Irritability Lamictal- ineffective Klonopin Xanax Doxepin Trazodone- stopped working Deplin  Review of Systems:  Review of Systems  Musculoskeletal: Negative for gait problem.       Reports knows in hip. Reports some improvement in pain overall.    Neurological: Negative for tremors.  Psychiatric/Behavioral:       Please refer to HPI    Medications: I have reviewed the patient's current medications.  Current Outpatient Medications  Medication Sig Dispense Refill  . acetaminophen (TYLENOL) 500 MG tablet Take 1,000 mg by mouth every 8 (eight) hours as needed.     . ALPRAZolam (XANAX) 0.5 MG tablet Take 1 tab po QHS and 1 tab po qd prn anxiety 60 tablet 2  . apixaban (ELIQUIS) 5 MG TABS tablet Take 1 tablet (5 mg total) by mouth 2 (two) times daily. 180 tablet 1  . b complex vitamins capsule Take 1 capsule by mouth daily.    . busPIRone (BUSPAR) 30 MG tablet Take 1 tablet (30 mg total) by mouth 2 (two) times daily. 180 tablet 1  . Cholecalciferol (VITAMIN D-3) 1000 units CAPS Take 10,000 Units by mouth daily.     Marland Kitchen ELIQUIS 5 MG TABS tablet TAKE 1 TABLET (5 MG TOTAL) BY MOUTH 2 (TWO) TIMES DAILY WITH A MEAL. 60 tablet 0  . lisinopril (PRINIVIL,ZESTRIL) 10 MG tablet TAKE 1 TABLET BY MOUTH EVERY DAY 34 tablet 2  . Methylcobalamin (B-12) 5000 MCG TBDP Inject 0.1 mLs into the muscle once a week.     . mirtazapine (REMERON) 15 MG tablet Take 1 tablet (15 mg total) by mouth at bedtime. 90 tablet 1  . mometasone (NASONEX) 50 MCG/ACT nasal spray USE ONE SPRAY IN EACH NOSTRIL ONE TIME DAILY 17 g 5  . Omega 3 1200 MG CAPS Take 1,200 mg by mouth daily.    . pregabalin (LYRICA) 50 MG capsule TAKE 1 CAPSULE BY MOUTH THREE TIMES A  DAY 90 capsule 5  . Probiotic Product (PROBIOTIC PO) Take 1 capsule by mouth daily.     . sertraline (ZOLOFT) 100 MG tablet Take 2 tablets (200 mg total) by mouth at bedtime. 180 tablet 1  . SILENOR 6 MG TABS Take 1 tablet (6 mg total) by mouth at bedtime. 90 tablet 1  . traMADol (ULTRAM) 50 MG tablet TAKE 1 TABLET BY MOUTH TWICE A DAY AS NEEDED FOR MODERATE PAIN 60 tablet 2  . cyclobenzaprine (FLEXERIL) 5 MG tablet Take 1 tablet (5 mg total) by mouth 2 (two) times daily. 60 tablet 3  . L-Methylfolate-Algae (DEPLIN 15)  15-90.314 MG CAPS Take 1 capsule by mouth daily. 30 capsule 3  . vitamin C (ASCORBIC ACID) 500 MG tablet Take 1,000 mg by mouth daily.      No current facility-administered medications for this visit.     Medication Side Effects: None  Allergies:  Allergies  Allergen Reactions  . Citalopram Tinitus  . Escitalopram Tinitus  . Bee Venom Swelling  . Latex Swelling  . Penicillins Swelling and Rash    Has patient had a PCN reaction causing immediate rash, facial/tongue/throat swelling, SOB or lightheadedness with hypotension: Yes Has patient had a PCN reaction causing severe rash involving mucus membranes or skin necrosis: No Has patient had a PCN reaction that required hospitalization: No Has patient had a PCN reaction occurring within the last 10 years: No If all of the above answers are "NO", then may proceed with Cephalosporin use.  Marland Kitchen. Cymbalta [Duloxetine Hcl]     -memory loss  . Escitalopram Oxalate Tinitus  . Other     Yeast creams- causes secondary infections, itching, burning, redness   . Progesterone     Pt feels caused weakness, fatigue, cognitive deficits  . Trintellix [Vortioxetine]     Memory problems  . Bupropion Other (See Comments)    weakness and malaise.  . Duloxetine Other (See Comments)    Past Medical History:  Diagnosis Date  . Allergy   . Depression   . DVT (deep venous thrombosis) (HCC)   . Fibromyalgia   . Genetic defect   . GERD (gastroesophageal reflux disease)   . History of IBS   . 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 Hypertension   . PE (pulmonary thromboembolism) (HCC)   . Positive TB test   . Raynaud disease   . Scoliosis   . Urine incontinence     Family History  Problem Relation Age of Onset  . Arthritis Mother   . Hypertension Mother   . Mental illness Mother   . Anxiety disorder Mother   . Depression Mother   . Arthritis Father   . Hypertension Father   . Mental illness Father   . Anxiety disorder Father    . Depression Father   . Mental illness Brother   . ADD / ADHD Brother   . Psychosis Brother   . Post-traumatic stress disorder Brother   . Arthritis Daughter   . Autism Daughter   . Cancer Maternal Aunt   . Autism Maternal Aunt   . Hypertension Maternal Grandmother   . Arthritis Maternal Grandfather   . Depression Cousin   . Anxiety disorder Cousin   . Depression Maternal Aunt     Social History   Socioeconomic History  . Marital status: Divorced    Spouse name: Not on file  . Number of children: Not on file  . Years of education: Not on file  .  Highest education level: Not on file  Occupational History  . Not on file  Social Needs  . Financial resource strain: Not on file  . Food insecurity:    Worry: Not on file    Inability: Not on file  . Transportation needs:    Medical: Not on file    Non-medical: Not on file  Tobacco Use  . Smoking status: Never Smoker  . Smokeless tobacco: Never Used  Substance and Sexual Activity  . Alcohol use: Yes    Alcohol/week: 0.0 standard drinks    Comment: occasional   . Drug use: No  . Sexual activity: Not on file  Lifestyle  . Physical activity:    Days per week: Not on file    Minutes per session: Not on file  . Stress: Not on file  Relationships  . Social connections:    Talks on phone: Not on file    Gets together: Not on file    Attends religious service: Not on file    Active member of club or organization: Not on file    Attends meetings of clubs or organizations: Not on file    Relationship status: Not on file  . Intimate partner violence:    Fear of current or ex partner: Not on file    Emotionally abused: Not on file    Physically abused: Not on file    Forced sexual activity: Not on file  Other Topics Concern  . 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   Please see review of systems for further details on the patient's review from today.   Objective:   Physical Exam:  BP (!) 128/96   Pulse 71   Physical Exam Constitutional:      General: She is not in acute distress.    Appearance: She is well-developed.  Musculoskeletal:        General: No deformity.  Neurological:     Mental Status: She is alert and oriented to person, place, and time.     Coordination: Coordination normal.  Psychiatric:        Mood and Affect: Mood is not anxious or depressed. Affect is not labile, blunt, angry or inappropriate.        Speech: Speech normal.        Behavior: Behavior normal.        Thought Content: Thought content normal. Thought content does not include homicidal or suicidal ideation. Thought content does not include homicidal or suicidal plan.        Judgment: Judgment normal.     Comments: Insight intact. No auditory or visual hallucinations. No delusions.      Lab Review:     Component Value Date/Time   NA 137 09/06/2017 0750   NA 134 (L) 01/05/2017 1359   K 4.5 09/06/2017 0750   K 3.9 01/05/2017 1359   CL 100 09/06/2017 0750   CO2 29 09/06/2017 0750   CO2 25 01/05/2017 1359   GLUCOSE 81 09/06/2017 0750   GLUCOSE 106 01/05/2017 1359   BUN 17 09/06/2017 0750   BUN 10.2 01/05/2017 1359   CREATININE 0.85 09/06/2017 0750   CREATININE 0.8 01/05/2017 1359   CALCIUM 9.7 09/06/2017 0750   CALCIUM 9.5 01/05/2017 1359   PROT 6.4 01/05/2017 1359  ALBUMIN 4.2 01/05/2017 1359   AST 18 01/05/2017 1359   ALT 15 01/05/2017 1359   ALKPHOS 60 01/05/2017 1359   BILITOT 0.22 01/05/2017 1359   GFRNONAA >60 12/04/2016 1548   GFRAA >60 12/04/2016 1548       Component Value Date/Time   WBC 5.2 09/06/2017 0750   RBC 4.29 09/06/2017 0750   HGB 13.9 09/06/2017 0750   HGB 13.4 01/05/2017 1359   HCT 41.1 09/06/2017 0750   HCT 39.0 01/05/2017 1359   PLT 287.0 09/06/2017 0750   PLT  274 01/05/2017 1359   MCV 95.9 09/06/2017 0750   MCV 97.5 01/05/2017 1359   MCH 33.5 01/05/2017 1359   MCH 33.4 12/04/2016 1548   MCHC 33.8 09/06/2017 0750   RDW 13.4 09/06/2017 0750   RDW 12.4 01/05/2017 1359   LYMPHSABS 3.0 01/05/2017 1359   MONOABS 0.6 01/05/2017 1359   EOSABS 0.1 01/05/2017 1359   BASOSABS 0.0 01/05/2017 1359    No results found for: POCLITH, LITHIUM   No results found for: PHENYTOIN, PHENOBARB, VALPROATE, CBMZ   .res Assessment: Plan:   Continue Remeron 15 mg p.o. nightly since mood, anxiety, and insomnia have significantly improved with Remeron.  Reviewed indications, mechanism of action, and potential side effects with patient in response to patient's questions Will continue BuSpar 30 mg twice daily for anxiety since anxiety is currently well controlled.  Discussed considering possible dose reduction in the future since patient expresses interest in trying to reduce medications in the future after she has maintained a longer period of stability with mood and anxiety signs and symptoms. Will continue alprazolam as needed since patient reports that this remains effective and used infrequently for severe anxiety signs and symptoms. Continue sertraline 200 mg daily since this has been helpful for her mood and anxiety signs and symptoms. Continue Deplin 15 mg p.o. nightly for augmentation of depression and since patient is homozygous for MTHFR mutation. Generalized anxiety disorder - Plan: ALPRAZolam (XANAX) 0.5 MG tablet  Primary insomnia - Plan: mirtazapine (REMERON) 15 MG tablet  MDD (recurrent major depressive disorder) - Plan: mirtazapine (REMERON) 15 MG tablet, L-Methylfolate-Algae (DEPLIN 15) 15-90.314 MG CAPS  Homozygous for MTHFR gene mutation (HCC) - Plan: L-Methylfolate-Algae (DEPLIN 15) 15-90.314 MG CAPS  Please see After Visit Summary for patient specific instructions.  Future Appointments  Date Time Provider Department Center  08/13/2018  4:45 PM  Corie Chiquito, PMHNP [facility] None  09/19/2018  5:00 PM Corie Chiquito, PMHNP [facility] None  12/20/2018  3:30 PM Kirsteins, Victorino Sparrow, MD [facility] None    No orders of the defined types were placed in this encounter.     -------------------------------

## 2018-06-20 NOTE — Progress Notes (Signed)
Subjective:    Patient ID: Faith BadeMary Anna Thomas, female    DOB: 09-02-1960, 58 y.o.   MRN: [id number]  HPI 58 year old female with fibromyalgia syndrome and chronic gluteal pain returns for follow-up visit.  She is overall doing better in terms of her mood and sleep.  She has had some medication changes prescribed by her psychiatrist.  Psych Corie ChiquitoJessica Carter- Remeron working better than Wellbutrin, Helped with mood and sleep Takes tramadol in am only Getting dry needling on Left glut Walks 5 day per 25min at lunch Yoga 90min twice a week.  The patient remains on Eliquis for history of PE.  Patient is hoping to get off of tramadol and is wondering whether she can use ibuprofen in place of tramadol.  We discussed the risks of doing this while on Eliquis which would involve a more significant GI bleed should she have mild gastritis induced by the ibuprofen as well as potential over anticoagulation pain Inventory Average Pain 4 Pain Right Now 3 My pain is sharp, burning, dull, stabbing and aching  In the last 24 hours, has pain interfered with the following? General activity 4 Relation with others 5 Enjoyment of life 4 What TIME of day is your pain at its worst? . Sleep (in general) Fair  Pain is worse with: unsure and some activites Pain improves with: rest, heat/ice, therapy/exercise, pacing activities and medication Relief from Meds: 7  Mobility walk without assistance  Function employed # of hrs/week 40  Neuro/Psych weakness  Prior Studies Any changes since last visit?  no  Physicians involved in your care Any changes since last visit?  no   Family History  Problem Relation Age of Onset  . Arthritis Mother   . Hypertension Mother   . Mental illness Mother   . Anxiety disorder Mother   . Depression Mother   . Arthritis Father   . Hypertension Father   . Mental illness Father   . Anxiety disorder Father   . Depression Father   . Mental illness Brother   . ADD /  ADHD Brother   . Psychosis Brother   . Post-traumatic stress disorder Brother   . Arthritis Daughter   . Autism Daughter   . Cancer Maternal Aunt   . Autism Maternal Aunt   . Hypertension Maternal Grandmother   . Arthritis Maternal Grandfather   . Depression Cousin   . Anxiety disorder Cousin   . Depression Maternal Aunt    Social History   Socioeconomic History  . Marital status: Divorced    Spouse name: Not on file  . Number of children: Not on file  . Years of education: Not on file  . Highest education level: Not on file  Occupational History  . Not on file  Social Needs  . Financial resource strain: Not on file  . Food insecurity:    Worry: Not on file    Inability: Not on file  . Transportation needs:    Medical: Not on file    Non-medical: Not on file  Tobacco Use  . Smoking status: Never Smoker  . Smokeless tobacco: Never Used  Substance and Sexual Activity  . Alcohol use: Yes    Alcohol/week: 0.0 standard drinks    Comment: occasional   . Drug use: No  . Sexual activity: Not on file  Lifestyle  . Physical activity:    Days per week: Not on file    Minutes per session: Not on file  . Stress: Not on  file  Relationships  . Social connections:    Talks on phone: Not on file    Gets together: Not on file    Attends religious service: Not on file    Active member of club or organization: Not on file    Attends meetings of clubs or organizations: Not on file    Relationship status: Not on file  Other Topics Concern  . 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 Past Medical History:  Diagnosis Date  . Allergy   . Depression   . DVT (deep venous thrombosis) (HCC)   . Fibromyalgia   . Genetic defect   . GERD (gastroesophageal reflux disease)   . History of IBS   . 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 Hypertension     . PE (pulmonary thromboembolism) (HCC)   . Positive TB test   . Raynaud disease   . Scoliosis   . Urine incontinence    BP 119/84   Pulse 71   Ht 5\' 5"  (1.651 m)   Wt 160 lb (72.6 kg)   SpO2 95%   BMI 26.63 kg/m   Opioid Risk Score:   Fall Risk Score:  `1  Depression screen PHQ 2/9  Dep[facility] screen [facility] 01/07/2018 09/06/2017  Decreased Interest 3 0  Down, Depressed, Hopeless 3 2  PHQ - 2 Score 6 2     Review of Systems  Constitutional: Negative.   HENT: Negative.   Eyes: Negative.   Respiratory: Negative.   Cardiovascular: Negative.   Gastrointestinal: Negative.   Endocrine: Negative.   Genitourinary: Negative.   Musculoskeletal: Positive for arthralgias and myalgias.  Skin: Negative.   Allergic/Immunologic: Negative.   Neurological: Positive for weakness.  Hematological: Negative.   Psychiatric/Behavioral: Negative.   All other systems reviewed and are negative.      Objective:   Physical Exam Vitals signs and nursing note reviewed.  Constitutional:      Appearance: Normal appearance.  HENT:     Head: Normocephalic and atraumatic.     Mouth/Throat:     Mouth: Mucous membranes are moist.  Eyes:     General: Scleral icterus present.     Conjunctiva/sclera: Conjunctivae normal.  Musculoskeletal:     Right hip: She exhibits decreased range of motion and tenderness.     Left hip: She exhibits decreased range of motion and tenderness.     Lumbar back: She exhibits normal range of motion and no tenderness.     Comments: Reduced hip abduction  Palpation for fibromyalgia tender points is negative with the exception of greater trochanter of the hips bilaterally  Skin:    General: Skin is warm and dry.  Neurological:     General: No focal deficit present.     Mental Status: She is alert and oriented to person, place, and time.     Sensory: Sensation is intact.     Motor: Motor function is intact.     Coordination: Coordination is intact.     Gait: Gait  is intact.     Comments: Motor strength is 5/5 bilateral deltoid bicep tricep grip, hip flexor knee extensor ankle dorsiflexor Negative straight leg raise  Psychiatric:        Mood and Affect: Mood normal.        Behavior: Behavior normal.  Assessment & Plan:  1.  Fibromyalgia syndrome: Patient has no recent flareups with diffuse body pain.  She is managing this well through combination of physical therapy yoga regular moderate aerobic exercise as well as Lyrica 25 units in the morning and 50 units at night Physical medicine rehab follow-up in 6 months, toxicology at next visit, PMP aware reviewed CSA in place  2.  History of depression on both Zoloft and Remeron follows up with Dr. Corie Chiquito from psychiatry also takes Xanax low-dose for anxiety  #3.  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

## 2018-06-24 ENCOUNTER — Ambulatory Visit: Payer: PRIVATE HEALTH INSURANCE | Admitting: Physical Medicine & Rehabilitation

## 2018-06-27 ENCOUNTER — Other Ambulatory Visit: Payer: Self-pay | Admitting: *Deleted

## 2018-06-27 MED ORDER — TRAMADOL HCL 50 MG PO TABS: ORAL_TABLET | ORAL | 2 refills | Status: DC

## 2018-07-12 ENCOUNTER — Other Ambulatory Visit: Payer: Self-pay | Admitting: Psychiatry

## 2018-07-12 DIAGNOSIS — F5101 Primary insomnia: Secondary | ICD-10-CM

## 2018-07-13 ENCOUNTER — Other Ambulatory Visit: Payer: Self-pay | Admitting: Psychiatry

## 2018-07-13 DIAGNOSIS — F5101 Primary insomnia: Secondary | ICD-10-CM

## 2018-07-13 DIAGNOSIS — F3340 Major depressive disorder, recurrent, in remission, unspecified: Secondary | ICD-10-CM

## 2018-07-23 ENCOUNTER — Other Ambulatory Visit: Payer: Self-pay | Admitting: Family Medicine

## 2018-08-13 ENCOUNTER — Ambulatory Visit (INDEPENDENT_AMBULATORY_CARE_PROVIDER_SITE_OTHER): Payer: PRIVATE HEALTH INSURANCE | Admitting: Psychiatry

## 2018-08-13 ENCOUNTER — Telehealth: Payer: Self-pay | Admitting: *Deleted

## 2018-08-13 ENCOUNTER — Encounter: Payer: Self-pay | Admitting: Psychiatry

## 2018-08-13 VITALS — BP 127/79 | HR 68

## 2018-08-13 DIAGNOSIS — F3340 Major depressive disorder, recurrent, in remission, unspecified: Secondary | ICD-10-CM

## 2018-08-13 DIAGNOSIS — F411 Generalized anxiety disorder: Secondary | ICD-10-CM | POA: Diagnosis not present

## 2018-08-13 DIAGNOSIS — F5101 Primary insomnia: Secondary | ICD-10-CM

## 2018-08-13 MED ORDER — SERTRALINE HCL 100 MG PO TABS: 200.0000 mg | ORAL_TABLET | Freq: Every day | ORAL | 1 refills | Status: DC

## 2018-08-13 MED ORDER — TRAZODONE HCL 100 MG PO TABS: ORAL_TABLET | ORAL | 1 refills | Status: DC

## 2018-08-13 MED ORDER — ALPRAZOLAM 0.5 MG PO TABS: ORAL_TABLET | ORAL | 2 refills | Status: DC

## 2018-08-13 NOTE — Telephone Encounter (Signed)
Pharmacist says there is a drug to drug interaction with tramadol and doxepin.  They need to know if we are ok with it.  Please advise.

## 2018-08-13 NOTE — Progress Notes (Signed)
Faith Thomas [id number] September 29, 1960 58 y.o.  Subjective:   Patient ID:  Faith Thomas is a 58 y.o. (DOB Apr 23, 1961) female.  Chief Complaint:  Chief Complaint  Patient presents with  . Insomnia  . Follow-up    h/o anxiety and depression    HPI Faith Thomas presents to the office today for follow-up of depression, anxiety, and insomnia. She reports that it is taking awhile for her to fall asleep about half of the nights, and can take over an hour to fall asleep. She reports that she has used multiple sleep hygiene strategies (warm bath, relaxing music, meditation, avoiding blue light exposure, etc). She reports that she then feels tired in the morning and then needing to drink coffee in the morning. She reports that once she falls asleep she typically is able to stay asleep. Averages about 6 hours of sleep a night and that she typically needs 8-9 hours of sleep a night in order to feel rested.   "My mood is better." She reports that she has noticed some irritability in the last few weeks and that this could be situational. Denies depressed mood- "I still get discouraged sometimes" typically in response to health issues and limitations. She reports that she is enjoying more things than she has in the past and has been able to be more social. She reports that her appetite was decreased during recent flu and has been improving. Energy has been fair. Motivation is fair and "a little higher than my energy." Concentration has improved. Denies SI.   Reports that she is typically taking Xanax 0.5 mg 1-2 po QHS  Past Medication Trials: Buspar Trintellix- memory difficulties Sertraline Cymbalta- memory difficulties Prozac-stomach pain  Paxil Celexa Lexapro Wellbutrin XL- Irritability Lamictal- ineffective Klonopin Xanax Doxepin Trazodone- stopped working [place]in  Review of Systems:  Review of Systems  HENT: Positive for tinnitus.   Gastrointestinal: Positive for  diarrhea.  Musculoskeletal: Negative for gait problem.  Neurological: Negative for tremors.  Psychiatric/Behavioral:       Please refer to HPI    Medications: I have reviewed the patient's current medications.  Current Outpatient Medications  Medication Sig Dispense Refill  . acetaminophen (TYLENOL) 500 MG tablet Take 1,000 mg by mouth every 8 (eight) hours as needed.     Melene Muller ON 10/05/2018] ALPRAZolam (XANAX) 0.5 MG tablet Take 1 tab po QHS and 1 tab po qd prn anxiety 60 tablet 2  . apixaban (ELIQUIS) 5 MG TABS tablet Take 1 tablet (5 mg total) by mouth 2 (two) times daily. 180 tablet 1  . b complex vitamins capsule Take 1 capsule by mouth daily.    . Cholecalciferol (VITAMIN D-3) 1000 units CAPS Take 10,000 Units by mouth daily.     Marland Kitchen ELIQUIS 5 MG TABS tablet TAKE 1 TABLET (5 MG TOTAL) BY MOUTH 2 (TWO) TIMES DAILY WITH A MEAL. 60 tablet 0  . guaiFENesin (MUCINEX) 600 MG 12 hr tablet Take by mouth 2 (two) times daily.    Marland Kitchen L-Methylfolate-Algae (DEPLIN 15) 15-90.314 MG CAPS Take 1 capsule by mouth daily. 30 capsule 3  . lisinopril (PRINIVIL,ZESTRIL) 10 MG tablet TAKE 1 TABLET BY MOUTH EVERY DAY 34 tablet 2  . Methylcobalamin (B-12) 5000 MCG TBDP Inject 0.1 mLs into the muscle once a week.     . mirtazapine (REMERON) 15 MG tablet Take 1 tablet (15 mg total) by mouth at bedtime. 90 tablet 1  . mometasone (NASONEX) 50 MCG/ACT nasal spray USE ONE SPRAY IN  EACH NOSTRIL ONE TIME DAILY (Patient taking differently: daily as needed. ) 17 g 5  . Omega 3 1200 MG CAPS Take 1,200 mg by mouth daily.    . Probiotic Product (PROBIOTIC PO) Take 1 capsule by mouth daily.     Marland Kitchen SILENOR 6 MG TABS TAKE 1 TABLET BY MOUTH NIGHTLY AT BEDTIME 30 tablet 5  . traMADol (ULTRAM) 50 MG tablet TAKE 1 TABLET BY MOUTH TWICE A DAY AS NEEDED FOR MODERATE PAIN 60 tablet 2  . vitamin C (ASCORBIC ACID) 500 MG tablet Take 1,000 mg by mouth daily.     . sertraline (ZOLOFT) 100 MG tablet Take 2 tablets (200 mg total) by mouth  at bedtime. 180 tablet 1  . traZODone (DESYREL) 100 MG tablet Take 1/2-1 tablet po QHS prn insomnia 90 tablet 1   No current facility-administered medications for this visit.     Medication Side Effects: None  Allergies:  Allergies  Allergen Reactions  . Citalopram Tinitus  . Escitalopram Tinitus  . Bee Venom Swelling  . Latex Swelling  . Penicillins Swelling and Rash    Has patient had a PCN reaction causing immediate rash, facial/tongue/throat swelling, SOB or lightheadedness with hypotension: Yes Has patient had a PCN reaction causing severe rash involving mucus membranes or skin necrosis: No Has patient had a PCN reaction that required hospitalization: No Has patient had a PCN reaction occurring within the last 10 years: No If all of the above answers are "NO", then may proceed with Cephalosporin use.  Marland Kitchen Cymbalta [Duloxetine Hcl]     -memory loss  . Escitalopram Oxalate Tinitus  . Other     Yeast creams- causes secondary infections, itching, burning, redness   . Progesterone     Pt feels caused weakness, fatigue, cognitive deficits  . Trintellix [Vortioxetine]     Memory problems  . Bupropion Other (See Comments)    weakness and malaise.  . Duloxetine Other (See Comments)    Past Medical History:  Diagnosis Date  . Allergy   . Depression   . DVT (deep venous thrombosis) (HCC)   . Fibromyalgia   . Genetic defect   . GERD (gastroesophageal reflux disease)   . History of IBS   . 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 Hypertension   . PE (pulmonary thromboembolism) (HCC)   . Positive TB test   . Raynaud disease   . Scoliosis   . Urine incontinence     Family History  Problem Relation Age of Onset  . Arthritis Mother   . Hypertension Mother   . Mental illness Mother   . Anxiety disorder Mother   . Depression Mother   . Arthritis Father   . Hypertension Father   . Mental illness Father   . Anxiety disorder Father   . Depression Father   .  Mental illness Brother   . ADD / ADHD Brother   . Psychosis Brother   . Post-traumatic stress disorder Brother   . Arthritis Daughter   . Autism Daughter   . Cancer Maternal Aunt   . Autism Maternal Aunt   . Hypertension Maternal Grandmother   . Arthritis Maternal Grandfather   . Depression Cousin   . Anxiety disorder Cousin   . Depression Maternal Aunt     Social History   Socioeconomic History  . Marital status: Divorced    Spouse name: Not on file  . Number of children: Not on file  . Years of education: Not  on file  . Highest education level: Not on file  Occupational History  . Not on file  Social Needs  . Financial resource strain: Not on file  . Food insecurity:    Worry: Not on file    Inability: Not on file  . Transportation needs:    Medical: Not on file    Non-medical: Not on file  Tobacco Use  . Smoking status: Never Smoker  . Smokeless tobacco: Never Used  Substance and Sexual Activity  . Alcohol use: Yes    Alcohol/week: 0.0 standard drinks    Comment: occasional   . Drug use: No  . Sexual activity: Not on file  Lifestyle  . Physical activity:    Days per week: Not on file    Minutes per session: Not on file  . Stress: Not on file  Relationships  . Social connections:    Talks on phone: Not on file    Gets together: Not on file    Attends religious service: Not on file    Active member of club or organization: Not on file    Attends meetings of clubs or organizations: Not on file    Relationship status: Not on file  . Intimate partner violence:    Fear of current or ex partner: Not on file    Emotionally abused: Not on file    Physically abused: Not on file    Forced sexual activity: Not on file  Other Topics Concern  . 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   Please see review of systems for further details on the patient's review from today.   Objective:   Physical Exam:  BP 127/79   Pulse 68   Physical Exam Constitutional:      General: She is not in acute distress.    Appearance: She is well-developed.  Musculoskeletal:        General: No deformity.  Neurological:     Mental Status: She is alert and oriented to person, place, and time.     Coordination: Coordination normal.  Psychiatric:        Attention and Perception: Attention and perception normal. She does not perceive auditory or visual hallucinations.        Mood and Affect: Mood normal. Mood is not anxious or depressed. Affect is not labile, blunt, angry or inappropriate.        Speech: Speech normal.        Behavior: Behavior normal.        Thought Content: Thought content normal. Thought content does not include homicidal or suicidal ideation. Thought content does not include homicidal or suicidal plan.        Cognition and Memory: Cognition and memory normal.        Judgment: Judgment normal.     Comments: Insight intact. No delusions.      Lab Review:     Component Value Date/Time   NA 137 09/06/2017 0750   NA 134 (L) 01/05/2017 1359   K 4.5 09/06/2017 0750   K 3.9 01/05/2017 1359   CL 100 09/06/2017 0750   CO2 29 09/06/2017 0750   CO2 25 01/05/2017 1359   GLUCOSE 81 09/06/2017 0750   GLUCOSE 106 01/05/2017 1359   BUN 17 09/06/2017 0750  BUN 10.2 01/05/2017 1359   CREATININE 0.85 09/06/2017 0750   CREATININE 0.8 01/05/2017 1359   CALCIUM 9.7 09/06/2017 0750   CALCIUM 9.5 01/05/2017 1359   PROT 6.4 01/05/2017 1359   ALBUMIN 4.2 01/05/2017 1359   AST 18 01/05/2017 1359   ALT 15 01/05/2017 1359   ALKPHOS 60 01/05/2017 1359   BILITOT 0.22 01/05/2017 1359   GFRNONAA >60 12/04/2016 1548   GFRAA >60 12/04/2016 1548       Component Value Date/Time   WBC 5.2 09/06/2017 0750   RBC 4.29 09/06/2017 0750   HGB 13.9 09/06/2017  0750   HGB 13.4 01/05/2017 1359   HCT 41.1 09/06/2017 0750   HCT 39.0 01/05/2017 1359   PLT 287.0 09/06/2017 0750   PLT 274 01/05/2017 1359   MCV 95.9 09/06/2017 0750   MCV 97.5 01/05/2017 1359   MCH 33.5 01/05/2017 1359   MCH 33.4 12/04/2016 1548   MCHC 33.8 09/06/2017 0750   RDW 13.4 09/06/2017 0750   RDW 12.4 01/05/2017 1359   LYMPHSABS 3.0 01/05/2017 1359   MONOABS 0.6 01/05/2017 1359   EOSABS 0.1 01/05/2017 1359   BASOSABS 0.0 01/05/2017 1359    No results found for: POCLITH, LITHIUM   No results found for: PHENYTOIN, PHENOBARB, VALPROATE, CBMZ   .res Assessment: Plan:   Discussed that it may be helpful to alternate between Trazodone prn and Silenor prn since both medications were initially helpful and then no longer as effective.  Discussed considering hydroxyzine if sleep does not improve.  Will continue all other medications as prescribed since pt reports that overall her mood and anxiety are stable.   Primary insomnia - Plan: traZODone (DESYREL) 100 MG tablet  Generalized anxiety disorder - Plan: ALPRAZolam (XANAX) 0.5 MG tablet, sertraline (ZOLOFT) 100 MG tablet  MDD (recurrent major depressive disorder) - Plan: sertraline (ZOLOFT) 100 MG tablet  Please see After Visit Summary for patient specific instructions.  Future Appointments  Date Time Provider Department Center  11/14/2018  5:00 PM Corie Chiquito, PMHNP [facility] None  12/20/2018  3:30 PM Kirsteins, Victorino Sparrow, MD [id number] None    No orders of the defined types were placed in this encounter.     -------------------------------

## 2018-08-14 NOTE — Telephone Encounter (Signed)
Pt picked up Rx for Doxepin from a [facility] 07-19-2018 and transferred to [facility]. Corie Chiquito, NP at [phone] prescribed doxepin, sertraline and trazodone. Would you Corie Chiquito, NP to be called to discuss this drug interaction?

## 2018-08-14 NOTE — Telephone Encounter (Signed)
Given that pt is on sertraline,remeron tramadol, and trazodone , I would not recommend adding doxepin unless one of these meds is discontinued,

## 2018-08-14 NOTE — Telephone Encounter (Signed)
Yes pharmacy should call her since she is prescribing the majority of the serotonergic agents

## 2018-08-14 NOTE — Telephone Encounter (Deleted)
The silenor (doxepin) is prescribed by her [facility] NP and she was given 90 day rx 04/12/18 w/1rf and they filled it again 07/13/18 w 5 refills.  What do you suggest she stop?

## 2018-08-15 NOTE — Telephone Encounter (Signed)
Called and spoke pharmacist. She said they will call Corie Chiquito, NP

## 2018-09-04 ENCOUNTER — Other Ambulatory Visit: Payer: Self-pay | Admitting: Psychiatry

## 2018-09-04 DIAGNOSIS — E7212 Methylenetetrahydrofolate reductase deficiency: Secondary | ICD-10-CM

## 2018-09-04 DIAGNOSIS — F3340 Major depressive disorder, recurrent, in remission, unspecified: Secondary | ICD-10-CM

## 2018-09-04 DIAGNOSIS — Z1589 Genetic susceptibility to other disease: Secondary | ICD-10-CM

## 2018-09-11 ENCOUNTER — Other Ambulatory Visit: Payer: Self-pay

## 2018-09-11 DIAGNOSIS — F411 Generalized anxiety disorder: Secondary | ICD-10-CM

## 2018-09-11 MED ORDER — ALPRAZOLAM 0.5 MG PO TABS: ORAL_TABLET | ORAL | 2 refills | Status: DC

## 2018-09-11 MED ORDER — BUSPIRONE HCL 30 MG PO TABS: 30.0000 mg | ORAL_TABLET | Freq: Two times a day (BID) | ORAL | 0 refills | Status: DC

## 2018-09-11 NOTE — Telephone Encounter (Signed)
Pt changing pharmacies from [facility] to [facility] on [address]. They won't transfer her Alprazolam. Ok to submit per provider. Request for Buspar 30 mg also.

## 2018-09-19 ENCOUNTER — Ambulatory Visit: Payer: PRIVATE HEALTH INSURANCE | Admitting: Psychiatry

## 2018-09-27 ENCOUNTER — Other Ambulatory Visit: Payer: Self-pay

## 2018-09-27 MED ORDER — TRAMADOL HCL 50 MG PO TABS: ORAL_TABLET | ORAL | 2 refills | Status: DC

## 2018-10-10 ENCOUNTER — Telehealth: Payer: Self-pay | Admitting: *Deleted

## 2018-10-10 NOTE — Telephone Encounter (Signed)
I do not order or interpret Mediator Release Tests.  I would ask the physician who ordered this test for an interpretation and advice regarding the results.

## 2018-10-10 NOTE — Telephone Encounter (Signed)
I spoke with the patient over the phone.  She states she had MRT sensitivity testing done and it showed that she has a sensitivity to acetaminophen which she takes along with her tramadol. 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 Should she continue to take acetaminophen?  She contacted hematology, Dr. Candise Che and his office stated that a visit was not necessary.  Patient seems to be conflicted as to why they do not want to see her. She asks if a referral can be made to hematology?

## 2018-10-10 NOTE — Telephone Encounter (Signed)
Patient notified

## 2018-11-11 NOTE — Telephone Encounter (Signed)
Please call in robaxin 500mg  TID prn #45 no RF

## 2018-11-12 ENCOUNTER — Other Ambulatory Visit: Payer: Self-pay | Admitting: *Deleted

## 2018-11-12 MED ORDER — METHOCARBAMOL 500 MG PO TABS: 500.0000 mg | ORAL_TABLET | Freq: Three times a day (TID) | ORAL | 0 refills | Status: DC | PRN

## 2018-11-14 ENCOUNTER — Ambulatory Visit: Payer: PRIVATE HEALTH INSURANCE | Admitting: Psychiatry

## 2018-11-18 ENCOUNTER — Ambulatory Visit (INDEPENDENT_AMBULATORY_CARE_PROVIDER_SITE_OTHER): Payer: PRIVATE HEALTH INSURANCE | Admitting: Psychiatry

## 2018-11-18 ENCOUNTER — Other Ambulatory Visit: Payer: Self-pay

## 2018-11-18 ENCOUNTER — Encounter: Payer: Self-pay | Admitting: Psychiatry

## 2018-11-18 DIAGNOSIS — F5101 Primary insomnia: Secondary | ICD-10-CM

## 2018-11-18 DIAGNOSIS — F3340 Major depressive disorder, recurrent, in remission, unspecified: Secondary | ICD-10-CM

## 2018-11-18 DIAGNOSIS — F411 Generalized anxiety disorder: Secondary | ICD-10-CM

## 2018-11-18 MED ORDER — ALPRAZOLAM 0.5 MG PO TABS: ORAL_TABLET | ORAL | 2 refills | Status: DC

## 2018-11-18 MED ORDER — SERTRALINE HCL 100 MG PO TABS: 200.0000 mg | ORAL_TABLET | Freq: Every day | ORAL | 1 refills | Status: DC

## 2018-11-18 MED ORDER — BUSPIRONE HCL 30 MG PO TABS: 30.0000 mg | ORAL_TABLET | Freq: Two times a day (BID) | ORAL | 1 refills | Status: DC

## 2018-11-18 MED ORDER — MIRTAZAPINE 15 MG PO TABS: 15.0000 mg | ORAL_TABLET | Freq: Every day | ORAL | 1 refills | Status: DC

## 2018-11-18 NOTE — Progress Notes (Signed)
Faith Thomas [id number] 03-30-1961 58 y.o.  Virtual Visit via Telephone Note  I connected with pt on 11/18/18 at  8:30 AM EDT by telephone and verified that I am speaking with the correct person using two identifiers.   I discussed the limitations, risks, security and privacy concerns of performing an evaluation and management service by telephone and the availability of in person appointments. I also discussed with the patient that there may be a patient responsible charge related to this service. The patient expressed understanding and agreed to proceed.   I discussed the assessment and treatment plan with the patient. The patient was provided an opportunity to ask questions and all were answered. The patient agreed with the plan and demonstrated an understanding of the instructions.   The patient was advised to call back or seek an in-person evaluation if the symptoms worsen or if the condition fails to improve as anticipated.  I provided 30 minutes of non-face-to-face time during this encounter.  The patient was located at home.  The provider was located at [place].   Thayer Headings, PMHNP   Subjective:   Patient ID:  Faith Thomas is a 58 y.o. (DOB 05/05/1961) female.  Chief Complaint:  Chief Complaint  Patient presents with  . Follow-up    Depression, Anxiety, and insomnia    HPI Faith Thomas presents for follow-up of depression, anxiety, and insomnia. She reports that she has been able to get more rest during the pandemic. Working from home during the pandemic. She reports that she lives in a town home community and is able to see neighbors from a distance. She reports that she had food sensitivity testing and that results indicate she has a sensitivity to acetaminophen. She reports that this initially caused her some anxiety since acetaminophen is part of her regimen for pain. Recognizes that some GI s/s are similar to s/s she has had with  severe anxiety and panic and is being mindful about this sensation and recognizing that it is not an indicator of anxiety. She reports that she had increased difficulty with sleep initiation after eliminating foods that she learned that she has a sensitivity to. Now going to bed 10-11 pm and no longer having difficulty falling asleep. She reports that she awakens occ with pain and to urinate. Reports that she is typically able to return to sleep. Reports that her appetite has been affected by GI s/s and needing to eat small amounts since having an empty stomach triggers nausea.  Reports the generalized anxiety has improved overall. Reports episodic anxiety in response to certain identifiable triggers. Reports that she experiences more frustration instead of depression and is working on accepting physical changes.   She reports that her energy and motivation have improved. She reports "I still have that frustration that my body won't let me proceed" with what she would like to do. Has been able to plant flowers and do some activities that bring her joy. She reports that her concentration has improved and attributes this to not having "as much I have to concentrate." Denies SI.   Reports that her brother seems to be functioning at a higher level and is not requiring as much of her support and is now able to help her with family issues. Reports that her daughter seems to be more settled now. Reports granddaughter seems to be doing well and is healthy.   She reports that she has been taking Xanax prn once daily over the past  week.   Past Medication Trials: Buspar Trintellix- memory difficulties Sertraline Cymbalta- memory difficulties Prozac-stomach pain  Paxil Celexa Lexapro Wellbutrin XL- Irritability Lamictal- ineffective Klonopin Xanax Doxepin Trazodone- stopped working Deplin  Review of Systems:  Review of Systems  Gastrointestinal: Positive for abdominal pain, diarrhea and nausea.        Reports some improvement in GI s/s with food restriction diet and probiotic.   Musculoskeletal: Positive for arthralgias and myalgias. Negative for gait problem.  Allergic/Immunologic:       Had food sensitivity testing. Learned that she has sensitivity to Acetaminophen and Brussel sprouts.  Neurological: Negative for tremors.  Psychiatric/Behavioral:       Please refer to HPI   Has apt with medical provider in mid-July.   Medications: I have reviewed the patient's current medications.  Current Outpatient Medications  Medication Sig Dispense Refill  . acetaminophen (TYLENOL) 500 MG tablet Take 1,000 mg by mouth every 8 (eight) hours as needed.     Melene Muller ON 12/06/2018] ALPRAZolam (XANAX) 0.5 MG tablet Take 1 tab po QHS and 1 tab po qd prn anxiety 60 tablet 2  . AMINO ACIDS PO Take by mouth.    Marland Kitchen apixaban (ELIQUIS) 5 MG TABS tablet Take 1 tablet (5 mg total) by mouth 2 (two) times daily. 180 tablet 1  . b complex vitamins capsule Take 1 capsule by mouth daily.    . busPIRone (BUSPAR) 30 MG tablet Take 1 tablet (30 mg total) by mouth 2 (two) times daily. 180 tablet 1  . Cholecalciferol (VITAMIN D-3) 1000 units CAPS Take 10,000 Units by mouth daily.     Marland Kitchen ELIQUIS 5 MG TABS tablet TAKE 1 TABLET (5 MG TOTAL) BY MOUTH 2 (TWO) TIMES DAILY WITH A MEAL. 60 tablet 0  . Glutamine POWD by Does not apply route.    Marland Kitchen KRILL OIL PO Take by mouth.    Marland Kitchen L-Methylfolate-Algae (DEPLIN 15) 15-90.314 MG CAPS TAKE 1 CAPSULE BY MOUTH EVERY DAY 90 capsule 2  . lisinopril (PRINIVIL,ZESTRIL) 10 MG tablet TAKE 1 TABLET BY MOUTH EVERY DAY 34 tablet 2  . MAGNESIUM MALATE PO Take by mouth.    . methocarbamol (ROBAXIN) 500 MG tablet Take 1 tablet (500 mg total) by mouth 3 (three) times daily as needed for muscle spasms. 45 tablet 0  . Methylcobalamin (B-12) 5000 MCG TBDP Inject 0.1 mLs into the muscle once a week.     . mometasone (NASONEX) 50 MCG/ACT nasal spray USE ONE SPRAY IN EACH NOSTRIL ONE TIME DAILY (Patient  taking differently: daily as needed. ) 17 g 5  . Probiotic Product (PROBIOTIC PO) Take 1 capsule by mouth daily.     . traMADol (ULTRAM) 50 MG tablet TAKE 1 TABLET BY MOUTH TWICE A DAY AS NEEDED FOR MODERATE PAIN 60 tablet 2  . traZODone (DESYREL) 100 MG tablet Take 1/2-1 tablet po QHS prn insomnia 90 tablet 1  . vitamin C (ASCORBIC ACID) 500 MG tablet Take 1,000 mg by mouth daily.     . busPIRone (BUSPAR) 30 MG tablet Take 1 tablet (30 mg total) by mouth 2 (two) times daily. 180 tablet 1  . mirtazapine (REMERON) 15 MG tablet Take 1 tablet (15 mg total) by mouth at bedtime. 90 tablet 1  . sertraline (ZOLOFT) 100 MG tablet Take 2 tablets (200 mg total) by mouth at bedtime. 180 tablet 1  . sucralfate (CARAFATE) 1 g tablet TK 1 T PO BID     No current facility-administered medications for  this visit.     Medication Side Effects: None  Allergies:  Allergies  Allergen Reactions  . Citalopram Tinitus  . Escitalopram Tinitus  . Bee Venom Swelling  . Latex Swelling  . Penicillins Swelling and Rash    Has patient had a PCN reaction causing immediate rash, facial/tongue/throat swelling, SOB or lightheadedness with hypotension: Yes Has patient had a PCN reaction causing severe rash involving mucus membranes or skin necrosis: No Has patient had a PCN reaction that required hospitalization: No Has patient had a PCN reaction occurring within the last 10 years: No If all of the above answers are "NO", then may proceed with Cephalosporin use.  Marland Kitchen. Cymbalta [Duloxetine Hcl]     -memory loss  . Escitalopram Oxalate Tinitus  . Other     Yeast creams- causes secondary infections, itching, burning, redness   . Progesterone     Pt feels caused weakness, fatigue, cognitive deficits  . Trintellix [Vortioxetine]     Memory problems  . Bupropion Other (See Comments)    weakness and malaise.  . Duloxetine Other (See Comments)    Past Medical History:  Diagnosis Date  . Allergy   . Depression   . DVT  (deep venous thrombosis) (HCC)   . Fibromyalgia   . Genetic defect   . GERD (gastroesophageal reflux disease)   . History of IBS   . 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 Hypertension   . PE (pulmonary thromboembolism) (HCC)   . Positive TB test   . Raynaud disease   . Scoliosis   . Urine incontinence     Family History  Problem Relation Age of Onset  . Arthritis Mother   . Hypertension Mother   . Mental illness Mother   . Anxiety disorder Mother   . Depression Mother   . Arthritis Father   . Hypertension Father   . Mental illness Father   . Anxiety disorder Father   . Depression Father   . Mental illness Brother   . ADD / ADHD Brother   . Psychosis Brother   . Post-traumatic stress disorder Brother   . Arthritis Daughter   . Autism Daughter   . Cancer Maternal Aunt   . Autism Maternal Aunt   . Hypertension Maternal Grandmother   . Arthritis Maternal Grandfather   . Depression Cousin   . Anxiety disorder Cousin   . Depression Maternal Aunt     Social History   Socioeconomic History  . Marital status: Divorced    Spouse name: Not on file  . Number of children: Not on file  . Years of education: Not on file  . Highest education level: Not on file  Occupational History  . Not on file  Social Needs  . Financial resource strain: Not on file  . Food insecurity    Worry: Not on file    Inability: Not on file  . Transportation needs    Medical: Not on file    Non-medical: Not on file  Tobacco Use  . Smoking status: Never Smoker  . Smokeless tobacco: Never Used  Substance and Sexual Activity  . Alcohol use: Yes    Alcohol/week: 0.0 standard drinks    Comment: occasional   . Drug use: No  . Sexual activity: Not on file  Lifestyle  . Physical activity    Days per week: Not on file    Minutes per session: Not on file  . Stress: Not on file  Relationships  .  Social Musicianconnections    Talks on phone: Not on file    Gets together: Not on file     Attends religious service: Not on file    Active member of club or organization: Not on file    Attends meetings of clubs or organizations: Not on file    Relationship status: Not on file  . Intimate partner violence    Fear of current or ex partner: Not on file    Emotionally abused: Not on file    Physically abused: Not on file    Forced sexual activity: Not on file  Other Topics Concern  . 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   Please see review of systems for further details on the patient's review from today.   Objective:   Physical Exam:  There were no vitals taken for this visit.  Physical Exam Neurological:     Mental Status: She is alert and oriented to person, place, and time.     Cranial Nerves: No dysarthria.  Psychiatric:        Attention and Perception: Attention normal.        Mood and Affect: Mood normal.        Speech: Speech normal.        Behavior: Behavior is cooperative.        Thought Content: Thought content normal. Thought content is not paranoid or delusional. Thought content does not include homicidal or suicidal ideation. Thought content does not include homicidal or suicidal plan.        Cognition and Memory: Cognition and memory normal.        Judgment: Judgment normal.     Lab Review:     Component Value Date/Time   NA 137 09/06/2017 0750   NA 134 (L) 01/05/2017 1359   K 4.5 09/06/2017 0750   K 3.9 01/05/2017 1359   CL 100 09/06/2017 0750   CO2 29 09/06/2017 0750   CO2 25 01/05/2017 1359   GLUCOSE 81 09/06/2017 0750   GLUCOSE 106 01/05/2017 1359   BUN 17 09/06/2017 0750   BUN 10.2 01/05/2017 1359   CREATININE 0.85 09/06/2017 0750   CREATININE 0.8 01/05/2017 1359   CALCIUM 9.7 09/06/2017 0750   CALCIUM 9.5 01/05/2017 1359    PROT 6.4 01/05/2017 1359   ALBUMIN 4.2 01/05/2017 1359   AST 18 01/05/2017 1359   ALT 15 01/05/2017 1359   ALKPHOS 60 01/05/2017 1359   BILITOT 0.22 01/05/2017 1359   GFRNONAA >60 12/04/2016 1548   GFRAA >60 12/04/2016 1548       Component Value Date/Time   WBC 5.2 09/06/2017 0750   RBC 4.29 09/06/2017 0750   HGB 13.9 09/06/2017 0750   HGB 13.4 01/05/2017 1359   HCT 41.1 09/06/2017 0750   HCT 39.0 01/05/2017 1359   PLT 287.0 09/06/2017 0750   PLT 274 01/05/2017 1359   MCV 95.9 09/06/2017 0750   MCV 97.5 01/05/2017 1359   MCH 33.5 01/05/2017 1359   MCH 33.4 12/04/2016 1548   MCHC 33.8 09/06/2017 0750   RDW 13.4 09/06/2017 0750   RDW 12.4 01/05/2017 1359   LYMPHSABS 3.0 01/05/2017 1359   MONOABS 0.6 01/05/2017 1359   EOSABS 0.1 01/05/2017 1359   BASOSABS 0.0 01/05/2017 1359    No results found  for: POCLITH, LITHIUM   No results found for: PHENYTOIN, PHENOBARB, VALPROATE, CBMZ   .res Assessment: Plan:   Will continue current plan of care since pt reports that medications are controlling target s/s without tolerability issues.  Pt to f/u in 3 months or sooner if clinically indicated.  Patient advised to contact office with any questions, adverse effects, or acute worsening in signs and symptoms.  Corrie DandyMary was seen today for follow-up.  Diagnoses and all orders for this visit:  Generalized anxiety disorder -     ALPRAZolam (XANAX) 0.5 MG tablet; Take 1 tab po QHS and 1 tab po qd prn anxiety -     busPIRone (BUSPAR) 30 MG tablet; Take 1 tablet (30 mg total) by mouth 2 (two) times daily. -     sertraline (ZOLOFT) 100 MG tablet; Take 2 tablets (200 mg total) by mouth at bedtime.  Primary insomnia -     mirtazapine (REMERON) 15 MG tablet; Take 1 tablet (15 mg total) by mouth at bedtime.  MDD (recurrent major depressive disorder) -     mirtazapine (REMERON) 15 MG tablet; Take 1 tablet (15 mg total) by mouth at bedtime. -     sertraline (ZOLOFT) 100 MG tablet; Take 2  tablets (200 mg total) by mouth at bedtime.    Please see After Visit Summary for patient specific instructions.  Future Appointments  Date Time Provider Department Center  12/20/2018  3:30 PM Kirsteins, Victorino SparrowAndrew E, MD [facility]    No orders of the defined types were placed in this encounter.     -------------------------------

## 2018-12-20 ENCOUNTER — Other Ambulatory Visit: Payer: Self-pay

## 2018-12-20 ENCOUNTER — Encounter: Payer: PRIVATE HEALTH INSURANCE | Admitting: Physical Medicine & Rehabilitation

## 2018-12-20 ENCOUNTER — Telehealth: Payer: Self-pay

## 2018-12-20 ENCOUNTER — Other Ambulatory Visit: Payer: Self-pay | Admitting: Internal Medicine

## 2018-12-20 ENCOUNTER — Ambulatory Visit: Payer: PRIVATE HEALTH INSURANCE | Admitting: Physical Medicine & Rehabilitation

## 2018-12-20 DIAGNOSIS — Z20822 Contact with and (suspected) exposure to covid-19: Secondary | ICD-10-CM

## 2018-12-20 NOTE — Telephone Encounter (Signed)
Ptn came in today for appointment but presented with cold/sore throat like symptoms - I told her we will call her back with Telehealth appt for next week-I also made her aware of Covid testing at [facility] today.  She would like to talk to you on her telehealth call - I think it will be next Tuesday about changing medication or dosage - didn't get details as I asked her to explain to you on call.

## 2018-12-23 ENCOUNTER — Other Ambulatory Visit: Payer: Self-pay | Admitting: Physical Medicine & Rehabilitation

## 2018-12-23 ENCOUNTER — Other Ambulatory Visit: Payer: Self-pay

## 2018-12-23 ENCOUNTER — Encounter: Payer: Self-pay | Admitting: Physical Medicine & Rehabilitation

## 2018-12-23 ENCOUNTER — Encounter
Payer: PRIVATE HEALTH INSURANCE | Attending: Physical Medicine & Rehabilitation | Admitting: Physical Medicine & Rehabilitation

## 2018-12-23 VITALS — Temp 98.7°F | Ht 65.5 in | Wt 155.0 lb

## 2018-12-23 DIAGNOSIS — M2550 Pain in unspecified joint: Secondary | ICD-10-CM

## 2018-12-23 NOTE — Progress Notes (Addendum)
Subjective:    Patient ID: Faith Thomas, female    DOB: [birth year], 58 y.o.   MRN: [id number] Phone visit HPI CC: Widespread body pain  58 year old female with history of fibromyalgia syndrome who has complaints of increasing muscle aching.  Her exercise tolerance has diminished since February when she had the flu and also a bout of diarrhea which has persisted for several months.  She has undergone GI evaluation.  She was placed on antibiotics.  She had fairly extensive lab work performed.  Her CBC was normal stool PCR for bacterial and viral colitis was all normal.  Her celiac disease panel was normal.  TSH was normal.  Her creatinine was borderline at 1.01 with the upper limit of normal at 1.0.  Her GFR was normal.  Plasma homocystine levels were normal despite history of methylenetetrahydrofolate reductase mutation. The patient had a positive ANA as well as positive RNP antibodies hepatitis serologies were negative serologies for Sjogren's negative.  LFTs were normal except for a minimal elevation of ALT at 37 with upper limit of normal of 32 Other past medical history significant for pulmonary embolism and has been on chronic Eliquis  Patient complains that when she walks, she has increased muscle stiffness and soreness.  She does perform yoga for 45 minutes to an hour but cannot do this every day due to excessive soreness. Pain Inventory Average Pain 6 Pain Right Now 4 My pain is stabbing and aching  In the last 24 hours, has pain interfered with the following? General activity 5 Relation with others 5 Enjoyment of life 5 What TIME of day is your pain at its worst? daytime Sleep (in general) Good  Pain is worse with: walking, bending, inactivity, standing and some activites Pain improves with: rest, therapy/exercise and medication Relief from Meds: 8  Mobility walk without assistance ability to climb steps?  yes do you drive?  yes  Function employed # of hrs/week .  I need assistance with the following:  meal prep, household duties and shopping  Neuro/Psych weakness numbness tingling  Prior Studies Any changes since last visit?  no  Physicians involved in your care Any changes since last visit?  no   Family History  Problem Relation Age of Onset  . Arthritis Mother   . Hypertension Mother   . Mental illness Mother   . Anxiety disorder Mother   . Depression Mother   . Arthritis Father   . Hypertension Father   . Mental illness Father   . Anxiety disorder Father   . Depression Father   . Mental illness Brother   . ADD / ADHD Brother   . Psychosis Brother   . Post-traumatic stress disorder Brother   . Arthritis Daughter   . Autism Daughter   . Cancer Maternal Aunt   . Autism Maternal Aunt   . Hypertension Maternal Grandmother   . Arthritis Maternal Grandfather   . Depression Cousin   . Anxiety disorder Cousin   . Depression Maternal Aunt    Social History   Socioeconomic History  . Marital status: Divorced    Spouse name: Not on file  . Number of children: Not on file  . Years of education: Not on file  . Highest education level: Not on file  Occupational History  . Not on file  Social Needs  . Financial resource strain: Not on file  . Food insecurity    Worry: Not on file    Inability: Not on file  .  Transportation needs    Medical: Not on file    Non-medical: Not on file  Tobacco Use  . Smoking status: Never Smoker  . Smokeless tobacco: Never Used  Substance and Sexual Activity  . Alcohol use: Yes    Alcohol/week: 0.0 standard drinks    Comment: occasional   . Drug use: No  . Sexual activity: Not on file  Lifestyle  . Physical activity    Days per week: Not on file    Minutes per session: Not on file  . Stress: Not on file  Relationships  . Social Musicianconnections    Talks on phone: Not on file    Gets together: Not on file    Attends religious service: Not on file    Active member of club or organization:  Not on file    Attends meetings of clubs or organizations: Not on file    Relationship status: Not on file  Other Topics Concern  . 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 Past Medical History:  Diagnosis Date  . Allergy   . Depression   . DVT (deep venous thrombosis) (HCC)   . Fibromyalgia   . Genetic defect   . GERD (gastroesophageal reflux disease)   . History of IBS   . 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 Hypertension   . PE (pulmonary thromboembolism) (HCC)   . Positive TB test   . Raynaud disease   . Scoliosis   . Urine incontinence    Temp 98.7 F (37.1 C)   Ht 5' 5.5" (1.664 m)   Wt 155 lb (70.3 kg)   BMI 25.40 kg/m   Opioid Risk Score:   Fall Risk Score:  `1  Depression screen PHQ 2/9  [facility] 09/06/2017  Decreased Interest 3 0  Down, Depressed, Hopeless 3 2  PHQ - 2 Score 6 2     Review of Systems  Constitutional: Negative.   HENT: Negative.   Eyes: Negative.   Respiratory: Negative.   Cardiovascular: Negative.   Gastrointestinal: Positive for diarrhea.  Endocrine: Negative.   Genitourinary: Negative.   Musculoskeletal: Positive for arthralgias and myalgias.  Skin: Negative.   Allergic/Immunologic: Negative.   Neurological: Positive for weakness and numbness.  Hematological: Negative.   Psychiatric/Behavioral: Negative.   All other systems reviewed and are negative.      Objective:   Physical Exam  Deferred secondary to phone visit     Assessment & Plan:  #1.  Widespread body pain in a patient with fibromyalgia syndrome.  She was not examined today due to phone visit.  Previous examinations did not show any joint deformities or synovitis in the upper extremities or lower extremities. She had a rheumatology evaluation some years ago which did not find any evidence of  inflammatory arthritis.  She continues to complain of widespread body aches, she does have a positive RNP as well as ANA so I think it is reasonable for rheumatology reevaluation at an [facility].  She does have a hypercoagulable state with history of DVT/PE without any type of obvious risk factor  We discussed medication management for her complaints.  She is on high-dose sertraline but has tolerated twice daily 50 mg tramadol.  She is concerned about her elevated ALT and would like to reduce her Tylenol dose from 2 tablets  3 times a day We discussed that we can go up on the tramadol to 3 times daily and reduce the Tylenol. We also discussed that the elevation of ALT is minimal and there are no other liver enzymes that are elevated.  Therefore it is not absolutely necessary to do so.  We discussed that the serotonin syndrome is a concern given her high dose sertraline, we discussed the symptoms of this and the possible increased risk with going up on her tramadol from 50 mg twice daily to 50 mg 3 times daily.  We discussed that I would not recommend her to go up to 4 times daily  We discussed other alternatives such as nonsteroidal anti-inflammatories have more potential side effects and risks such as worsening renal status, gastric ulceration and interaction with anticoagulants.  Time spent with phone visit 19 minutes  Encourage face-to-face visit in 3 months

## 2018-12-23 NOTE — Patient Instructions (Signed)
Rheumatolgy referral made to [facility]

## 2018-12-24 MED ORDER — TRAMADOL HCL 50 MG PO TABS: 50.0000 mg | ORAL_TABLET | Freq: Three times a day (TID) | ORAL | 0 refills | Status: DC

## 2018-12-24 NOTE — Addendum Note (Signed)
Addended by: Charlett Blake on: 12/24/2018 02:09 PM   Modules accepted: Orders

## 2018-12-25 LAB — NOVEL CORONAVIRUS, NAA: SARS-CoV-2, NAA: NOT DETECTED

## 2019-01-16 ENCOUNTER — Other Ambulatory Visit: Payer: Self-pay | Admitting: Physical Medicine & Rehabilitation

## 2019-01-22 ENCOUNTER — Other Ambulatory Visit: Payer: Self-pay | Admitting: Physical Medicine & Rehabilitation

## 2019-02-12 ENCOUNTER — Other Ambulatory Visit: Payer: Self-pay

## 2019-02-12 DIAGNOSIS — F5101 Primary insomnia: Secondary | ICD-10-CM

## 2019-02-12 MED ORDER — TRAZODONE HCL 100 MG PO TABS: ORAL_TABLET | ORAL | 1 refills | Status: DC

## 2019-02-20 ENCOUNTER — Ambulatory Visit: Payer: PRIVATE HEALTH INSURANCE | Admitting: Psychiatry

## 2019-02-20 ENCOUNTER — Other Ambulatory Visit: Payer: Self-pay | Admitting: Physical Medicine & Rehabilitation

## 2019-03-03 ENCOUNTER — Ambulatory Visit (INDEPENDENT_AMBULATORY_CARE_PROVIDER_SITE_OTHER): Payer: PRIVATE HEALTH INSURANCE | Admitting: Psychiatry

## 2019-03-03 ENCOUNTER — Other Ambulatory Visit: Payer: Self-pay

## 2019-03-03 ENCOUNTER — Encounter: Payer: Self-pay | Admitting: Psychiatry

## 2019-03-03 DIAGNOSIS — F411 Generalized anxiety disorder: Secondary | ICD-10-CM | POA: Diagnosis not present

## 2019-03-03 DIAGNOSIS — F5101 Primary insomnia: Secondary | ICD-10-CM

## 2019-03-03 DIAGNOSIS — F3340 Major depressive disorder, recurrent, in remission, unspecified: Secondary | ICD-10-CM

## 2019-03-03 MED ORDER — ALPRAZOLAM 0.5 MG PO TABS: ORAL_TABLET | ORAL | 2 refills | Status: DC

## 2019-03-03 NOTE — Progress Notes (Signed)
Faith Thomas [id number] November 09, 1960 58 y.o.  Subjective:   Patient ID:  Faith Thomas is a 58 y.o. (DOB Aug 11, 1960) female.  Chief Complaint:  Chief Complaint  Patient presents with  . Follow-up    Anxiety, Depression, Insomnia    HPI Faith Thomas presents to the office today for follow-up of anxiety and depression. She has noticed that the more pain she has, the more irritability she has. She reports that when she falls asleep she is able to sleep soundly but has difficulty with sleep initiation and often needs to take Xanax for sleep initiation. She reports that she is anxious about pain worsening in the winter and also having flu again. She reports that overall anxiety has been "not too bad" with the exception of anxiety being easily triggered some days. Reports that she typically has anxiety in response to certain ethical issues, family stressors, etc. Denies panic s/s. Reports that she is more aware of anxiety triggers. Denies depressed mood. Energy and motivation vary from very low to adequate. Able to complete what is needed for work. Concentration has been adequate. Appetite has been affected by GI s/s. Denies SI.   She reports that she has continued to have some GI s/s (diarrhea) since having the flu in February. She noticed that when her GI s/s were worse that her myalgias were worse. Was referred to rheumatologist.   Working Tues- Friday 2pm- midnight. She is working remotely from home 100%. She reports that this schedule has helped her sleep since she can wake up naturally and not worry about over sleeping or not getting enough sleep before start of work.   Past Medication Trials: Buspar-Effective Trintellix- memory difficulties Sertraline- Effective Cymbalta- memory difficulties Prozac-stomach pain  Paxil Celexa Lexapro Remeron- Effective Wellbutrin XL- Irritability Lamictal- ineffective Klonopin Xanax Doxepin Trazodone- stopped  working [place]  Review of Systems:  Review of Systems  Gastrointestinal: Positive for diarrhea.  Musculoskeletal: Positive for arthralgias and myalgias. Negative for gait problem.  Neurological: Negative for tremors.  Psychiatric/Behavioral:       Please refer to HPI    Medications: I have reviewed the patient's current medications.  Current Outpatient Medications  Medication Sig Dispense Refill  . acetaminophen (TYLENOL) 500 MG tablet Take 1,000 mg by mouth every 8 (eight) hours as needed.     Melene Muller ON 03/17/2019] ALPRAZolam (XANAX) 0.5 MG tablet Take 1 tab po QHS and 1 tab po qd prn anxiety 60 tablet 2  . apixaban (ELIQUIS) 5 MG TABS tablet Take 1 tablet (5 mg total) by mouth 2 (two) times daily. 180 tablet 1  . b complex vitamins capsule Take 1 capsule by mouth daily.    . busPIRone (BUSPAR) 30 MG tablet Take 1 tablet (30 mg total) by mouth 2 (two) times daily. 180 tablet 1  . busPIRone (BUSPAR) 30 MG tablet Take 1 tablet (30 mg total) by mouth 2 (two) times daily. 180 tablet 1  . Cholecalciferol (VITAMIN D-3) 1000 units CAPS Take 10,000 Units by mouth daily.     Marland Kitchen ELIQUIS 5 MG TABS tablet TAKE 1 TABLET (5 MG TOTAL) BY MOUTH 2 (TWO) TIMES DAILY WITH A MEAL. 60 tablet 0  . estradiol (ESTRACE) 0.1 MG/GM vaginal cream Place 1 Applicatorful vaginally at bedtime.    Marland Kitchen KRILL OIL PO Take by mouth.    Marland Kitchen L-Methylfolate-Algae (DEPLIN 15) 15-90.314 MG CAPS TAKE 1 CAPSULE BY MOUTH EVERY DAY 90 capsule 2  . lisinopril (PRINIVIL,ZESTRIL) 10 MG tablet TAKE 1  TABLET BY MOUTH EVERY DAY 34 tablet 2  . methocarbamol (ROBAXIN) 500 MG tablet TAKE 1 TABLET(500 MG) BY MOUTH THREE TIMES DAILY AS NEEDED FOR MUSCLE SPASMS 45 tablet 0  . Methylcobalamin (B-12) 5000 MCG TBDP Inject 0.1 mLs into the muscle once a week.     . mometasone (NASONEX) 50 MCG/ACT nasal spray USE ONE SPRAY IN EACH NOSTRIL ONE TIME DAILY (Patient taking differently: daily as needed. ) 17 g 5  . Probiotic Product (PROBIOTIC PO) Take 1  capsule by mouth daily.     . sucralfate (CARAFATE) 1 g tablet TK 1 T PO BID    . traMADol (ULTRAM) 50 MG tablet TAKE 1 TABLET(50 MG) BY MOUTH THREE TIMES DAILY 90 tablet 2  . vitamin C (ASCORBIC ACID) 500 MG tablet Take 1,000 mg by mouth daily.     . mirtazapine (REMERON) 15 MG tablet Take 1 tablet (15 mg total) by mouth at bedtime. 90 tablet 1  . sertraline (ZOLOFT) 100 MG tablet Take 2 tablets (200 mg total) by mouth at bedtime. 180 tablet 1   No current facility-administered medications for this visit.     Medication Side Effects: None  Allergies:  Allergies  Allergen Reactions  . Citalopram Tinitus  . Escitalopram Tinitus  . Bee Venom Swelling  . Latex Swelling  . Penicillins Swelling and Rash    Has patient had a PCN reaction causing immediate rash, facial/tongue/throat swelling, SOB or lightheadedness with hypotension: Yes Has patient had a PCN reaction causing severe rash involving mucus membranes or skin necrosis: No Has patient had a PCN reaction that required hospitalization: No Has patient had a PCN reaction occurring within the last 10 years: No If all of the above answers are "NO", then may proceed with Cephalosporin use.  Marland Kitchen Cymbalta [Duloxetine Hcl]     -memory loss  . Escitalopram Oxalate Tinitus  . Other     Yeast creams- causes secondary infections, itching, burning, redness   . Progesterone     Pt feels caused weakness, fatigue, cognitive deficits  . Trintellix [Vortioxetine]     Memory problems  . Bupropion Other (See Comments)    weakness and malaise.  . Duloxetine Other (See Comments)    Past Medical History:  Diagnosis Date  . Allergy   . Depression   . DVT (deep venous thrombosis) (HCC)   . Fibromyalgia   . Genetic defect   . GERD (gastroesophageal reflux disease)   . History of IBS   . 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 Hypertension   . PE (pulmonary thromboembolism) (HCC)   . Positive TB test   . Raynaud disease   .  Scoliosis   . Urine incontinence     Family History  Problem Relation Age of Onset  . Arthritis Mother   . Hypertension Mother   . Mental illness Mother   . Anxiety disorder Mother   . Depression Mother   . Arthritis Father   . Hypertension Father   . Mental illness Father   . Anxiety disorder Father   . Depression Father   . Mental illness Brother   . ADD / ADHD Brother   . Psychosis Brother   . Post-traumatic stress disorder Brother   . Arthritis Daughter   . Autism Daughter   . Cancer Maternal Aunt   . Autism Maternal Aunt   . Hypertension Maternal Grandmother   . Arthritis Maternal Grandfather   . Depression Cousin   . Anxiety disorder  Cousin   . Depression Maternal Aunt     Social History   Socioeconomic History  . Marital status: Divorced    Spouse name: Not on file  . Number of children: Not on file  . Years of education: Not on file  . Highest education level: Not on file  Occupational History  . Not on file  Social Needs  . Financial resource strain: Not on file  . Food insecurity    Worry: Not on file    Inability: Not on file  . Transportation needs    Medical: Not on file    Non-medical: Not on file  Tobacco Use  . Smoking status: Never Smoker  . Smokeless tobacco: Never Used  Substance and Sexual Activity  . Alcohol use: Yes    Alcohol/week: 0.0 standard drinks    Comment: occasional   . Drug use: No  . Sexual activity: Not on file  Lifestyle  . Physical activity    Days per week: Not on file    Minutes per session: Not on file  . Stress: Not on file  Relationships  . Social Herbalist on phone: Not on file    Gets together: Not on file    Attends religious service: Not on file    Active member of club or organization: Not on file    Attends meetings of clubs or organizations: Not on file    Relationship status: Not on file  . Intimate partner violence    Fear of current or ex partner: Not on file    Emotionally abused:  Not on file    Physically abused: Not on file    Forced sexual activity: Not on file  Other Topics Concern  . 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   Please see review of systems for further details on the patient's review from today.   Objective:   Physical Exam:  There were no vitals taken for this visit.  Physical Exam Constitutional:      General: She is not in acute distress.    Appearance: She is well-developed.  Musculoskeletal:        General: No deformity.  Neurological:     Mental Status: She is alert and oriented to person, place, and time.     Coordination: Coordination normal.  Psychiatric:        Attention and Perception: Attention and perception normal. She does not perceive auditory or visual hallucinations.        Mood and Affect: Mood is not depressed. Affect is not labile, blunt, angry or inappropriate.        Speech: Speech normal.        Behavior: Behavior normal.        Thought Content: Thought content normal. Thought content is not paranoid or delusional. Thought content does not include homicidal or suicidal ideation. Thought content does not include homicidal or suicidal plan.        Cognition and Memory: Cognition and memory normal.        Judgment: Judgment normal.     Comments: Mood is appropriate to content.  Affect is congruent. Insight intact. No delusions.      Lab Review:     Component Value Date/Time   NA 137 09/06/2017 0750  NA 134 (L) 01/05/2017 1359   K 4.5 09/06/2017 0750   K 3.9 01/05/2017 1359   CL 100 09/06/2017 0750   CO2 29 09/06/2017 0750   CO2 25 01/05/2017 1359   GLUCOSE 81 09/06/2017 0750   GLUCOSE 106 01/05/2017 1359   BUN 17 09/06/2017 0750   BUN 10.2 01/05/2017 1359   CREATININE 0.85 09/06/2017 0750    CREATININE 0.8 01/05/2017 1359   CALCIUM 9.7 09/06/2017 0750   CALCIUM 9.5 01/05/2017 1359   PROT 6.4 01/05/2017 1359   ALBUMIN 4.2 01/05/2017 1359   AST 18 01/05/2017 1359   ALT 15 01/05/2017 1359   ALKPHOS 60 01/05/2017 1359   BILITOT 0.22 01/05/2017 1359   GFRNONAA >60 12/04/2016 1548   GFRAA >60 12/04/2016 1548       Component Value Date/Time   WBC 5.2 09/06/2017 0750   RBC 4.29 09/06/2017 0750   HGB 13.9 09/06/2017 0750   HGB 13.4 01/05/2017 1359   HCT 41.1 09/06/2017 0750   HCT 39.0 01/05/2017 1359   PLT 287.0 09/06/2017 0750   PLT 274 01/05/2017 1359   MCV 95.9 09/06/2017 0750   MCV 97.5 01/05/2017 1359   MCH 33.5 01/05/2017 1359   MCH 33.4 12/04/2016 1548   MCHC 33.8 09/06/2017 0750   RDW 13.4 09/06/2017 0750   RDW 12.4 01/05/2017 1359   LYMPHSABS 3.0 01/05/2017 1359   MONOABS 0.6 01/05/2017 1359   EOSABS 0.1 01/05/2017 1359   BASOSABS 0.0 01/05/2017 1359    No results found for: POCLITH, LITHIUM   No results found for: PHENYTOIN, PHENOBARB, VALPROATE, CBMZ   .res Assessment: Plan:   Patient seen for 30 minutes and greater than 50% of visit spent counseling patient and coordination of care to include discussing recent visits with medical specialists and reviewing office note from rheumatology visit.  Discussed starting Lyrica in combination with current medications as recommended by rheumatologist.  Discussed considering reducing medications in anticipation of starting Lyrica.  Reviewed current medications and agree that sertraline has been helpful for her mood and anxiety, BuSpar has been very helpful for generalized anxiety, and there was a significant improvement in patient's mood and insomnia after starting mirtazapine.  Benefit with trazodone is unclear and trazodone may have been helpful initially and then less effective over time.  Discussed discontinuing trazodone due to unclear response and patient is in agreement.  Discussed that Lyrica may improve insomnia  and trazodone may not be needed. Will continue all other medications as prescribed. Patient to follow-up in 6 to 8 weeks or sooner if clinically indicated. Patient advised to contact office with any questions, adverse effects, or acute worsening in signs and symptoms.  Faith DandyMary was seen today for follow-up.  Diagnoses and all orders for this visit:  Generalized anxiety disorder -     ALPRAZolam (XANAX) 0.5 MG tablet; Take 1 tab po QHS and 1 tab po qd prn anxiety  Primary insomnia  MDD (recurrent major depressive disorder)     Please see After Visit Summary for patient specific instructions.  Future Appointments  Date Time Provider Department Center  03/25/2019  8:15 AM Erick ColaceKirsteins, Andrew E, MD [facility]  04/21/2019  2:00 PM Corie Chiquitoarter, Alenah Sarria, PMHNP [facility] None    No orders of the defined types were placed in this encounter.   -------------------------------

## 2019-03-10 ENCOUNTER — Other Ambulatory Visit: Payer: Self-pay | Admitting: Physical Medicine & Rehabilitation

## 2019-03-11 ENCOUNTER — Telehealth: Payer: Self-pay | Admitting: Psychiatry

## 2019-03-11 NOTE — Telephone Encounter (Signed)
Faith Thomas called wanting to discuss her Lyrica.  On a positive note it is dramatically reducing her pain so she is also experiencing more energy.  On the downside she is feeling more hypomanic.  She thinks maybe the dose is too strong.  Should she reduce the dose?  Please call to discuss.  Next appt 11/16

## 2019-03-17 ENCOUNTER — Other Ambulatory Visit: Payer: Self-pay | Admitting: Psychiatry

## 2019-03-17 DIAGNOSIS — F411 Generalized anxiety disorder: Secondary | ICD-10-CM

## 2019-03-17 NOTE — Telephone Encounter (Signed)
Next appt 11/16

## 2019-03-25 ENCOUNTER — Encounter: Payer: PRIVATE HEALTH INSURANCE | Admitting: Physical Medicine & Rehabilitation

## 2019-04-15 ENCOUNTER — Encounter: Payer: Self-pay | Admitting: Physical Medicine & Rehabilitation

## 2019-04-15 ENCOUNTER — Encounter
Payer: PRIVATE HEALTH INSURANCE | Attending: Physical Medicine & Rehabilitation | Admitting: Physical Medicine & Rehabilitation

## 2019-04-15 ENCOUNTER — Other Ambulatory Visit: Payer: Self-pay

## 2019-04-15 VITALS — BP 109/75 | HR 104 | Temp 97.7°F | Ht 65.0 in | Wt 153.4 lb

## 2019-04-15 DIAGNOSIS — G8929 Other chronic pain: Secondary | ICD-10-CM

## 2019-04-15 DIAGNOSIS — M797 Fibromyalgia: Secondary | ICD-10-CM | POA: Insufficient documentation

## 2019-04-15 DIAGNOSIS — M47816 Spondylosis without myelopathy or radiculopathy, lumbar region: Secondary | ICD-10-CM | POA: Insufficient documentation

## 2019-04-15 DIAGNOSIS — M5442 Lumbago with sciatica, left side: Secondary | ICD-10-CM | POA: Diagnosis present

## 2019-04-15 NOTE — Patient Instructions (Signed)
Continue yoga and meditation for stress relief.  Continue walking daily. Try to gradually increase walking to 15 minutes per day. Can consider the use of Amitriptyline for neuropathic pain. Patient would like to discuss with her psychiatrist at first.

## 2019-04-15 NOTE — Progress Notes (Signed)
Subjective:    Patient ID: Faith Thomas, female    DOB: April 27, 1961, 58 y.o.   MRN: [id number]  HPI Faith Thomas returns for follow-up of her right sided back pain that radiates into her leg.  She continues to feel her lower back and right leg pain especially with activities such as vaccuming and mopping. It also limits her walking ability-she now walks about 12 minutes outside daily. She has had relief from the Robaxin which she takes regularly 3 times per day. She had hypomania when she tried Lyrica, a reaction to Cymbalta, and has never tried Amitriptyline. She is not interested in injections or surgical options.   Overall, she feels that he life is improved from the same time last year. She is very appreciative of her excellent medical care team. She has been doing daily yoga and meditation for stress relief. She has started a new job with the hours of 2 to midnight and she feels her sleep is better as a result since she can sleep in as long as she needs to in the morning. She does still have som difficulty falling asleep at night.    Pain Inventory Average Pain 4 Pain Right Now 6 My pain is constant, sharp, burning, dull and aching  In the last 24 hours, has pain interfered with the following? General activity 7 Relation with others 8 Enjoyment of life 5 What TIME of day is your pain at its worst? morning and evening Sleep (in general) Good  Pain is worse with: bending, standing and some activites Pain improves with: rest, heat/ice, therapy/exercise, pacing activities and medication Relief from Meds: 7  Mobility walk without assistance how many minutes can you walk? 15 ability to climb steps?  yes do you drive?  yes  Function employed # of hrs/week 40 I need assistance with the following:  meal prep, household duties and shopping  Neuro/Psych weakness spasms confusion anxiety  Prior Studies Any changes since last visit?  no x-rays nerve study   Physicians involved in your care Primary care Dr.Shah Psychiatrist Dr. Loreta Ave Rheumatologist .   Family History  Problem Relation Age of Onset  . Arthritis Mother   . Hypertension Mother   . Mental illness Mother   . Anxiety disorder Mother   . Depression Mother   . Arthritis Father   . Hypertension Father   . Mental illness Father   . Anxiety disorder Father   . Depression Father   . Mental illness Brother   . ADD / ADHD Brother   . Psychosis Brother   . Post-traumatic stress disorder Brother   . Arthritis Daughter   . Autism Daughter   . Cancer Maternal Aunt   . Autism Maternal Aunt   . Hypertension Maternal Grandmother   . Arthritis Maternal Grandfather   . Depression Cousin   . Anxiety disorder Cousin   . Depression Maternal Aunt    Social History   Socioeconomic History  . Marital status: Divorced    Spouse name: Not on file  . Number of children: Not on file  . Years of education: Not on file  . Highest education level: Not on file  Occupational History  . Not on file  Social Needs  . Financial resource strain: Not on file  . Food insecurity    Worry: Not on file    Inability: Not on file  . Transportation needs    Medical: Not on file    Non-medical: Not on file  Tobacco Use  . Smoking status: Never Smoker  . Smokeless tobacco: Never Used  Substance and Sexual Activity  . Alcohol use: Yes    Alcohol/week: 0.0 standard drinks    Comment: occasional   . Drug use: No  . Sexual activity: Not on file  Lifestyle  . Physical activity    Days per week: Not on file    Minutes per session: Not on file  . Stress: Not on file  Relationships  . Social Musicianconnections    Talks on phone: Not on file    Gets together: Not on file    Attends religious service: Not on file    Active member of club or organization: Not on file    Attends meetings of clubs or organizations: Not on file    Relationship status: Not on file  Other Topics Concern  . 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 Past Medical History:  Diagnosis Date  . Allergy   . Depression   . DVT (deep venous thrombosis) (HCC)   . Fibromyalgia   . Genetic defect   . GERD (gastroesophageal reflux disease)   . History of IBS   . 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 Hypertension   . PE (pulmonary thromboembolism) (HCC)   . Positive TB test   . Raynaud disease   . Scoliosis   . Urine incontinence    BP 109/75   Pulse (!) 104   Temp 97.7 F (36.5 C)   Ht 5\' 5"  (1.651 m)   Wt 153 lb 6.4 oz (69.6 kg)   SpO2 95%   BMI 25.53 kg/m   Opioid Risk Score:   Fall Risk Score:  `1  Depression screen PHQ 2/9  [facility] 04/15/2019 01/07/2018 09/06/2017  Decreased Interest 0 3 0  Down, Depressed, Hopeless 0 3 2  PHQ - 2 Score 0 6 2    Review of Systems  Constitutional: Positive for unexpected weight change.  Gastrointestinal: Positive for abdominal pain and diarrhea.  Musculoskeletal:       Spasms  Psychiatric/Behavioral: Positive for confusion. The patient is nervous/anxious.   All other systems reviewed and are negative.      Objective:   Physical Exam Gen: no distress, normal appearing HEENT: oral mucosa pink and moist, NCAT Cardio: Reg rate Chest: normal effort, normal rate of breathing Abd: soft, non-distended Ext: no edema Skin: intact Neuro/Musculoskeletal: Normal gait. FROM upon flexion, pain reproduced in bilateral low back upon extension. 5/5 motor strength throughout. Negative SLR and Slump tests. Sensation intact throughout.  Psych: pleasant, normal affect       Assessment & Plan:  1) Bilateral lower back pain with left sided sciatica 2) Bilateral facet joint arthritis -Faith Thomas's chief complaint today is lower back pain with left sided sciatica in the L5 distribution. On physical exam, she has  painless FROM on flexion with her bilateral low back pain reproduced on extension. This is most consistent with L5 nerve root impingement and facet arthropathy, which is consistent with her 2017 lumbar spine MRI, reviewed by me today. She would benefit and is agreeable to a course of physical therapy focused on core strengthening. She would like to hold off on starting this until COVID-19 cases have reduced. For now, she will continue her daily yoga practice, which has been shown to help  low back pain. She is not interested in injections at this time, but is possibly interested in starting Amitriptyline for her neuropathic pain--she plans to discuss with her psychiatrist.  3) Fibromyalgia -Patient's symptoms have greatly improved with her change in job, allowing her to sleep better. Continue daily yoga and meditation for stress relief. Discussed the goal of increasing her aerobic exercise to 15 minutes of daily outdoor walking for now.   --Follow up in 4 months for continued management of her lower back pain and fibromyalgia.  Thirty minutes of face to face patient care time were spent during this visit. All questions were encouraged and answered.

## 2019-04-21 ENCOUNTER — Ambulatory Visit: Payer: PRIVATE HEALTH INSURANCE | Admitting: Psychiatry

## 2019-04-22 ENCOUNTER — Other Ambulatory Visit: Payer: Self-pay | Admitting: Physical Medicine & Rehabilitation

## 2019-04-24 ENCOUNTER — Other Ambulatory Visit: Payer: Self-pay | Admitting: Physical Medicine & Rehabilitation

## 2019-05-07 ENCOUNTER — Other Ambulatory Visit: Payer: Self-pay | Admitting: Psychiatry

## 2019-05-07 DIAGNOSIS — F5101 Primary insomnia: Secondary | ICD-10-CM

## 2019-05-07 DIAGNOSIS — F3340 Major depressive disorder, recurrent, in remission, unspecified: Secondary | ICD-10-CM

## 2019-05-12 ENCOUNTER — Encounter: Payer: Self-pay | Admitting: Psychiatry

## 2019-05-12 ENCOUNTER — Ambulatory Visit (INDEPENDENT_AMBULATORY_CARE_PROVIDER_SITE_OTHER): Payer: PRIVATE HEALTH INSURANCE | Admitting: Psychiatry

## 2019-05-12 ENCOUNTER — Other Ambulatory Visit: Payer: Self-pay

## 2019-05-12 DIAGNOSIS — F5101 Primary insomnia: Secondary | ICD-10-CM

## 2019-05-12 DIAGNOSIS — F411 Generalized anxiety disorder: Secondary | ICD-10-CM | POA: Diagnosis not present

## 2019-05-12 DIAGNOSIS — F3340 Major depressive disorder, recurrent, in remission, unspecified: Secondary | ICD-10-CM

## 2019-05-12 MED ORDER — ALPRAZOLAM 0.5 MG PO TABS: ORAL_TABLET | ORAL | 1 refills | Status: DC

## 2019-05-12 MED ORDER — BUSPIRONE HCL 30 MG PO TABS: 30.0000 mg | ORAL_TABLET | Freq: Two times a day (BID) | ORAL | 1 refills | Status: DC

## 2019-05-12 MED ORDER — SERTRALINE HCL 100 MG PO TABS: 200.0000 mg | ORAL_TABLET | Freq: Every day | ORAL | 1 refills | Status: DC

## 2019-05-12 MED ORDER — MIRTAZAPINE 15 MG PO TABS: ORAL_TABLET | ORAL | 0 refills | Status: DC

## 2019-05-12 NOTE — Progress Notes (Signed)
Aris Moman [id number] 08-21-1960 58 y.o.  Subjective:   Patient ID:  Faith Thomas is a 58 y.o. (DOB August 07, 1960) female.  Chief Complaint:  Chief Complaint  Patient presents with  . Anxiety  . Depression    HPI Faith Thomas presents to the office today for follow-up of anxiety, depression, and insomnia.  She reports that her father has been declining cognitively and was a victim of fraud. She has been having to help him and father was staying with her part-time. She reports that she had been doing well until father came to see her and then started drinking more(about 3 glasses a night for 8 consecutive days) which resulted in increased GI s/s. She reports that she stopped drinking recently. Had increased anxiety with assisting her father and also had less time for her self-care to include stress management strategies and sleep hygiene. Father is now staying with her brother and this has helped some with her anxiety. Denies any recent panic attacks.   "I'm not so much depressed as I am angry." She reports that she has been taking 2 Alprazolam tabs to help with sleep. She reports that she would take one Xanax tab and then not able to fall asleep, and took additional Xanax when unable to sleep after 45 minutes. She reports that she is eating more in the evening with larger portions and attributes this to "self-soothing." Reports period of lower energy and motivation and it has improved slightly. Energy remains low overall. Concentration has been ok and is occasionally forgetting some items. Denies SI.   Previous therapist relocated and is going to start with a new therapist, Aquilla Solian, [facility] that does EMDR and yoga.   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   Past Medication Trials: Buspar-Effective Trintellix- memory difficulties Sertraline- Effective Cymbalta- memory  difficulties Prozac-stomach pain  Paxil Celexa Lexapro Remeron- Effective Wellbutrin XL- Irritability Lamictal- ineffective Klonopin Xanax Doxepin Trazodone- stopped working Deplin Lyrica- hypomania   Review of Systems:  Review of Systems  Gastrointestinal: Positive for abdominal pain.       Increased GI s/s to include reflux  Musculoskeletal: Positive for arthralgias and back pain. Negative for gait problem.  Neurological: Negative for tremors.  Psychiatric/Behavioral:       Please refer to HPI    Medications: I have reviewed the patient's current medications.  Current Outpatient Medications  Medication Sig Dispense Refill  . acetaminophen (TYLENOL) 500 MG tablet Take 1,000 mg by mouth every 8 (eight) hours as needed.     . ALPRAZolam (XANAX) 0.5 MG tablet TAKE 1 TAB PO QHS AND 1/2-1 TABLET BY MOUTH TWICE DAILY AS NEEDED FOR ANXIETY 75 tablet 1  . apixaban (ELIQUIS) 5 MG TABS tablet Take 1 tablet (5 mg total) by mouth 2 (two) times daily. 180 tablet 1  . b complex vitamins capsule Take 1 capsule by mouth daily.    . busPIRone (BUSPAR) 30 MG tablet Take 1 tablet (30 mg total) by mouth 2 (two) times daily. 180 tablet 1  . busPIRone (BUSPAR) 30 MG tablet Take 1 tablet (30 mg total) by mouth 2 (two) times daily. 180 tablet 1  . Cholecalciferol (VITAMIN D-3) 1000 units CAPS Take 10,000 Units by mouth daily.     Marland Kitchen ELIQUIS 5 MG TABS tablet TAKE 1 TABLET (5 MG TOTAL) BY MOUTH 2 (TWO) TIMES DAILY WITH A MEAL. 60 tablet 0  . L-Methylfolate-Algae (DEPLIN 15) 15-90.314  MG CAPS TAKE 1 CAPSULE BY MOUTH EVERY DAY 90 capsule 2  . lisinopril (PRINIVIL,ZESTRIL) 10 MG tablet TAKE 1 TABLET BY MOUTH EVERY DAY 34 tablet 2  . MAGNESIUM PO Take by mouth.    . methocarbamol (ROBAXIN) 500 MG tablet TAKE 1 TABLET(500 MG) BY MOUTH THREE TIMES DAILY AS NEEDED FOR MUSCLE SPASMS 45 tablet 2  . Methylcobalamin (B-12) 5000 MCG TBDP Inject 0.1 mLs into the muscle once a week.     . mirtazapine (REMERON) 15  MG tablet TAKE 1 TABLET(15 MG) BY MOUTH AT BEDTIME 90 tablet 0  . Omega-3 Fatty Acids (OMEGA 3 PO) Take by mouth.    Marland Kitchen PEPPERMINT OIL PO Take by mouth.    . Probiotic Product (PROBIOTIC PO) Take 1 capsule by mouth daily.     . sucralfate (CARAFATE) 1 g tablet TK 1 T PO BID    . traMADol (ULTRAM) 50 MG tablet TAKE 1 TABLET(50 MG) BY MOUTH THREE TIMES DAILY 90 tablet 2  . vitamin C (ASCORBIC ACID) 500 MG tablet Take 1,000 mg by mouth daily.     Marland Kitchen estradiol (ESTRACE) 0.1 MG/GM vaginal cream Place 1 Applicatorful vaginally at bedtime.    . mometasone (NASONEX) 50 MCG/ACT nasal spray USE ONE SPRAY IN EACH NOSTRIL ONE TIME DAILY (Patient taking differently: daily as needed. ) 17 g 5  . sertraline (ZOLOFT) 100 MG tablet Take 2 tablets (200 mg total) by mouth at bedtime. 180 tablet 1   No current facility-administered medications for this visit.     Medication Side Effects: None  Allergies:  Allergies  Allergen Reactions  . Citalopram Tinitus  . Escitalopram Tinitus  . Bee Venom Swelling  . Latex Swelling  . Penicillins Swelling and Rash    Has patient had a PCN reaction causing immediate rash, facial/tongue/throat swelling, SOB or lightheadedness with hypotension: Yes Has patient had a PCN reaction causing severe rash involving mucus membranes or skin necrosis: No Has patient had a PCN reaction that required hospitalization: No Has patient had a PCN reaction occurring within the last 10 years: No If all of the above answers are "NO", then may proceed with Cephalosporin use.  Marland Kitchen Cymbalta [Duloxetine Hcl]     -memory loss  . Escitalopram Oxalate Tinitus  . Other     Yeast creams- causes secondary infections, itching, burning, redness   . Progesterone     Pt feels caused weakness, fatigue, cognitive deficits  . Trintellix [Vortioxetine]     Memory problems  . Bupropion Other (See Comments)    weakness and malaise.  . Duloxetine Other (See Comments)    Past Medical History:  Diagnosis  Date  . Allergy   . Depression   . DVT (deep venous thrombosis) ([place])   . Fibromyalgia   . Genetic defect   . GERD (gastroesophageal reflux disease)   . History of IBS   . 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 Hypertension   . PE (pulmonary thromboembolism) ([place])   . Positive TB test   . Raynaud disease   . Scoliosis   . Urine incontinence     Family History  Problem Relation Age of Onset  . Arthritis Mother   . Hypertension Mother   . Mental illness Mother   . Anxiety disorder Mother   . Depression Mother   . Arthritis Father   . Hypertension Father   . Mental illness Father   . Anxiety disorder Father   . Depression Father   .  Mental illness Brother   . ADD / ADHD Brother   . Psychosis Brother   . Post-traumatic stress disorder Brother   . Arthritis Daughter   . Autism Daughter   . Cancer Maternal Aunt   . Autism Maternal Aunt   . Hypertension Maternal Grandmother   . Arthritis Maternal Grandfather   . Depression Cousin   . Anxiety disorder Cousin   . Depression Maternal Aunt     Social History   Socioeconomic History  . Marital status: Divorced    Spouse name: Not on file  . Number of children: Not on file  . Years of education: Not on file  . Highest education level: Not on file  Occupational History  . Not on file  Social Needs  . Financial resource strain: Not on file  . Food insecurity    Worry: Not on file    Inability: Not on file  . Transportation needs    Medical: Not on file    Non-medical: Not on file  Tobacco Use  . Smoking status: Never Smoker  . Smokeless tobacco: Never Used  Substance and Sexual Activity  . Alcohol use: Yes    Alcohol/week: 0.0 standard drinks    Comment: occasional   . Drug use: No  . Sexual activity: Not on file  Lifestyle  . Physical activity    Days per week: Not on file    Minutes per session: Not on file  . Stress: Not on file  Relationships  . Social Musicianconnections    Talks on phone:  Not on file    Gets together: Not on file    Attends religious service: Not on file    Active member of club or organization: Not on file    Attends meetings of clubs or organizations: Not on file    Relationship status: Not on file  . Intimate partner violence    Fear of current or ex partner: Not on file    Emotionally abused: Not on file    Physically abused: Not on file    Forced sexual activity: Not on file  Other Topics Concern  . 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   Please see review of systems for further details on the patient's review from today.   Objective:   Physical Exam:  There were no vitals taken for this visit.  Physical Exam Constitutional:      General: She is not in acute distress.    Appearance: She is well-developed.  Musculoskeletal:        General: No deformity.  Neurological:     Mental Status: She is alert and oriented to person, place, and time.     Coordination: Coordination normal.  Psychiatric:        Attention and Perception: Attention and perception normal. She does not perceive auditory or visual hallucinations.        Mood and Affect: Mood is anxious. Mood is not depressed. Affect is not labile, blunt, angry or inappropriate.        Speech: Speech normal.        Behavior: Behavior normal.        Thought Content: Thought content normal. Thought content is not paranoid or delusional. Thought content does not include homicidal or suicidal ideation. Thought content  does not include homicidal or suicidal plan.        Cognition and Memory: Cognition and memory normal.        Judgment: Judgment normal.     Comments: Insight intact. No delusions.      Lab Review:     Component Value Date/Time   NA 137 09/06/2017 0750   NA 134 (L)  01/05/2017 1359   K 4.5 09/06/2017 0750   K 3.9 01/05/2017 1359   CL 100 09/06/2017 0750   CO2 29 09/06/2017 0750   CO2 25 01/05/2017 1359   GLUCOSE 81 09/06/2017 0750   GLUCOSE 106 01/05/2017 1359   BUN 17 09/06/2017 0750   BUN 10.2 01/05/2017 1359   CREATININE 0.85 09/06/2017 0750   CREATININE 0.8 01/05/2017 1359   CALCIUM 9.7 09/06/2017 0750   CALCIUM 9.5 01/05/2017 1359   PROT 6.4 01/05/2017 1359   ALBUMIN 4.2 01/05/2017 1359   AST 18 01/05/2017 1359   ALT 15 01/05/2017 1359   ALKPHOS 60 01/05/2017 1359   BILITOT 0.22 01/05/2017 1359   GFRNONAA >60 12/04/2016 1548   GFRAA >60 12/04/2016 1548       Component Value Date/Time   WBC 5.2 09/06/2017 0750   RBC 4.29 09/06/2017 0750   HGB 13.9 09/06/2017 0750   HGB 13.4 01/05/2017 1359   HCT 41.1 09/06/2017 0750   HCT 39.0 01/05/2017 1359   PLT 287.0 09/06/2017 0750   PLT 274 01/05/2017 1359   MCV 95.9 09/06/2017 0750   MCV 97.5 01/05/2017 1359   MCH 33.5 01/05/2017 1359   MCH 33.4 12/04/2016 1548   MCHC 33.8 09/06/2017 0750   RDW 13.4 09/06/2017 0750   RDW 12.4 01/05/2017 1359   LYMPHSABS 3.0 01/05/2017 1359   MONOABS 0.6 01/05/2017 1359   EOSABS 0.1 01/05/2017 1359   BASOSABS 0.0 01/05/2017 1359    No results found for: POCLITH, LITHIUM   No results found for: PHENYTOIN, PHENOBARB, VALPROATE, CBMZ   .res Assessment: Plan:   Patient seen for 30 minutes and greater than 50% of visit spent counseling patient and coordination of care.  Will increase alprazolam quantity from #60 to #75 to improve acute anxiety with acute stressors and ensure adequate sleep.  Patient advised to take lowest effective dose to minimize risk of tolerance and dependence.  Agree with plan to establish treatment with therapist. Will continue all other medications as prescribed at this time. Patient to follow-up in 2 months or sooner if clinically indicated. Patient advised to contact office with any questions, adverse effects, or acute  worsening in signs and symptoms.   Faith Thomas was seen today for anxiety and depression.  Diagnoses and all orders for this visit:  Generalized anxiety disorder -     ALPRAZolam (XANAX) 0.5 MG tablet; TAKE 1 TAB PO QHS AND 1/2-1 TABLET BY MOUTH TWICE DAILY AS NEEDED FOR ANXIETY -     busPIRone (BUSPAR) 30 MG tablet; Take 1 tablet (30 mg total) by mouth 2 (two) times daily. -     sertraline (ZOLOFT) 100 MG tablet; Take 2 tablets (200 mg total) by mouth at bedtime.  MDD (recurrent major depressive disorder) -     sertraline (ZOLOFT) 100 MG tablet; Take 2 tablets (200 mg total) by mouth at bedtime. -     mirtazapine (REMERON) 15 MG tablet; TAKE 1 TABLET(15 MG) BY MOUTH AT BEDTIME  Primary insomnia -     mirtazapine (REMERON) 15 MG tablet; TAKE 1 TABLET(15 MG) BY MOUTH AT BEDTIME  Please see After Visit Summary for patient specific instructions.  Future Appointments  Date Time Provider Department Center  07/14/2019 11:00 AM Corie Chiquito, PMHNP [facility] None  08/18/2019  9:20 AM Raulkar, Drema Pry, MD [facility]    No orders of the defined types were placed in this encounter.   -------------------------------

## 2019-05-15 ENCOUNTER — Other Ambulatory Visit: Payer: Self-pay | Admitting: Psychiatry

## 2019-05-15 DIAGNOSIS — E7212 Methylenetetrahydrofolate reductase deficiency: Secondary | ICD-10-CM

## 2019-05-15 DIAGNOSIS — F3340 Major depressive disorder, recurrent, in remission, unspecified: Secondary | ICD-10-CM

## 2019-05-15 DIAGNOSIS — Z1589 Genetic susceptibility to other disease: Secondary | ICD-10-CM

## 2019-05-29 ENCOUNTER — Other Ambulatory Visit: Payer: Self-pay | Admitting: Psychiatry

## 2019-05-29 DIAGNOSIS — F411 Generalized anxiety disorder: Secondary | ICD-10-CM

## 2019-06-06 ENCOUNTER — Other Ambulatory Visit: Payer: Self-pay | Admitting: Psychiatry

## 2019-06-06 DIAGNOSIS — F3340 Major depressive disorder, recurrent, in remission, unspecified: Secondary | ICD-10-CM

## 2019-06-06 DIAGNOSIS — F411 Generalized anxiety disorder: Secondary | ICD-10-CM

## 2019-06-09 ENCOUNTER — Other Ambulatory Visit: Payer: Self-pay | Admitting: Physical Medicine & Rehabilitation

## 2019-07-14 ENCOUNTER — Ambulatory Visit (INDEPENDENT_AMBULATORY_CARE_PROVIDER_SITE_OTHER): Payer: PRIVATE HEALTH INSURANCE | Admitting: Psychiatry

## 2019-07-14 ENCOUNTER — Encounter: Payer: Self-pay | Admitting: Psychiatry

## 2019-07-14 VITALS — Wt 158.0 lb

## 2019-07-14 DIAGNOSIS — F5101 Primary insomnia: Secondary | ICD-10-CM | POA: Diagnosis not present

## 2019-07-14 DIAGNOSIS — F411 Generalized anxiety disorder: Secondary | ICD-10-CM | POA: Diagnosis not present

## 2019-07-14 DIAGNOSIS — F3340 Major depressive disorder, recurrent, in remission, unspecified: Secondary | ICD-10-CM | POA: Diagnosis not present

## 2019-07-14 MED ORDER — DAYVIGO 5 MG PO TABS: 5.0000 mg | ORAL_TABLET | Freq: Every day | ORAL | 0 refills | Status: DC

## 2019-07-14 MED ORDER — MIRTAZAPINE 15 MG PO TABS: ORAL_TABLET | ORAL | 0 refills | Status: DC

## 2019-07-14 MED ORDER — ALPRAZOLAM 0.5 MG PO TABS: ORAL_TABLET | ORAL | 2 refills | Status: DC

## 2019-07-14 MED ORDER — SERTRALINE HCL 100 MG PO TABS: ORAL_TABLET | ORAL | 0 refills | Status: DC

## 2019-07-14 NOTE — Progress Notes (Signed)
Faith Thomas [id number] [birth year] 59 y.o.  Virtual Visit via Telephone Note  I connected with pt on 07/14/19 at 11:00 AM EST by telephone and verified that I am speaking with the correct person using two identifiers.   I discussed the limitations, risks, security and privacy concerns of performing an evaluation and management service by telephone and the availability of in person appointments. I also discussed with the patient that there may be a patient responsible charge related to this service. The patient expressed understanding and agreed to proceed.   I discussed the assessment and treatment plan with the patient. The patient was provided an opportunity to ask questions and all were answered. The patient agreed with the plan and demonstrated an understanding of the instructions.   The patient was advised to call back or seek an in-person evaluation if the symptoms worsen or if the condition fails to improve as anticipated.  I provided 30 minutes of non-face-to-face time during this encounter.  The patient was located at home.  The provider was located at [facility].   Corie Chiquito, PMHNP   Subjective:   Patient ID:  Faith Thomas is a 59 y.o. (DOB [birth year]) female.  Chief Complaint:  Chief Complaint  Patient presents with  . Insomnia  . Follow-up    Anxiety, Depression     HPI Faith Thomas presents for follow-up of h/o anxiety, depression, and insomnia. She reports that her mood has been "ok" and has noticed some improvement in motivation at times, although motivation is lower at other times. Describes slow trajectory of improvement in her overall mental health. She reports that she is noticing some slight improvement in depression. She reports that her mental health seems to be improved with time alone and this is helpful for her anxiety. "Still some ups and downs" and that downs r/t pain are more brief in duration. She reports that  falling asleep continues to be difficult and relying on Xanax 1 mg po QHS and Tizanidine, and that time to fall asleep can still vary from 45 minutes to 3 hours.  She reports that she typically stays asleep once she falls asleep. She reports that her thoughts tend to run at night and will use meditation and other strategies to help. "If I am going to ruminate, it does tend to be that time of day." She reports that her GI s/s have continued to improve. Denies panic s/s. She reports that her appetite has been higher in the evening and is working with the dietician on this and has plan to eat more protein earlier in the day. Energy and motivation have been "up and down." She reports that her energy and motivation are lower on the days that she has more pain. She reports that concentration has been ok unless she is having significant discomfort. Denies SI.   She is having tele-therapy with Aquilla Solian and is finding this helpful. Working on English as a second language teacher.   She reports that her father is going to an independent living facility tomorrow and that this has helped lower her stress. Father's house is in the process of being sold. She reports some stress r/t managing father's funds and determining risks about money management. Father has agreed to allow her to help with his finances.  Practicing yoga daily and reports that this has been helpful for noticing mild s/s of anxiety. Reports that about q 2 weeks she is able to do another task that she wasn't able to do previously.  Past Medication Trials: Buspar-Effective Trintellix- memory difficulties Sertraline- Effective Cymbalta- memory difficulties Prozac-stomach pain  Paxil Celexa Lexapro Remeron- Effective Wellbutrin XL- Irritability Lamictal- ineffective Klonopin Xanax Doxepin Trazodone- stopped working [place]in Lyrica- hypomania    Review of Systems:  Review of Systems  Gastrointestinal:       GI s/s have been gradually  improving.   Musculoskeletal: Positive for arthralgias and myalgias. Negative for gait problem.  Neurological: Negative for tremors.  Psychiatric/Behavioral:       Please refer to HPI   Reports that she was able to receive her first COVID vaccination.   Medications: I have reviewed the patient's current medications.  Current Outpatient Medications  Medication Sig Dispense Refill  . acetaminophen (TYLENOL) 500 MG tablet Take 1,000 mg by mouth every 8 (eight) hours as needed.     Marland Kitchen apixaban (ELIQUIS) 5 MG TABS tablet Take 1 tablet (5 mg total) by mouth 2 (two) times daily. 180 tablet 1  . b complex vitamins capsule Take 1 capsule by mouth daily.    . busPIRone (BUSPAR) 30 MG tablet Take 1 tablet (30 mg total) by mouth 2 (two) times daily. 180 tablet 1  . busPIRone (BUSPAR) 30 MG tablet Take 1 tablet (30 mg total) by mouth 2 (two) times daily. 180 tablet 1  . busPIRone (BUSPAR) 30 MG tablet TAKE 1 TABLET(30 MG) BY MOUTH TWICE DAILY 180 tablet 0  . Cholecalciferol (VITAMIN D-3) 1000 units CAPS Take 10,000 Units by mouth daily.     Marland Kitchen ELIQUIS 5 MG TABS tablet TAKE 1 TABLET (5 MG TOTAL) BY MOUTH 2 (TWO) TIMES DAILY WITH A MEAL. 60 tablet 0  . estradiol (ESTRACE) 0.1 MG/GM vaginal cream Place 1 Applicatorful vaginally at bedtime.    Marland Kitchen L-Methylfolate-Algae (DEPLIN 15) 15-90.314 MG CAPS TAKE 1 CAPSULE BY MOUTH EVERY DAY 90 capsule 2  . lisinopril (PRINIVIL,ZESTRIL) 10 MG tablet TAKE 1 TABLET BY MOUTH EVERY DAY 34 tablet 2  . MAGNESIUM PO Take by mouth.    . methocarbamol (ROBAXIN) 500 MG tablet TAKE 1 TABLET(500 MG) BY MOUTH THREE TIMES DAILY AS NEEDED FOR MUSCLE SPASMS 45 tablet 2  . Methylcobalamin (B-12) 5000 MCG TBDP Inject 0.1 mLs into the muscle once a week.     . mometasone (NASONEX) 50 MCG/ACT nasal spray USE ONE SPRAY IN EACH NOSTRIL ONE TIME DAILY (Patient taking differently: daily as needed. ) 17 g 5  . Omega-3 Fatty Acids (OMEGA 3 PO) Take by mouth.    Marland Kitchen PEPPERMINT OIL PO Take by mouth.     . Probiotic Product (PROBIOTIC PO) Take 1 capsule by mouth daily.     . sucralfate (CARAFATE) 1 g tablet TK 1 T PO BID    . traMADol (ULTRAM) 50 MG tablet TAKE 1 TABLET(50 MG) BY MOUTH THREE TIMES DAILY 90 tablet 2  . vitamin C (ASCORBIC ACID) 500 MG tablet Take 1,000 mg by mouth daily.     Marland Kitchen ALPRAZolam (XANAX) 0.5 MG tablet TAKE 1 TAB PO QHS AND 1/2-1 TABLET BY MOUTH TWICE DAILY AS NEEDED FOR ANXIETY 75 tablet 2  . Lemborexant (DAYVIGO) 5 MG TABS Take 5 mg by mouth at bedtime. 20 tablet 0  . mirtazapine (REMERON) 15 MG tablet TAKE 1 TABLET(15 MG) BY MOUTH AT BEDTIME 90 tablet 0  . sertraline (ZOLOFT) 100 MG tablet TAKE 2 TABLETS(200 MG) BY MOUTH AT BEDTIME 180 tablet 0   No current facility-administered medications for this visit.    Medication Side Effects: None  Allergies:  Allergies  Allergen Reactions  . Citalopram Tinitus  . Escitalopram Tinitus  . Bee Venom Swelling  . Latex Swelling  . Penicillins Swelling and Rash    Has patient had a PCN reaction causing immediate rash, facial/tongue/throat swelling, SOB or lightheadedness with hypotension: Yes Has patient had a PCN reaction causing severe rash involving mucus membranes or skin necrosis: No Has patient had a PCN reaction that required hospitalization: No Has patient had a PCN reaction occurring within the last 10 years: No If all of the above answers are "NO", then may proceed with Cephalosporin use.  Marland Kitchen Cymbalta [Duloxetine Hcl]     -memory loss  . Escitalopram Oxalate Tinitus  . Other     Yeast creams- causes secondary infections, itching, burning, redness   . Progesterone     Pt feels caused weakness, fatigue, cognitive deficits  . Trintellix [Vortioxetine]     Memory problems  . Bupropion Other (See Comments)    weakness and malaise.  . Duloxetine Other (See Comments)    Past Medical History:  Diagnosis Date  . Allergy   . Depression   . DVT (deep venous thrombosis) (HCC)   . Fibromyalgia   . Genetic  defect   . GERD (gastroesophageal reflux disease)   . History of IBS   . 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 Hypertension   . Osteoporosis   . PE (pulmonary thromboembolism) (HCC)   . Positive TB test   . Raynaud disease   . Scoliosis   . Urine incontinence     Family History  Problem Relation Age of Onset  . Arthritis Mother   . Hypertension Mother   . Mental illness Mother   . Anxiety disorder Mother   . Depression Mother   . Arthritis Father   . Hypertension Father   . Mental illness Father   . Anxiety disorder Father   . Depression Father   . Mental illness Brother   . ADD / ADHD Brother   . Psychosis Brother   . Post-traumatic stress disorder Brother   . Arthritis Daughter   . Autism Daughter   . Cancer Maternal Aunt   . Autism Maternal Aunt   . Hypertension Maternal Grandmother   . Arthritis Maternal Grandfather   . Depression Cousin   . Anxiety disorder Cousin   . Depression Maternal Aunt     Social History   Socioeconomic History  . Marital status: Divorced    Spouse name: Not on file  . Number of children: Not on file  . Years of education: Not on file  . Highest education level: Not on file  Occupational History  . Not on file  Tobacco Use  . Smoking status: Never Smoker  . Smokeless tobacco: Never Used  Substance and Sexual Activity  . Alcohol use: Yes    Alcohol/week: 0.0 standard drinks    Comment: occasional   . Drug use: No  . Sexual activity: Not on file  Other Topics Concern  . 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 Difficulty of Paying Living Expenses: Not on file  Food Insecurity:   . Worried About Programme researcher, broadcasting/film/video in the Last Year: Not on file  . Ran Out of Food in the Last Year: Not on file  Transportation Needs:   . Lack  of Transportation (Medical): Not  on file  . Lack of Transportation (Non-Medical): Not on file  Physical Activity:   . Days of Exercise per Week: Not on file  . Minutes of Exercise per Session: Not on file  Stress:   . Feeling of Stress : Not on file  Social Connections:   . Frequency of Communication with Friends and Family: Not on file  . Frequency of Social Gatherings with Friends and Family: Not on file  . Attends Religious Services: Not on file  . Active Member of Clubs or Organizations: Not on file  . Attends Archivist Meetings: Not on file  . Marital Status: Not on file  Intimate Partner Violence:   . Fear of Current or Ex-Partner: Not on file  . Emotionally Abused: Not on file  . Physically Abused: Not on file  . Sexually Abused: Not on file    Past Medical History, Surgical history, Social history, and Family history were reviewed and updated as appropriate.   Please see review of systems for further details on the patient's review from today.   Objective:   Physical Exam:  Wt 158 lb (71.7 kg)   BMI 26.29 kg/m   Physical Exam Neurological:     Mental Status: She is alert and oriented to person, place, and time.     Cranial Nerves: No dysarthria.  Psychiatric:        Attention and Perception: Attention and perception normal.        Mood and Affect: Mood normal.        Speech: Speech normal.        Behavior: Behavior is cooperative.        Thought Content: Thought content normal. Thought content is not paranoid or delusional. Thought content does not include homicidal or suicidal ideation. Thought content does not include homicidal or suicidal plan.        Cognition and Memory: Cognition and memory normal.        Judgment: Judgment normal.     Comments: Insight intact     Lab Review:     Component Value Date/Time   NA 137 09/06/2017 0750   NA 134 (L) 01/05/2017 1359   K 4.5 09/06/2017 0750   K 3.9 01/05/2017 1359   CL 100 09/06/2017 0750   CO2 29 09/06/2017 0750   CO2 25  01/05/2017 1359   GLUCOSE 81 09/06/2017 0750   GLUCOSE 106 01/05/2017 1359   BUN 17 09/06/2017 0750   BUN 10.2 01/05/2017 1359   CREATININE 0.85 09/06/2017 0750   CREATININE 0.8 01/05/2017 1359   CALCIUM 9.7 09/06/2017 0750   CALCIUM 9.5 01/05/2017 1359   PROT 6.4 01/05/2017 1359   ALBUMIN 4.2 01/05/2017 1359   AST 18 01/05/2017 1359   ALT 15 01/05/2017 1359   ALKPHOS 60 01/05/2017 1359   BILITOT 0.22 01/05/2017 1359   GFRNONAA >60 12/04/2016 1548   GFRAA >60 12/04/2016 1548       Component Value Date/Time   WBC 5.2 09/06/2017 0750   RBC 4.29 09/06/2017 0750   HGB 13.9 09/06/2017 0750   HGB 13.4 01/05/2017 1359   HCT 41.1 09/06/2017 0750   HCT 39.0 01/05/2017 1359   PLT 287.0 09/06/2017 0750   PLT 274 01/05/2017 1359   MCV 95.9 09/06/2017 0750   MCV 97.5 01/05/2017 1359   MCH 33.5 01/05/2017 1359   MCH 33.4 12/04/2016 1548   MCHC 33.8 09/06/2017 0750   RDW 13.4 09/06/2017 0750  RDW 12.4 01/05/2017 1359   LYMPHSABS 3.0 01/05/2017 1359   MONOABS 0.6 01/05/2017 1359   EOSABS 0.1 01/05/2017 1359   BASOSABS 0.0 01/05/2017 1359    No results found for: POCLITH, LITHIUM   No results found for: PHENYTOIN, PHENOBARB, VALPROATE, CBMZ   .res Assessment: Plan:   Discussed considering alternative to Xanax since long term use of Xanax for insomnia can lead to tolerance and dependence. Discussed potential benefits, risks, and side effects of Dayvigo. Pt agrees to trial of Dayvigo. Will start Dayvigo 5 mg po QHS for insomnia. Discussed that she can increase Dayvigo to 10 mg po QHS if 5 mg is not effective and she does not have tolerability issues. Discussed gradually decreasing Xanax while starting Dayvigo to avoid benzodiazepine withdrawal s/s.  Continue Sertraline 200 mg po qd for depression and anxiety.  Continue Remeron 15 mg po QHS for depression. Recommend continuing psychotherapy.  Pt to f/u in 2 months or sooner if clinically indicated.  Patient advised to contact office  with any questions, adverse effects, or acute worsening in signs and symptoms.  Tangy was seen today for insomnia and follow-up.  Diagnoses and all orders for this visit:  Primary insomnia -     Lemborexant (DAYVIGO) 5 MG TABS; Take 5 mg by mouth at bedtime. -     mirtazapine (REMERON) 15 MG tablet; TAKE 1 TABLET(15 MG) BY MOUTH AT BEDTIME  MDD (recurrent major depressive disorder) -     mirtazapine (REMERON) 15 MG tablet; TAKE 1 TABLET(15 MG) BY MOUTH AT BEDTIME -     sertraline (ZOLOFT) 100 MG tablet; TAKE 2 TABLETS(200 MG) BY MOUTH AT BEDTIME  Generalized anxiety disorder -     ALPRAZolam (XANAX) 0.5 MG tablet; TAKE 1 TAB PO QHS AND 1/2-1 TABLET BY MOUTH TWICE DAILY AS NEEDED FOR ANXIETY -     sertraline (ZOLOFT) 100 MG tablet; TAKE 2 TABLETS(200 MG) BY MOUTH AT BEDTIME    Please see After Visit Summary for patient specific instructions.  Future Appointments  Date Time Provider Department Center  08/18/2019  9:20 AM Raulkar, Drema Pry, MD [facility]  09/15/2019 11:30 AM Corie Chiquito, PMHNP [facility] None    No orders of the defined types were placed in this encounter.     -------------------------------

## 2019-07-28 ENCOUNTER — Other Ambulatory Visit: Payer: Self-pay

## 2019-07-28 ENCOUNTER — Encounter: Payer: Self-pay | Admitting: Physical Medicine and Rehabilitation

## 2019-07-28 ENCOUNTER — Encounter
Payer: PRIVATE HEALTH INSURANCE | Attending: Physical Medicine and Rehabilitation | Admitting: Physical Medicine and Rehabilitation

## 2019-07-28 VITALS — BP 109/78 | HR 81 | Temp 97.7°F | Ht 65.0 in | Wt 158.0 lb

## 2019-07-28 DIAGNOSIS — M797 Fibromyalgia: Secondary | ICD-10-CM | POA: Insufficient documentation

## 2019-07-28 DIAGNOSIS — M47816 Spondylosis without myelopathy or radiculopathy, lumbar region: Secondary | ICD-10-CM | POA: Diagnosis present

## 2019-07-28 DIAGNOSIS — M67952 Unspecified disorder of synovium and tendon, left thigh: Secondary | ICD-10-CM | POA: Diagnosis present

## 2019-07-28 DIAGNOSIS — M4807 Spinal stenosis, lumbosacral region: Secondary | ICD-10-CM | POA: Insufficient documentation

## 2019-07-28 DIAGNOSIS — G8929 Other chronic pain: Secondary | ICD-10-CM | POA: Diagnosis present

## 2019-07-28 DIAGNOSIS — M5442 Lumbago with sciatica, left side: Secondary | ICD-10-CM

## 2019-07-28 DIAGNOSIS — M816 Localized osteoporosis [Lequesne]: Secondary | ICD-10-CM

## 2019-07-28 MED ORDER — METHOCARBAMOL 750 MG PO TABS: 750.0000 mg | ORAL_TABLET | Freq: Three times a day (TID) | ORAL | 1 refills | Status: DC

## 2019-07-28 MED ORDER — GABAPENTIN 100 MG PO CAPS: 100.0000 mg | ORAL_CAPSULE | Freq: Three times a day (TID) | ORAL | 1 refills | Status: DC

## 2019-07-28 NOTE — Progress Notes (Addendum)
Subjective:    Patient ID: Faith Thomas, female    DOB: August 18, 1960, 59 y.o.   MRN: [id number]  HPI  Faith Thomas returns for follow-up of her back pain that radiates into her left leg.  She continues to feel her lower back and leg pain especially with activities such as vaccuming and mopping. This has worsened since her last visit and she has started taking Ibuprofen though she knows she is not supposed to with her Eliquis. She does not want opioids.  It also limits her walking ability-she now walks about 12 minutes outside daily and she used to love to walk for an hour. She has had relief from the Robaxin which she takes regularly 3 times per day. She had hypomania when she tried Lyrica, a reaction to Cymbalta, and has never tried Amitriptyline. She is now interested in injections.   Overall, she feels that he life is improved from the same time last year. She is very appreciative of her excellent medical care team. She has been doing daily yoga and meditation for stress relief, sometimes 2 hours. She has started a new job with the hours of 2 to midnight and she feels her sleep is better as a result since she can sleep in as long as she needs to in the morning. She does still have some difficulty falling asleep at night.    Pain Inventory Average Pain 8 Pain Right Now 5 My pain is sharp, burning, dull, stabbing, tingling and aching  In the last 24 hours, has pain interfered with the following? General activity 9 Relation with others 9 Enjoyment of life 7 What TIME of day is your pain at its worst? daytime Sleep (in general) Fair  Pain is worse with: walking, bending, sitting, inactivity and standing Pain improves with: rest, heat/ice, therapy/exercise and medication Relief from Meds: 0  Mobility ability to climb steps?  yes do you drive?  yes  Function employed # of hrs/week 40 I need assistance with the following:  meal prep, household duties and  shopping  Neuro/Psych weakness tingling trouble walking dizziness  Prior Studies Any changes since last visit?  no  Physicians involved in your care Any changes since last visit?  no   Family History  Problem Relation Age of Onset  . Arthritis Mother   . Hypertension Mother   . Mental illness Mother   . Anxiety disorder Mother   . Depression Mother   . Arthritis Father   . Hypertension Father   . Mental illness Father   . Anxiety disorder Father   . Depression Father   . Mental illness Brother   . ADD / ADHD Brother   . Psychosis Brother   . Post-traumatic stress disorder Brother   . Arthritis Daughter   . Autism Daughter   . Cancer Maternal Aunt   . Autism Maternal Aunt   . Hypertension Maternal Grandmother   . Arthritis Maternal Grandfather   . Depression Cousin   . Anxiety disorder Cousin   . Depression Maternal Aunt    Social History   Socioeconomic History  . Marital status: Divorced    Spouse name: Not on file  . Number of children: Not on file  . Years of education: Not on file  . Highest education level: Not on file  Occupational History  . Not on file  Tobacco Use  . Smoking status: Never Smoker  . Smokeless tobacco: Never Used  Substance and Sexual Activity  . Alcohol use:  Yes    Alcohol/week: 0.0 standard drinks    Comment: occasional   . Drug use: No  . Sexual activity: Not on file  Other Topics Concern  . 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 Difficulty of Paying Living Expenses: Not on file  Food Insecurity:   . Worried About Charity fundraiser in the Last Year: Not on file  . Ran Out of Food in the Last Year: Not on file  Transportation Needs:   . Lack of Transportation (Medical): Not on file  . Lack of Transportation (Non-Medical): Not on file  Physical Activity:   . Days of  Exercise per Week: Not on file  . Minutes of Exercise per Session: Not on file  Stress:   . Feeling of Stress : Not on file  Social Connections:   . Frequency of Communication with Friends and Family: Not on file  . Frequency of Social Gatherings with Friends and Family: Not on file  . Attends Religious Services: Not on file  . Active Member of Clubs or Organizations: Not on file  . Attends Archivist Meetings: Not on file  . Marital Status: Not on file   No past surgical history on file. Past Medical History:  Diagnosis Date  . Allergy   . Depression   . DVT (deep venous thrombosis) ([place])   . Fibromyalgia   . Genetic defect   . GERD (gastroesophageal reflux disease)   . History of IBS   . Homozygous for MTHFR gene mutation ([place]) 11/28/2016  . Hypertension   . Osteoporosis   . PE (pulmonary thromboembolism) ([place])   . Positive TB test   . Raynaud disease   . Scoliosis   . Urine incontinence    BP 109/78   Pulse 81   Temp 97.7 F (36.5 C)   Ht 5\' 5"  (1.651 m)   Wt 158 lb (71.7 kg)   SpO2 98%   BMI 26.29 kg/m   Opioid Risk Score:   Fall Risk Score:  `1  Depression screen PHQ 2/9  Depression screen [facility] 2/9 04/15/2019 01/07/2018 09/06/2017  Decreased Interest 0 3 0  Down, Depressed, Hopeless 0 3 2  PHQ - 2 Score 0 6 2     Review of Systems  Constitutional: Negative.   HENT: Negative.   Eyes: Negative.   Respiratory: Negative.   Cardiovascular: Negative.   Gastrointestinal: Positive for abdominal pain, diarrhea, nausea and vomiting.  Endocrine: Negative.   Genitourinary: Negative.   Musculoskeletal: Positive for arthralgias, back pain and gait problem.  Skin: Negative.   Allergic/Immunologic: Negative.   Neurological: Positive for weakness and numbness.  Hematological: Negative.   Psychiatric/Behavioral: Negative.   All other systems reviewed and are negative.      Objective:   Physical Exam  Gen: no distress, normal appearing HEENT: oral  mucosa pink and moist, NCAT Cardio: Reg rate Chest: normal effort, normal rate of breathing Abd: soft, non-distended Ext: no edema Skin: intact Neuro/Musculoskeletal: Normal gait. FROM upon flexion, pain reproduced in bilateral low back upon extension. 5/5 motor strength throughout. Negative SLR and Slump tests. Sensation intact throughout. +muscle spasms with trigger points in lumbar paraspinals.  Psych: pleasant, normal affect     Assessment & Plan:  1) Bilateral lower back pain with left sided sciatica 2) Bilateral facet joint  arthritis  -Mrs. Lyall's chief complaint today is lower back pain with left sided sciatica in the L5 distribution. On physical exam, she has painless FROM on flexion with her bilateral low back pain reproduced on extension. This is most consistent with L5 nerve root impingement secondary to stenosis and facet arthropathy, which is consistent with her 2017 lumbar spine MRI, reviewed by me today. She would benefit and is agreeable to a course of physical therapy focused on core strengthening. She would like to hold off on starting this until COVID-19 cases have reduced. For now, she will continue her daily yoga practice, which has been shown to help low back pain.   -Since her pain has worsened since I first saw her, she is now interested in starting medication and considering an epidural steroid injection. We will start low dose Gabapentin 100mg  TID given her past hypomanic response to Lyrica. Asked her to call me to discuss response to this, which she will start during the weekend when she is not working. I will also increase her Robaxin to 750mg  TID for her to use in the meantime.  -Will order MRI without contrast as last MRI was in 2017 and I suspect evidence of L5 nerve root compression that has occurred since this time. She is interested in epidural steroid injection/   3) Fibromyalgia -Patient's symptoms have greatly improved with her change in job, allowing  her to sleep better. Continue daily yoga and meditation for stress relief. Discussed the goal of increasing her aerobic exercise to 15 minutes of daily outdoor walking for now.   3) Osteoporosis: Counseled regarding supplements, diet, and exercise to improve bone density. She is also following with rheumatology and is to start Fosfomax, as well as with a dietician. She has been following diet plan well.   --Follow up in 4 months for continued management of her lower back pain and fibromyalgia.  All questions were encouraged and answered. RTC in 1 month.

## 2019-08-04 ENCOUNTER — Telehealth: Payer: Self-pay | Admitting: Psychiatry

## 2019-08-04 ENCOUNTER — Other Ambulatory Visit: Payer: Self-pay

## 2019-08-04 DIAGNOSIS — F5101 Primary insomnia: Secondary | ICD-10-CM

## 2019-08-04 MED ORDER — DAYVIGO 10 MG PO TABS: 10.0000 mg | ORAL_TABLET | Freq: Every day | ORAL | 1 refills | Status: DC

## 2019-08-04 NOTE — Telephone Encounter (Signed)
RX Dayvigo 10 mg 1 q hs, pended for Shanda Bumps to submit  Next apt 09/2019

## 2019-08-04 NOTE — Telephone Encounter (Signed)
Patient called and said that she was trying the dayvigo 10 mg samples. It is working and she would like a script to be sent to [facility] on [address]

## 2019-08-08 ENCOUNTER — Other Ambulatory Visit: Payer: Self-pay | Admitting: Physical Medicine and Rehabilitation

## 2019-08-08 ENCOUNTER — Other Ambulatory Visit: Payer: Self-pay | Admitting: Physical Medicine & Rehabilitation

## 2019-08-08 MED ORDER — TRAMADOL HCL 50 MG PO TABS: 50.0000 mg | ORAL_TABLET | Freq: Four times a day (QID) | ORAL | 0 refills | Status: DC | PRN

## 2019-08-11 ENCOUNTER — Telehealth: Payer: Self-pay | Admitting: *Deleted

## 2019-08-11 MED ORDER — TRAMADOL HCL 50 MG PO TABS: ORAL_TABLET | ORAL | 0 refills | Status: DC

## 2019-08-11 NOTE — Telephone Encounter (Signed)
PMP was Reviewed: Dr. Carlis Abbott note was reviewed.  Tramadol e-scribed today, this provider was in contact with Dr. Carlis Abbott regarding the above.  Placed a call to Ms. Colvin she is aware of the above and verbalizes understanding.

## 2019-08-11 NOTE — Telephone Encounter (Signed)
Patient walked into clinic upset about a script that was sent into the pharmacy by Dr. Carlis Abbott on Friday, March 5th, 2021.  The script was for #20 tramadol. Her original script was for #90.  She stated that the pharmacy told her that Dr Martina Sinner DEA number could not be found and she had to pay cash.  She told me that she was refusing to leave until this is taken care of.

## 2019-08-16 ENCOUNTER — Other Ambulatory Visit: Payer: Self-pay

## 2019-08-16 ENCOUNTER — Ambulatory Visit (HOSPITAL_COMMUNITY)
Admission: RE | Admit: 2019-08-16 | Discharge: 2019-08-16 | Disposition: A | Discharge status: Home / Self Care | Payer: PRIVATE HEALTH INSURANCE | Source: Ambulatory Visit | Attending: Physical Medicine and Rehabilitation | Admitting: Physical Medicine and Rehabilitation

## 2019-08-16 DIAGNOSIS — M4807 Spinal stenosis, lumbosacral region: Secondary | ICD-10-CM | POA: Insufficient documentation

## 2019-08-18 ENCOUNTER — Ambulatory Visit: Payer: PRIVATE HEALTH INSURANCE | Admitting: Physical Medicine and Rehabilitation

## 2019-08-25 ENCOUNTER — Other Ambulatory Visit: Payer: Self-pay

## 2019-08-25 ENCOUNTER — Encounter: Payer: Self-pay | Admitting: Physical Medicine and Rehabilitation

## 2019-08-25 ENCOUNTER — Encounter
Payer: PRIVATE HEALTH INSURANCE | Attending: Physical Medicine and Rehabilitation | Admitting: Physical Medicine and Rehabilitation

## 2019-08-25 VITALS — BP 94/67 | HR 81 | Temp 97.9°F | Ht 65.0 in | Wt 162.0 lb

## 2019-08-25 DIAGNOSIS — M797 Fibromyalgia: Secondary | ICD-10-CM | POA: Diagnosis present

## 2019-08-25 DIAGNOSIS — G4701 Insomnia due to medical condition: Secondary | ICD-10-CM

## 2019-08-25 DIAGNOSIS — M4807 Spinal stenosis, lumbosacral region: Secondary | ICD-10-CM | POA: Insufficient documentation

## 2019-08-25 DIAGNOSIS — G8929 Other chronic pain: Secondary | ICD-10-CM

## 2019-08-25 DIAGNOSIS — M67952 Unspecified disorder of synovium and tendon, left thigh: Secondary | ICD-10-CM | POA: Insufficient documentation

## 2019-08-25 DIAGNOSIS — M5442 Lumbago with sciatica, left side: Secondary | ICD-10-CM | POA: Insufficient documentation

## 2019-08-25 DIAGNOSIS — M816 Localized osteoporosis [Lequesne]: Secondary | ICD-10-CM | POA: Insufficient documentation

## 2019-08-25 DIAGNOSIS — Z5181 Encounter for therapeutic drug level monitoring: Secondary | ICD-10-CM | POA: Insufficient documentation

## 2019-08-25 DIAGNOSIS — I952 Hypotension due to drugs: Secondary | ICD-10-CM | POA: Diagnosis not present

## 2019-08-25 DIAGNOSIS — M47816 Spondylosis without myelopathy or radiculopathy, lumbar region: Secondary | ICD-10-CM | POA: Diagnosis present

## 2019-08-25 DIAGNOSIS — Z79891 Long term (current) use of opiate analgesic: Secondary | ICD-10-CM | POA: Insufficient documentation

## 2019-08-25 DIAGNOSIS — G894 Chronic pain syndrome: Secondary | ICD-10-CM | POA: Diagnosis not present

## 2019-08-25 MED ORDER — TRAMADOL HCL 50 MG PO TABS: ORAL_TABLET | ORAL | 0 refills | Status: DC

## 2019-08-25 MED ORDER — AMITRIPTYLINE HCL 10 MG PO TABS: 10.0000 mg | ORAL_TABLET | Freq: Every day | ORAL | 0 refills | Status: DC

## 2019-08-25 NOTE — Progress Notes (Signed)
Subjective:    Patient ID: Faith Thomas, female    DOB: 1960/12/05, 59 y.o.   MRN: [id number]  HPI  Mrs. 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  She continues to feel her lower back and leg pain especially with activities such as vaccuming and mopping. This has worsened since her last visit and she has started taking Ibuprofen though she knows she is not supposed to with her Eliquis. She does not want opioids.  It also limits her walking ability-she now walks about 12 minutes outside daily and she used to love to walk for an hour. She has had relief from the Robaxin which she takes regularly 3 times per day. She had hypomania when she tried Lyrica, a reaction to Cymbalta, and has never tried Amitriptyline. She takes the Tramadol three times per day and is able to tolerate this well. She has tried Gabapentin 100mg  TID since last visit and felt GI upset and insomnia with this. Since last visit we obtained an MRI and I have discussed results with patient. She is now interested in injections and would like more information about the options available to her.   Overall, she feels that he life is improved from the same time last year. She is very appreciative of her excellent medical care team. She has been doing daily yoga and meditation for stress relief, sometimes 2 hours. She has started a new job with the hours of 2 to midnight. She does still have some difficulty falling asleep at night.  Eventually she hopes to be able to get off of a lot of the medications that she is taking.   Pain Inventory Average Pain 6 Pain Right Now 5 My pain is sharp, burning, dull, stabbing, tingling and aching  In the last 24 hours, has pain interfered with the following? General activity 7 Relation with others 7 Enjoyment of life 5 What TIME of day is your pain at its worst? veriss Sleep (in general) Fair  Pain is worse with: walking, bending,  sitting, inactivity and standing Pain improves with: rest, heat/ice, therapy/exercise and medication Relief from Meds: 5  Mobility walk without assistance ability to climb steps?  yes do you drive?  yes  Function employed # of hrs/week . I need assistance with the following:  meal prep, household duties and shopping  Neuro/Psych weakness tremor tingling spasms dizziness  Prior Studies Any changes since last visit?  no  Physicians involved in your care Any changes since last visit?  no   Family History  Problem Relation Age of Onset  . Arthritis Mother   . Hypertension Mother   . Mental illness Mother   . Anxiety disorder Mother   . Depression Mother   . Arthritis Father   . Hypertension Father   . Mental illness Father   . Anxiety disorder Father   . Depression Father   . Mental illness Brother   . ADD / ADHD Brother   . Psychosis Brother   . Post-traumatic stress disorder Brother   . Arthritis Daughter   . Autism Daughter   . Cancer Maternal Aunt   . Autism Maternal Aunt   . Hypertension Maternal Grandmother   . Arthritis Maternal Grandfather   . Depression Cousin   . Anxiety disorder Cousin   . Depression Maternal Aunt    Social History   Socioeconomic History  . Marital status: Divorced    Spouse name: Not on file  .  Number of children: Not on file  . Years of education: Not on file  . Highest education level: Not on file  Occupational History  . Not on file  Tobacco Use  . Smoking status: Never Smoker  . Smokeless tobacco: Never Used  Substance and Sexual Activity  . Alcohol use: Yes    Alcohol/week: 0.0 standard drinks    Comment: occasional   . Drug use: No  . Sexual activity: Not on file  Other Topics Concern  . 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 Difficulty of Paying Living  Expenses:   Food Insecurity:   . Worried About Programme researcher, broadcasting/film/video in the Last Year:   . Barista in the Last Year:   Transportation Needs:   . Freight forwarder (Medical):   Marland Kitchen Lack of Transportation (Non-Medical):   Physical Activity:   . Days of Exercise per Week:   . Minutes of Exercise per Session:   Stress:   . Feeling of Stress :   Social Connections:   . Frequency of Communication with Friends and Family:   . Frequency of Social Gatherings with Friends and Family:   . Attends Religious Services:   . Active Member of Clubs or Organizations:   . Attends Banker Meetings:   Marland Kitchen Marital Status:    No past surgical history on file. Past Medical History:  Diagnosis Date  . Allergy   . Depression   . DVT (deep venous thrombosis) (HCC)   . Fibromyalgia   . Genetic defect   . GERD (gastroesophageal reflux disease)   . History of IBS   . 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 Hypertension   . Osteoporosis   . PE (pulmonary thromboembolism) (HCC)   . Positive TB test   . Raynaud disease   . Scoliosis   . Urine incontinence    There were no vitals taken for this visit.  Opioid Risk Score:   Fall Risk Score:  `1  Depression screen PHQ 2/9  Depression screen [facility] 2/9 04/15/2019 01/07/2018 09/06/2017  Decreased Interest 0 3 0  Down, Depressed, Hopeless 0 3 2  PHQ - 2 Score 0 6 2     Review of Systems  Constitutional: Negative.   HENT: Negative.   Eyes: Negative.   Respiratory: Negative.   Cardiovascular: Negative.   Gastrointestinal: Negative.   Endocrine: Negative.   Genitourinary: Negative.   Musculoskeletal: Positive for arthralgias, back pain and myalgias.  Skin: Negative.   Allergic/Immunologic: Negative.   Neurological: Positive for dizziness, tremors and weakness.  Hematological: Negative.   Psychiatric/Behavioral: Negative.   All other systems reviewed and are negative.      Objective:   Physical Exam  Vitals  reviewed and her SBP is 94.  Gen: no distress, normal appearing HEENT: oral mucosa pink and moist, NCAT Cardio: Reg rate Chest: normal effort, normal rate of breathing Abd: soft, non-distended Ext: no edema Skin: intact Neuro/Musculoskeletal:Normal gait. FROM upon flexion, pain reproduced in bilateral low back upon extension. 5/5 motor strength throughout. + Slump test on left. Sensation intact throughout.+muscle spasms with trigger points in lumbar paraspinals.  Psych: pleasant, normal affect    Assessment & Plan:  1) Bilateral lower back pain with left sided sciatica 2) Bilateral facet joint arthritis  -Mrs. Inclan's  chief complaint today is lower back pain with left sided sciatica in the L5 distribution. On physical exam, she has painless FROM on flexion with her bilateral low back pain. On last visit this was reproduced with extension but currently she has full and painless extension. Her pain has been most consistent with L5 nerve root impingement secondary to stenosis and facet arthropathy, which is seen on her 2017 lumbar spine MRI. Since last visit we repeated her MRI and it shows worsening facet arthropathy as well as left sided annular fissue; results reviewed and discussed with patient. She would benefit and is agreeable to a course of physical therapy focused on core strengthening. She would like to hold off on starting this until COVID-19 cases have reduced. For now, she will continue her daily yoga practice, which has been shown to help low back pain.   -Since her pain has worsened since I first saw her, she is now interested in starting medication and considering an epidural steroid injection. She did not tolerate Gabapentin well and has not tried Amitriptylline in the past. Last visit I increased her Robaxin to 750mg  TID for her to use in the meantime. Provided 6 month refill of Tramadol.   -She is scheduled for a left L5 facet joint medial branch block with Dr.  on 3/30. Advised that she stop Eliquis 3 days prior.   3) Fibromyalgia -Patient's symptoms have greatly improved with her change in job, allowing her to sleep better. Continue daily yoga and meditation for stress relief. Discussed the goal of increasing her aerobic exercise to 15 minutes of daily outdoor walking for now.   3) Osteoporosis: Counseled regarding supplements, diet, and exercise to improve bone density. She is also following with rheumatology and is to start Fosfomax, as well as with a dietician. She has been following diet plan well.   4) Hypotension: SBP is 94 in office. Advised that she cut down Lisinopril to 5mg . She will have vitals repeated at follow-up with Dr. 4/30. If BP< 120/80, stop Lisinopril all together.   --Follow up in 4 months for continued management of her lower back pain and fibromyalgia.  All questions were encouraged and answered.RTC in 2 months.

## 2019-08-27 ENCOUNTER — Telehealth: Payer: Self-pay

## 2019-08-27 NOTE — Telephone Encounter (Signed)
Pharmacy called stating they received an alert on the interaction of Amitriptyline and Sertaline. Are you aware of the interaction.

## 2019-08-28 NOTE — Telephone Encounter (Signed)
Yes I am aware, thank you Thurston Hole! Will monitor for symptoms of serotonin syndrome

## 2019-08-28 NOTE — Telephone Encounter (Signed)
[facility] notified.

## 2019-09-02 ENCOUNTER — Other Ambulatory Visit: Payer: Self-pay

## 2019-09-02 ENCOUNTER — Encounter: Payer: Self-pay | Admitting: Physical Medicine & Rehabilitation

## 2019-09-02 ENCOUNTER — Encounter (HOSPITAL_BASED_OUTPATIENT_CLINIC_OR_DEPARTMENT_OTHER): Payer: PRIVATE HEALTH INSURANCE | Admitting: Physical Medicine & Rehabilitation

## 2019-09-02 VITALS — Temp 97.7°F | Ht 65.0 in | Wt 164.0 lb

## 2019-09-02 DIAGNOSIS — M47817 Spondylosis without myelopathy or radiculopathy, lumbosacral region: Secondary | ICD-10-CM

## 2019-09-02 DIAGNOSIS — Z5181 Encounter for therapeutic drug level monitoring: Secondary | ICD-10-CM

## 2019-09-02 DIAGNOSIS — G894 Chronic pain syndrome: Secondary | ICD-10-CM

## 2019-09-02 DIAGNOSIS — Z79891 Long term (current) use of opiate analgesic: Secondary | ICD-10-CM

## 2019-09-02 NOTE — Progress Notes (Signed)
Left Lumbar L3, L4  medial branch blocks and L 5 dorsal ramus injection under fluoroscopic guidance   Indication: Left Lumbar pain which is not relieved by medication management or other conservative care and interfering with self-care and mobility.  Informed consent was obtained after describing risks and benefits of the procedure with the patient, this includes bleeding, bruising, infection, paralysis and medication side effects.  The patient wishes to proceed and has given written consent.  The patient was placed in a prone position.  The lumbar area was marked and prepped with Betadine.  One mL of 1% lidocaine was injected into each of 3 areas into the skin and subcutaneous tissue.  Then a 22-gauge 3.5in spinal needle was inserted targeting the junction of the left S1 superior articular process and sacral ala junction.  Needle was advanced under fluoroscopic guidance.  Bone contact was made. Isovue 200 was injected x 0.5 mL demonstrating no intravascular uptake.  Then a solution containing  2% MPF lidocaine was injected x 0.5 mL.  Then the left L5 superior articular process in transverse process junction was targeted.  Bone contact was made. Isovue 200 was injected x 0.5 mL demonstrating no intravascular uptake.  Then a solution containing 2% MPF lidocaine was injected x 0.5 mL.  Then the left L4 superior articular process in transverse process junction was targeted.  Bone contact was made.  Isovue 200 was injected x 0.5 mL demonstrating no intravascular uptake.  Then a solution containing  2% MPF lidocaine was injected x 0.5 mL.  Patient tolerated procedure well.  Post procedure instructions were given. 

## 2019-09-02 NOTE — Progress Notes (Signed)
  PROCEDURE RECORD [facility]   Name: Faith Thomas DOB:1960-10-04 MRN: [id number]  Date:09/02/2019  Physician: Claudette Laws, MD    Nurse/CMA: Darel Ricketts, CMA  Allergies:  Allergies  Allergen Reactions  . Citalopram Tinitus  . Escitalopram Tinitus  . Bee Venom Swelling  . Latex Swelling  . Penicillins Swelling and Rash    Has patient had a PCN reaction causing immediate rash, facial/tongue/throat swelling, SOB or lightheadedness with hypotension: Yes Has patient had a PCN reaction causing severe rash involving mucus membranes or skin necrosis: No Has patient had a PCN reaction that required hospitalization: No Has patient had a PCN reaction occurring within the last 10 years: No If all of the above answers are "NO", then may proceed with Cephalosporin use.  Marland Kitchen Cymbalta [Duloxetine Hcl]     -memory loss  . Escitalopram Oxalate Tinitus  . Other     Yeast creams- causes secondary infections, itching, burning, redness   . Progesterone     Pt feels caused weakness, fatigue, cognitive deficits  . Trintellix [Vortioxetine]     Memory problems  . Bupropion Other (See Comments)    weakness and malaise.  . Duloxetine Other (See Comments)    Consent Signed: Yes.    Is patient diabetic? No.  CBG today?   Pregnant: No. LMP: No LMP recorded. Patient is postmenopausal. (age 56-55)  Anticoagulants: yes (eliquis stopped 08/30/2019) Anti-inflammatory: no Antibiotics: no  Procedure: Left L5-S1  medial branch block Position: Prone Start Time:  10:45am     End Time: 10:53am   Fluoro Time: 25s  RN/CMA Torry Adamczak, CMA Rhondalyn Clingan, CMA    Time 10:29am 10    BP 136/99 158/100    Pulse 81 68    Respirations 14 14    O2 Sat 97 97    S/S 6 6    Pain Level 4/10 3/10     D/C home with self, patient A & O X 3, D/C instructions reviewed, and sits independently.

## 2019-09-02 NOTE — Patient Instructions (Addendum)

## 2019-09-05 LAB — DRUG TOX MONITOR 1 W/CONF, ORAL FLD
Alprazolam: 1.57 ng/mL — ABNORMAL HIGH (ref <0.50)
Amphetamines: NEGATIVE ng/mL (ref <10)
Barbiturates: NEGATIVE ng/mL (ref <10)
Benzodiazepines: POSITIVE ng/mL — AB (ref <0.50)
Buprenorphine: NEGATIVE ng/mL (ref <0.10)
Chlordiazepoxide: NEGATIVE ng/mL (ref <0.50)
Clonazepam: NEGATIVE ng/mL (ref <0.50)
Cocaine: NEGATIVE ng/mL (ref <5.0)
Diazepam: NEGATIVE ng/mL (ref <0.50)
Fentanyl: NEGATIVE ng/mL (ref <0.10)
Flunitrazepam: NEGATIVE ng/mL (ref <0.50)
Flurazepam: NEGATIVE ng/mL (ref <0.50)
Heroin Metabolite: NEGATIVE ng/mL (ref <1.0)
Lorazepam: NEGATIVE ng/mL (ref <0.50)
MARIJUANA: NEGATIVE ng/mL (ref <2.5)
MDMA: NEGATIVE ng/mL (ref <10)
Meprobamate: NEGATIVE ng/mL (ref <2.5)
Methadone: NEGATIVE ng/mL (ref <5.0)
Midazolam: NEGATIVE ng/mL (ref <0.50)
Nicotine Metabolite: NEGATIVE ng/mL (ref <5.0)
Nordiazepam: NEGATIVE ng/mL (ref <0.50)
Opiates: NEGATIVE ng/mL (ref <2.5)
Oxazepam: NEGATIVE ng/mL (ref <0.50)
Phencyclidine: NEGATIVE ng/mL (ref <10)
Tapentadol: NEGATIVE ng/mL (ref <5.0)
Temazepam: NEGATIVE ng/mL (ref <0.50)
Tramadol: >500 ng/mL — ABNORMAL HIGH (ref <5.0)
Tramadol: POSITIVE ng/mL — AB (ref <5.0)
Triazolam: NEGATIVE ng/mL (ref <0.50)
Zolpidem: NEGATIVE ng/mL (ref <5.0)

## 2019-09-05 LAB — DRUG TOX ALC METAB W/CON, ORAL FLD: Alcohol Metabolite: NEGATIVE ng/mL (ref <25)

## 2019-09-08 ENCOUNTER — Telehealth: Payer: Self-pay | Admitting: *Deleted

## 2019-09-08 NOTE — Telephone Encounter (Addendum)
Oral swab drug screen was consistent for prescribed medications.  ?

## 2019-09-09 ENCOUNTER — Telehealth: Payer: Self-pay

## 2019-09-09 NOTE — Telephone Encounter (Signed)
UDS RESULTS CONSISTENT WITH MEDICATIONS ON FILE  

## 2019-09-15 ENCOUNTER — Encounter: Payer: Self-pay | Admitting: Psychiatry

## 2019-09-15 ENCOUNTER — Ambulatory Visit (INDEPENDENT_AMBULATORY_CARE_PROVIDER_SITE_OTHER): Payer: PRIVATE HEALTH INSURANCE | Admitting: Psychiatry

## 2019-09-15 ENCOUNTER — Other Ambulatory Visit: Payer: Self-pay

## 2019-09-15 VITALS — BP 122/80 | HR 81 | Wt 162.0 lb

## 2019-09-15 DIAGNOSIS — F5101 Primary insomnia: Secondary | ICD-10-CM

## 2019-09-15 DIAGNOSIS — F411 Generalized anxiety disorder: Secondary | ICD-10-CM | POA: Diagnosis not present

## 2019-09-15 DIAGNOSIS — F3340 Major depressive disorder, recurrent, in remission, unspecified: Secondary | ICD-10-CM

## 2019-09-15 MED ORDER — BELSOMRA 20 MG PO TABS: 20.0000 mg | ORAL_TABLET | Freq: Every day | ORAL | 2 refills | Status: DC

## 2019-09-15 MED ORDER — BUSPIRONE HCL 30 MG PO TABS: 30.0000 mg | ORAL_TABLET | Freq: Two times a day (BID) | ORAL | 1 refills | Status: DC

## 2019-09-15 MED ORDER — SERTRALINE HCL 100 MG PO TABS: ORAL_TABLET | ORAL | 1 refills | Status: DC

## 2019-09-15 NOTE — Progress Notes (Signed)
Winry Egnew [id number] February 20, 1961 59 y.o.  Subjective:   Patient ID:  Areta Terwilliger is a 59 y.o. (DOB [birth year]) female.  Chief Complaint:  Chief Complaint  Patient presents with  . Insomnia  . Anxiety  . Depression    HPI Tiyana Galla presents to the office today for follow-up of anxiety, depression, and insomnia.  Spent about 5 months getting father admitted to [facility] and then father decided to move to [place] state where his twin brother is located. Father leaves Wednesday. She reports that she had some increased anxiety and increase in physical s/s with this stress. She reports some sadness in response to father moving away. Has noticed some mild depression. Has been setting limits with family in terms of not responding to texts and calls during the work day unless it is an emergency. She reports that worry has been lower in general. Denies any panic s/s.   Reports some slight improvement in sleep with Dayvigo and has also been tried on different medications for pain that have caused sleep disruption. She reports that her sleep was significantly disrupted with taking Amitriptyline and decided to stop Amitriptyline in the last week as a result. She reports that she had trouble sleeping last night. Energy and motivation have ranged from "immobility" to times where she has adequate motivation. Appetite has been ok. Concentration is ok as long as there are less distractions from family. Denies SI.  Reports improved relationship with her daughter now that daughter has her own child. Granddaughter has been doing well. She reports that working remotely has been helpful for her health.   Past Medication Trials: Buspar-Effective Trintellix- memory difficulties Sertraline- Effective Cymbalta- memory difficulties Prozac-stomach pain  Paxil Celexa Lexapro Remeron- Effective Wellbutrin XL- Irritability Lamictal-  ineffective Klonopin Xanax Doxepin Trazodone- stopped working [employer] Lyrica- hypomania Gabapentin- sleep disturbance Amitriptyline-sleeplessness, ineffective.   PHQ2-9     [facility] from [facility]/15/2019 [facility] from 09/06/2017 in [place] [facility] Total Score  0  6  2       Review of Systems:  Review of Systems  Gastrointestinal:       Reports that GI s/s have improved some.  Musculoskeletal: Negative for gait problem.       She reports that nerve block significantly helped with back pain and sciatica.  Will receive infusion for low bone density.   Neurological: Positive for tremors.       Has had some hand tremor since she was a teenager.   Psychiatric/Behavioral:       Please refer to HPI    Medications: I have reviewed the patient's current medications.  Current Outpatient Medications  Medication Sig Dispense Refill  . acetaminophen (TYLENOL) 500 MG tablet Take 1,000 mg by mouth every 8 (eight) hours as needed.     . ALPRAZolam (XANAX) 0.5 MG tablet TAKE 1 TAB PO QHS AND 1/2-1 TABLET BY MOUTH TWICE DAILY AS NEEDED FOR ANXIETY 75 tablet 2  . apixaban (ELIQUIS) 5 MG TABS tablet Take 1 tablet (5 mg total) by mouth 2 (two) times daily. 180 tablet 1  . b complex vitamins capsule Take 1 capsule by mouth daily.    . busPIRone (BUSPAR) 30 MG tablet Take 1 tablet (30 mg total) by mouth 2 (two) times daily. 180 tablet 1  . Cholecalciferol (VITAMIN D-3) 1000 units CAPS Take 10,000 Units by  mouth daily.     Marland Kitchen ELIQUIS 5 MG TABS tablet TAKE 1 TABLET (5 MG TOTAL) BY MOUTH 2 (TWO) TIMES DAILY WITH A MEAL. 60 tablet 0  . L-Methylfolate-Algae (DEPLIN 15) 15-90.314 MG CAPS TAKE 1 CAPSULE BY MOUTH EVERY DAY 90 capsule 2  . lisinopril (PRINIVIL,ZESTRIL) 10 MG tablet TAKE 1 TABLET BY MOUTH EVERY DAY 34 tablet 2  . methocarbamol (ROBAXIN) 750 MG  tablet Take 1 tablet (750 mg total) by mouth 3 (three) times daily. 90 tablet 1  . mirtazapine (REMERON) 15 MG tablet TAKE 1 TABLET(15 MG) BY MOUTH AT BEDTIME 90 tablet 0  . mometasone (NASONEX) 50 MCG/ACT nasal spray USE ONE SPRAY IN EACH NOSTRIL ONE TIME DAILY (Patient taking differently: daily as needed. ) 17 g 5  . Omega-3 Fatty Acids (OMEGA 3 PO) Take by mouth.    . Probiotic Product (PROBIOTIC PO) Take 1 capsule by mouth daily.     . sertraline (ZOLOFT) 100 MG tablet TAKE 2 TABLETS(200 MG) BY MOUTH AT BEDTIME 180 tablet 1  . sucralfate (CARAFATE) 1 g tablet Take 1 g by mouth 2 (two) times daily as needed.     . traMADol (ULTRAM) 50 MG tablet TAKE 1 TABLET(50 MG) BY MOUTH THREE TIMES DAILY 540 tablet 0  . UNABLE TO FIND Beet Root    . vitamin C (ASCORBIC ACID) 500 MG tablet Take 1,000 mg by mouth daily.     Marland Kitchen amitriptyline (ELAVIL) 10 MG tablet Take 1 tablet (10 mg total) by mouth at bedtime. (Patient not taking: Reported on 09/15/2019) 30 tablet 0  . estradiol (ESTRACE) 0.1 MG/GM vaginal cream Place 1 Applicatorful vaginally at bedtime.    . Suvorexant (BELSOMRA) 20 MG TABS Take 20 mg by mouth at bedtime. 30 tablet 2   No current facility-administered medications for this visit.    Medication Side Effects: None  Allergies:  Allergies  Allergen Reactions  . Citalopram Tinitus  . Escitalopram Tinitus  . Bee Venom Swelling  . Latex Swelling  . Penicillins Swelling and Rash    Has patient had a PCN reaction causing immediate rash, facial/tongue/throat swelling, SOB or lightheadedness with hypotension: Yes Has patient had a PCN reaction causing severe rash involving mucus membranes or skin necrosis: No Has patient had a PCN reaction that required hospitalization: No Has patient had a PCN reaction occurring within the last 10 years: No If all of the above answers are "NO", then may proceed with Cephalosporin use.  Marland Kitchen Cymbalta [Duloxetine Hcl]     -memory loss  . Escitalopram Oxalate  Tinitus  . Other     Yeast creams- causes secondary infections, itching, burning, redness   . Progesterone     Pt feels caused weakness, fatigue, cognitive deficits  . Trintellix [Vortioxetine]     Memory problems  . Bupropion Other (See Comments)    weakness and malaise.  . Duloxetine Other (See Comments)    Past Medical History:  Diagnosis Date  . Allergy   . Depression   . DVT (deep venous thrombosis) (HCC)   . Fibromyalgia   . Genetic defect   . GERD (gastroesophageal reflux disease)   . History of IBS   . 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 Hypertension   . Osteoporosis   . PE (pulmonary thromboembolism) (HCC)   . Positive TB test   . Raynaud disease   . Scoliosis   . Urine incontinence     Family History  Problem Relation Age of  Onset  . Arthritis Mother   . Hypertension Mother   . Mental illness Mother   . Anxiety disorder Mother   . Depression Mother   . Arthritis Father   . Hypertension Father   . Mental illness Father   . Anxiety disorder Father   . Depression Father   . Mental illness Brother   . ADD / ADHD Brother   . Psychosis Brother   . Post-traumatic stress disorder Brother   . Arthritis Daughter   . Autism Daughter   . Cancer Maternal Aunt   . Autism Maternal Aunt   . Hypertension Maternal Grandmother   . Arthritis Maternal Grandfather   . Depression Cousin   . Anxiety disorder Cousin   . Depression Maternal Aunt     Social History   Socioeconomic History  . Marital status: Divorced    Spouse name: Not on file  . Number of children: Not on file  . Years of education: Not on file  . Highest education level: Not on file  Occupational History  . Not on file  Tobacco Use  . Smoking status: Never Smoker  . Smokeless tobacco: Never Used  Substance and Sexual Activity  . Alcohol use: Yes    Alcohol/week: 0.0 standard drinks    Comment: occasional   . Drug use: No  . Sexual activity: Not on file  Other Topics  Concern  . 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 Difficulty of Paying Living Expenses:   Food Insecurity:   . Worried About Programme researcher, broadcasting/film/videounning Out of Food in the Last Year:   . Baristaan Out of Food in the Last Year:   Transportation Needs:   . Freight forwarderLack of Transportation (Medical):   Marland Kitchen. Lack of Transportation (Non-Medical):   Physical Activity:   . Days of Exercise per Week:   . Minutes of Exercise per Session:   Stress:   . Feeling of Stress :   Social Connections:   . Frequency of Communication with Friends and Family:   . Frequency of Social Gatherings with Friends and Family:   . Attends Religious Services:   . Active Member of Clubs or Organizations:   . Attends BankerClub or Organization Meetings:   Marland Kitchen. Marital Status:   Intimate Partner Violence:   . Fear of Current or Ex-Partner:   . Emotionally Abused:   Marland Kitchen. Physically Abused:   . Sexually Abused:     Past Medical History, Surgical history, Social history, and Family history were reviewed and updated as appropriate.   Please see review of systems for further details on the patient's review from today.   Objective:   Physical Exam:  BP 122/80   Pulse 81   Wt 162 lb (73.5 kg)   BMI 26.96 kg/m   Physical Exam Constitutional:      General: She is not in acute distress. Musculoskeletal:        General: No deformity.  Neurological:     Mental Status: She is alert and oriented to person, place, and time.     Coordination: Coordination normal.  Psychiatric:        Attention and Perception: Attention and perception normal. She does not perceive auditory or visual hallucinations.        Mood and Affect: Affect is not labile, blunt, angry or inappropriate.  Speech: Speech normal.        Behavior: Behavior normal.        Thought Content: Thought content normal. Thought content is not  paranoid or delusional. Thought content does not include homicidal or suicidal ideation. Thought content does not include homicidal or suicidal plan.        Cognition and Memory: Cognition and memory normal.        Judgment: Judgment normal.     Comments: Insight intact Mood is appropriate to content. Affect is congruent.      Lab Review:     Component Value Date/Time   NA 137 09/06/2017 0750   NA 134 (L) 01/05/2017 1359   K 4.5 09/06/2017 0750   K 3.9 01/05/2017 1359   CL 100 09/06/2017 0750   CO2 29 09/06/2017 0750   CO2 25 01/05/2017 1359   GLUCOSE 81 09/06/2017 0750   GLUCOSE 106 01/05/2017 1359   BUN 17 09/06/2017 0750   BUN 10.2 01/05/2017 1359   CREATININE 0.85 09/06/2017 0750   CREATININE 0.8 01/05/2017 1359   CALCIUM 9.7 09/06/2017 0750   CALCIUM 9.5 01/05/2017 1359   PROT 6.4 01/05/2017 1359   ALBUMIN 4.2 01/05/2017 1359   AST 18 01/05/2017 1359   ALT 15 01/05/2017 1359   ALKPHOS 60 01/05/2017 1359   BILITOT 0.22 01/05/2017 1359   GFRNONAA >60 12/04/2016 1548   GFRAA >60 12/04/2016 1548       Component Value Date/Time   WBC 5.2 09/06/2017 0750   RBC 4.29 09/06/2017 0750   HGB 13.9 09/06/2017 0750   HGB 13.4 01/05/2017 1359   HCT 41.1 09/06/2017 0750   HCT 39.0 01/05/2017 1359   PLT 287.0 09/06/2017 0750   PLT 274 01/05/2017 1359   MCV 95.9 09/06/2017 0750   MCV 97.5 01/05/2017 1359   MCH 33.5 01/05/2017 1359   MCH 33.4 12/04/2016 1548   MCHC 33.8 09/06/2017 0750   RDW 13.4 09/06/2017 0750   RDW 12.4 01/05/2017 1359   LYMPHSABS 3.0 01/05/2017 1359   MONOABS 0.6 01/05/2017 1359   EOSABS 0.1 01/05/2017 1359   BASOSABS 0.0 01/05/2017 1359    No results found for: POCLITH, LITHIUM   No results found for: PHENYTOIN, PHENOBARB, VALPROATE, CBMZ   .res Assessment: Plan:   Will change Dayvigo to Belsomra since patient reports that Oliva Bustard has been one of her more expensive medications, and she is unsure if is as effective as it initially was.  Patient  provided samples of Belsomra 20 mg po QHS for insomnia.  Prescription sent for Belsomra 20 mg p.o. nightly for insomnia.  Patient advised to contact office if Belsomra samples are not effective. Will continue BuSpar 30 mg twice daily for anxiety. Continue sertraline 200 mg po daily for anxiety and depression. Continue Remeron 15 mg at bedtime for insomnia and depression. Continue alprazolam as needed for anxiety. Patient to follow-up in 4 months or sooner if clinically indicated.  Monika was seen today for insomnia, anxiety and depression.  Diagnoses and all orders for this visit:  Primary insomnia -     Suvorexant (BELSOMRA) 20 MG TABS; Take 20 mg by mouth at bedtime.  Generalized anxiety disorder -     busPIRone (BUSPAR) 30 MG tablet; Take 1 tablet (30 mg total) by mouth 2 (two) times daily. -     sertraline (ZOLOFT) 100 MG tablet; TAKE 2 TABLETS(200 MG) BY MOUTH AT BEDTIME  MDD (recurrent major depressive disorder) -     sertraline (ZOLOFT) 100  MG tablet; TAKE 2 TABLETS(200 MG) BY MOUTH AT BEDTIME     Please see After Visit Summary for patient specific instructions.  Future Appointments  Date Time Provider Department Center  10/27/2019 11:40 AM Raulkar, Drema Pry, MD [facility]  01/12/2020 11:30 AM Corie Chiquito, PMHNP [facility] None    No orders of the defined types were placed in this encounter.   -------------------------------

## 2019-09-23 NOTE — Telephone Encounter (Signed)
Close encounter 

## 2019-10-11 ENCOUNTER — Other Ambulatory Visit: Payer: Self-pay | Admitting: Physical Medicine and Rehabilitation

## 2019-10-16 ENCOUNTER — Telehealth: Payer: Self-pay | Admitting: Psychiatry

## 2019-10-16 NOTE — Telephone Encounter (Signed)
Pt would like a refill on Dayvigo. Please send to [facility] on [address]. Pt has not used the other meds that was sent in.

## 2019-10-17 ENCOUNTER — Other Ambulatory Visit: Payer: Self-pay

## 2019-10-17 MED ORDER — DAYVIGO 10 MG PO TABS: 10.0000 mg | ORAL_TABLET | Freq: Every day | ORAL | 1 refills | Status: DC

## 2019-10-27 ENCOUNTER — Encounter
Payer: PRIVATE HEALTH INSURANCE | Attending: Physical Medicine and Rehabilitation | Admitting: Physical Medicine and Rehabilitation

## 2019-10-27 ENCOUNTER — Other Ambulatory Visit: Payer: Self-pay

## 2019-10-27 VITALS — BP 108/78 | HR 104 | Temp 98.2°F | Ht 65.0 in | Wt 162.4 lb

## 2019-10-27 DIAGNOSIS — M816 Localized osteoporosis [Lequesne]: Secondary | ICD-10-CM

## 2019-10-27 DIAGNOSIS — M47816 Spondylosis without myelopathy or radiculopathy, lumbar region: Secondary | ICD-10-CM | POA: Diagnosis present

## 2019-10-27 DIAGNOSIS — G894 Chronic pain syndrome: Secondary | ICD-10-CM | POA: Insufficient documentation

## 2019-10-27 DIAGNOSIS — M5442 Lumbago with sciatica, left side: Secondary | ICD-10-CM | POA: Diagnosis present

## 2019-10-27 DIAGNOSIS — Z5181 Encounter for therapeutic drug level monitoring: Secondary | ICD-10-CM | POA: Diagnosis present

## 2019-10-27 DIAGNOSIS — M797 Fibromyalgia: Secondary | ICD-10-CM | POA: Insufficient documentation

## 2019-10-27 DIAGNOSIS — M47817 Spondylosis without myelopathy or radiculopathy, lumbosacral region: Secondary | ICD-10-CM | POA: Diagnosis not present

## 2019-10-27 DIAGNOSIS — M67952 Unspecified disorder of synovium and tendon, left thigh: Secondary | ICD-10-CM | POA: Diagnosis present

## 2019-10-27 DIAGNOSIS — G8929 Other chronic pain: Secondary | ICD-10-CM | POA: Insufficient documentation

## 2019-10-27 DIAGNOSIS — Z79891 Long term (current) use of opiate analgesic: Secondary | ICD-10-CM | POA: Insufficient documentation

## 2019-10-27 DIAGNOSIS — M4807 Spinal stenosis, lumbosacral region: Secondary | ICD-10-CM | POA: Insufficient documentation

## 2019-10-27 DIAGNOSIS — I952 Hypotension due to drugs: Secondary | ICD-10-CM | POA: Diagnosis not present

## 2019-10-27 MED ORDER — METHOCARBAMOL 750 MG PO TABS: ORAL_TABLET | ORAL | 3 refills | Status: DC

## 2019-10-27 MED ORDER — TRAMADOL HCL 50 MG PO TABS: ORAL_TABLET | ORAL | 3 refills | Status: DC

## 2019-10-27 NOTE — Progress Notes (Signed)
Subjective:    Patient ID: Faith Thomas, female    DOB: 1961/05/24, 59 y.o.   MRN: [id number]  HPI  Mrs. Calandra returns for follow-up of her left sided facet arthropathy and lumbosacral spondylosis with neurogenic claudication.   She continues to feel her lower back and leg pain especially with activities such as vaccuming and mopping. This completely resolved for three days following her L3-L4 medial branch blocks and L5 dorsal ramus blocks with Dr. Wynn Banker on 3/30. Symptoms continue to limit her walking ability-she now walks about 10 minutes outside daily and she used to love to walk for an hour. She has had relief from the Robaxin which she takes regularly 3 times per day. She had hypomania when she tried Lyrica, a reaction to Cymbalta, and has never tried Amitriptyline. She takes the Tramadol 50mg  three times per day and is able to tolerate this well. She has tried Gabapentin 100mg  TID since last visit and felt GI upset and insomnia with this. She has not experienced adverse side effects to these medications.     Overall, she feels that he life is improved from the same time last year. She is very appreciative of her excellent medical care team. She has been doing daily yoga and meditation for stress relief, sometimes 2 hours. She has started a new job with the hours of 2 to midnight. She does still have some difficulty falling asleep at night.   Eventually she hopes to be able to get off of a lot of the medications that she is taking. She enjoys spending time with her granddaughter, aged 2.   She would be interested in proceeding with 2nd medial branch block given success of prior. She needs refills of her medications. Pain has improved to 5/10 this visit from 6/10 last visit.   Pain Inventory Average Pain 5 Pain Right Now 5 My pain is sharp, dull, stabbing, tingling and aching  In the last 24 hours, has pain interfered with the following? General activity 5 Relation with  others 5 Enjoyment of life 5 What TIME of day is your pain at its worst? varies Sleep (in general) Fair  Pain is worse with: bending, standing and some activites Pain improves with: rest, heat/ice, therapy/exercise, pacing activities and medication Relief from Meds: 5  Mobility how many minutes can you walk? 10 ability to climb steps?  yes do you drive?  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  no  Physicians involved in your care Any changes since last visit?  no Primary care . Psychiatrist . therpist   Family History  Problem Relation Age of Onset  . Arthritis Mother   . Hypertension Mother   . Mental illness Mother   . Anxiety disorder Mother   . Depression Mother   . Arthritis Father   . Hypertension Father   . Mental illness Father   . Anxiety disorder Father   . Depression Father   . Mental illness Brother   . ADD / ADHD Brother   . Psychosis Brother   . Post-traumatic stress disorder Brother   . Arthritis Daughter   . Autism Daughter   . Cancer Maternal Aunt   . Autism Maternal Aunt   . Hypertension Maternal Grandmother   . Arthritis Maternal Grandfather   .  Depression Cousin   . Anxiety disorder Cousin   . Depression Maternal Aunt    Social History   Socioeconomic History  . Marital status: Divorced    Spouse name: Not on file  . Number of children: Not on file  . Years of education: Not on file  . Highest education level: Not on file  Occupational History  . Not on file  Tobacco Use  . Smoking status: Never Smoker  . Smokeless tobacco: Never Used  Substance and Sexual Activity  . Alcohol use: Yes    Alcohol/week: 0.0 standard drinks    Comment: occasional   . Drug use: No  . Sexual activity: Not on file  Other Topics  Concern  . 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 Difficulty of Paying Living Expenses:   Food Insecurity:   . Worried About Programme researcher, broadcasting/film/video in the Last Year:   . Barista in the Last Year:   Transportation Needs:   . Freight forwarder (Medical):   Marland Kitchen Lack of Transportation (Non-Medical):   Physical Activity:   . Days of Exercise per Week:   . Minutes of Exercise per Session:   Stress:   . Feeling of Stress :   Social Connections:   . Frequency of Communication with Friends and Family:   . Frequency of Social Gatherings with Friends and Family:   . Attends Religious Services:   . Active Member of Clubs or Organizations:   . Attends Banker Meetings:   Marland Kitchen Marital Status:    No past surgical history on file. Past Medical History:  Diagnosis Date  . Allergy   . Depression   . DVT (deep venous thrombosis) (HCC)   . Fibromyalgia   . Genetic defect   . GERD (gastroesophageal reflux disease)   . History of IBS   . 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 Hypertension   . Osteoporosis   . PE (pulmonary thromboembolism) (HCC)   . Positive TB test   . Raynaud disease   . Scoliosis   . Urine incontinence    BP 108/78   Pulse (!) 104   Temp 98.2 F (36.8 C)   Ht 5\' 5"  (1.651 m)   Wt 162 lb 6.4 oz (73.7 kg)   SpO2 98%   BMI 27.02 kg/m   Opioid Risk Score:   Fall Risk Score:  `1  Depression screen PHQ 2/9  Depression screen [facility] 2/9 04/15/2019 01/07/2018 09/06/2017  Decreased Interest 0 3 0  Down, Depressed, Hopeless 0 3 2  PHQ - 2 Score 0 6 2    Review of Systems  Constitutional: Positive for unexpected weight change.  Gastrointestinal: Positive for abdominal pain, constipation and diarrhea.  Psychiatric/Behavioral: Positive for dysphoric mood. The patient is nervous/anxious.    All other systems reviewed and are negative.      Objective:   Physical Exam Gen: no distress, normal appearing HEENT: oral mucosa pink and moist, NCAT Cardio: Reg rate Chest: normal effort, normal rate of breathing Abd: soft, non-distended Ext: no edema Skin: intact Neuro/Musculoskeletal: Normal gait. FROM upon flexion, pain reproduced in bilateral low back upon extension. 5/5 motor strength throughout. + Slump test on left. Sensation intact throughout. +muscle spasms with trigger points in lumbar and thoracic paraspinals.  Psych: pleasant,  normal affect     Assessment & Plan:  1) Lumbar spondylosis with neurogenic claudication, left side 2) Bilateral facet joint arthritis, lumbar  -Mrs. Wilczak's chief complaint today is lower back pain with left sided sciatica in the L5 distribution. On physical exam, she has painless FROM on flexion with her bilateral low back pain. On last visit this was reproduced with extension but currently she has full and painless extension. Her pain has been most consistent with L5 nerve root impingementsecondary to stenosisand facet arthropathy, which is seen on her most recent MRI, which shows worsening facet arthropathy as well as left sided annular fissure; results reviewed and discussed with patient previously. She would benefit and is agreeable to a course of physical therapy focused on core strengthening. She would like to hold off on starting this until COVID-19 cases have reduced. For now, she will continue her daily yoga practice, which has been shown to help low back pain.   -Provided refills of Robaxin to 750mg  TID and Tramadol 50mg  TID PRN which are working for her and which she is tolerating without side effects.    -Will schedule her for repeat left sided lumbar medial branch blocks with Dr. Letta Pate on 6/30, 3 months from prior injection. Advised that she stop Eliquis 3 days prior.   3) Fibromyalgia -Patient's symptoms have greatly  improved with her change in job, allowing her to sleep better. Continue daily yoga and meditation for stress relief. Discussed the goal of increasing her aerobic exercise to 15 minutes of daily outdoor walking for now.  3) Osteoporosis: Counseled regarding supplements, diet, and exercise to improve bone density. She is also following with rheumatology and on Fosfomax, as well as with a dietician. She has been following diet plan well.  4) Hypotension: SBP continues to be low in office. Advised that she log her BP daily and bring reads to follow-up appointment with me or PCP so medication can be appropriately adjusted.   --Follow up in 2 months for continued management of above conditions.   All questions were encouraged and answered.RTC in 2 months.

## 2019-10-28 ENCOUNTER — Encounter: Payer: Self-pay | Admitting: Physical Medicine and Rehabilitation

## 2019-11-04 ENCOUNTER — Other Ambulatory Visit: Payer: Self-pay

## 2019-11-04 DIAGNOSIS — F5101 Primary insomnia: Secondary | ICD-10-CM

## 2019-11-04 DIAGNOSIS — F3340 Major depressive disorder, recurrent, in remission, unspecified: Secondary | ICD-10-CM

## 2019-11-04 MED ORDER — MIRTAZAPINE 15 MG PO TABS: ORAL_TABLET | ORAL | 0 refills | Status: DC

## 2019-11-20 ENCOUNTER — Encounter: Payer: Self-pay | Admitting: Physical Medicine and Rehabilitation

## 2019-11-20 ENCOUNTER — Other Ambulatory Visit: Payer: Self-pay

## 2019-11-20 DIAGNOSIS — M47817 Spondylosis without myelopathy or radiculopathy, lumbosacral region: Secondary | ICD-10-CM

## 2019-11-20 DIAGNOSIS — F411 Generalized anxiety disorder: Secondary | ICD-10-CM

## 2019-11-21 MED ORDER — ALPRAZOLAM 0.5 MG PO TABS: ORAL_TABLET | ORAL | 2 refills | Status: DC

## 2019-12-04 ENCOUNTER — Encounter
Payer: PRIVATE HEALTH INSURANCE | Attending: Physical Medicine and Rehabilitation | Admitting: Physical Medicine & Rehabilitation

## 2019-12-04 ENCOUNTER — Other Ambulatory Visit: Payer: Self-pay

## 2019-12-04 ENCOUNTER — Other Ambulatory Visit: Payer: Self-pay | Admitting: *Deleted

## 2019-12-04 ENCOUNTER — Encounter: Payer: Self-pay | Admitting: Physical Medicine & Rehabilitation

## 2019-12-04 VITALS — BP 109/76 | HR 86 | Temp 98.5°F | Ht 65.0 in | Wt 162.0 lb

## 2019-12-04 DIAGNOSIS — M4807 Spinal stenosis, lumbosacral region: Secondary | ICD-10-CM | POA: Insufficient documentation

## 2019-12-04 DIAGNOSIS — Z79891 Long term (current) use of opiate analgesic: Secondary | ICD-10-CM | POA: Insufficient documentation

## 2019-12-04 DIAGNOSIS — M47817 Spondylosis without myelopathy or radiculopathy, lumbosacral region: Secondary | ICD-10-CM

## 2019-12-04 DIAGNOSIS — Z5181 Encounter for therapeutic drug level monitoring: Secondary | ICD-10-CM | POA: Insufficient documentation

## 2019-12-04 DIAGNOSIS — G8929 Other chronic pain: Secondary | ICD-10-CM | POA: Diagnosis present

## 2019-12-04 DIAGNOSIS — G894 Chronic pain syndrome: Secondary | ICD-10-CM | POA: Insufficient documentation

## 2019-12-04 DIAGNOSIS — M67952 Unspecified disorder of synovium and tendon, left thigh: Secondary | ICD-10-CM | POA: Insufficient documentation

## 2019-12-04 DIAGNOSIS — M47816 Spondylosis without myelopathy or radiculopathy, lumbar region: Secondary | ICD-10-CM | POA: Diagnosis present

## 2019-12-04 DIAGNOSIS — M5442 Lumbago with sciatica, left side: Secondary | ICD-10-CM | POA: Insufficient documentation

## 2019-12-04 DIAGNOSIS — M816 Localized osteoporosis [Lequesne]: Secondary | ICD-10-CM | POA: Diagnosis present

## 2019-12-04 DIAGNOSIS — M797 Fibromyalgia: Secondary | ICD-10-CM | POA: Insufficient documentation

## 2019-12-04 NOTE — Progress Notes (Signed)
Left Lumbar L3, L4  medial branch blocks and L 5 dorsal ramus injection under fluoroscopic guidance   Indication: Left Lumbar pain which is not relieved by medication management or other conservative care and interfering with self-care and mobility.  Informed consent was obtained after describing risks and benefits of the procedure with the patient, this includes bleeding, bruising, infection, paralysis and medication side effects.  The patient wishes to proceed and has given written consent.  The patient was placed in a prone position.  The lumbar area was marked and prepped with Betadine.  One mL of 1% lidocaine was injected into each of 3 areas into the skin and subcutaneous tissue.  Then a 22-gauge 3.5in spinal needle was inserted targeting the junction of the left S1 superior articular process and sacral ala junction.  Needle was advanced under fluoroscopic guidance.  Bone contact was made. Isovue 200 was injected x 0.5 mL demonstrating no intravascular uptake.  Then a solution containing  2% MPF lidocaine was injected x 0.5 mL.  Then the left L5 superior articular process in transverse process junction was targeted.  Bone contact was made. Isovue 200 was injected x 0.5 mL demonstrating no intravascular uptake.  Then a solution containing 2% MPF lidocaine was injected x 0.5 mL.  Then the left L4 superior articular process in transverse process junction was targeted.  Bone contact was made.  Isovue 200 was injected x 0.5 mL demonstrating no intravascular uptake.  Then a solution containing  2% MPF lidocaine was injected x 0.5 mL.  Patient tolerated procedure well.  Post procedure instructions were given. Pre injection pain 6/10 Post injection pain 2-3/10

## 2019-12-04 NOTE — Progress Notes (Signed)
  PROCEDURE RECORD [facility]   Name: Faith Thomas DOB:1960-12-26 MRN: [id number]  Date:12/04/2019  Physician: Claudette Laws, MD    Nurse/CMA: Jaden Batchelder, CMA   Allergies:  Allergies  Allergen Reactions  . Citalopram Tinitus  . Escitalopram Tinitus  . Bee Venom Swelling  . Latex Swelling  . Penicillins Swelling and Rash    Has patient had a PCN reaction causing immediate rash, facial/tongue/throat swelling, SOB or lightheadedness with hypotension: Yes Has patient had a PCN reaction causing severe rash involving mucus membranes or skin necrosis: No Has patient had a PCN reaction that required hospitalization: No Has patient had a PCN reaction occurring within the last 10 years: No If all of the above answers are "NO", then may proceed with Cephalosporin use.  Marland Kitchen Cymbalta [Duloxetine Hcl]     -memory loss  . Escitalopram Oxalate Tinitus  . Other     Yeast creams- causes secondary infections, itching, burning, redness   . Progesterone     Pt feels caused weakness, fatigue, cognitive deficits  . Trintellix [Vortioxetine]     Memory problems  . Bupropion Other (See Comments)    weakness and malaise.  . Duloxetine Other (See Comments)    Consent Signed: Yes.    Is patient diabetic? No.  CBG today?   Pregnant: No. LMP: No LMP recorded. Patient is postmenopausal. (age 48-55)  Anticoagulants: yes (eliquis) Anti-inflammatory: no Antibiotics: no  Procedure: left L3,4,5 Medial Branch Block  Position: Prone Start Time: 12:09pm  End Time 12:18pm:   Fluoro Time: 21s   RN/CMA Armonie Mettler, MA Anneli Bing, CMA    Time 12:00pm 12:20pm    BP 109/76 123/84    Pulse 86 78    Respirations 14 14    O2 Sat 98 97    S/S 6 6    Pain Level 6/10 3/10     D/C home with self, patient A & O X 3, D/C instructions reviewed, and sits independently.

## 2019-12-04 NOTE — Patient Instructions (Signed)

## 2019-12-15 ENCOUNTER — Other Ambulatory Visit: Payer: Self-pay | Admitting: Physical Medicine and Rehabilitation

## 2019-12-25 ENCOUNTER — Encounter (HOSPITAL_BASED_OUTPATIENT_CLINIC_OR_DEPARTMENT_OTHER): Payer: PRIVATE HEALTH INSURANCE | Admitting: Physical Medicine & Rehabilitation

## 2019-12-25 ENCOUNTER — Other Ambulatory Visit: Payer: Self-pay

## 2019-12-25 ENCOUNTER — Encounter: Payer: Self-pay | Admitting: Physical Medicine & Rehabilitation

## 2019-12-25 VITALS — BP 112/77 | HR 77 | Temp 98.4°F | Ht 65.0 in | Wt 164.0 lb

## 2019-12-25 DIAGNOSIS — M47817 Spondylosis without myelopathy or radiculopathy, lumbosacral region: Secondary | ICD-10-CM

## 2019-12-25 NOTE — Progress Notes (Signed)
  PROCEDURE RECORD [facility]   Name: Faith Thomas DOB:05/02/1961 MRN: [id number]  Date:12/25/2019  Physician: Claudette Laws, MD    Nurse/CMA: Santoria Chason, CMA  Allergies:  Allergies  Allergen Reactions  . Citalopram Tinitus  . Escitalopram Tinitus  . Bee Venom Swelling  . Latex Swelling  . Penicillins Swelling and Rash    Has patient had a PCN reaction causing immediate rash, facial/tongue/throat swelling, SOB or lightheadedness with hypotension: Yes Has patient had a PCN reaction causing severe rash involving mucus membranes or skin necrosis: No Has patient had a PCN reaction that required hospitalization: No Has patient had a PCN reaction occurring within the last 10 years: No If all of the above answers are "NO", then may proceed with Cephalosporin use.  Marland Kitchen Cymbalta [Duloxetine Hcl]     -memory loss  . Escitalopram Oxalate Tinitus  . Other     Yeast creams- causes secondary infections, itching, burning, redness   . Progesterone     Pt feels caused weakness, fatigue, cognitive deficits  . Trintellix [Vortioxetine]     Memory problems  . Bupropion Other (See Comments)    weakness and malaise.  . Duloxetine Other (See Comments)    Consent Signed: Yes.    Is patient diabetic? No.  CBG today?   Pregnant: No. LMP: No LMP recorded. Patient is postmenopausal. (age 24-55)  Anticoagulants: yes (eliquis) Anti-inflammatory: no Antibiotics: no  Procedure: left L3,4,5 radiofrequency ablation  Position: Prone Start Time: 1:49pm  End Time: 2:10pm  Fluoro Time: 51s   RN/CMA Denard Tuminello, CMA Lashan Gluth, CMA    Time 1:30pm 2:15pm    BP 112/77 127/88    Pulse 77 75    Respirations 14 14    O2 Sat 97 98    S/S 6 6    Pain Level 7/10 4/10     D/C home with selfpatient A & O X 3, D/C instructions reviewed, and sits independently.

## 2019-12-25 NOTE — Patient Instructions (Signed)
You had a radio frequency procedure today This was done to alleviate joint pain in your lumbar area We injected lidocaine which is a local anesthetic.  You may experience soreness at the injection sites. You may also experienced some irritation of the nerves that were heated I'm recommending ice for 30 minutes every 2 hours as needed for the next 24-48 hours   

## 2019-12-25 NOTE — Progress Notes (Signed)
Left L5 dorsal ramus., left L4 and left L3 medial branch radio frequency neurotomy under fluoroscopic guidance  Indication: Low back pain due to lumbar spondylosis which has been relieved on 2 occasions by greater than 50% by lumbar medial branch blocks at corresponding levels.  Informed consent was obtained after describing risks and benefits of the procedure with the patient, this includes bleeding, bruising, infection, paralysis and medication side effects. The patient wishes to proceed and has given written consent. The patient was placed in a prone position. The lumbar and sacral area was marked and prepped with Betadine. A 25-gauge 1-1/2 inch needle was inserted into the skin and subcutaneous tissue at 3 sites in one ML of 2% lidocaine was injected into each site. Then a 18-gauge 10 cm radio frequency needle with a 1 cm curved active tip was inserted targeting the left S1 SAP/sacral ala junction. Bone contact was made and confirmed with lateral imaging.  motor stimulation at 2 Hz confirm proper needle location followed by injection of one ML of 2% MPF lidocaine. Then the left L5 SAP/transverse process junction was targeted. Bone contact was made and confirmed with lateral imaging motor stimulation at 2 Hz confirm proper needle location followed by injection of one ML of the solution containing one ML of  2% MPF lidocaine. Then the left L4 SAP/transverse process junction was targeted. Bone contact was made and confirmed with lateral imaging. motor stimulation at 2 Hz confirm proper needle location followed by injection of one ML of the solution containing one ML of2% MPF lidocaine. Radio frequency lesion  at 80C for 90 seconds was performed. Needles were removed. Post procedure instructions and vital signs were performed. Patient tolerated procedure well. Followup appointment was given.  

## 2020-01-09 ENCOUNTER — Other Ambulatory Visit: Payer: Self-pay

## 2020-01-09 MED ORDER — DAYVIGO 10 MG PO TABS: 10.0000 mg | ORAL_TABLET | Freq: Every day | ORAL | 0 refills | Status: DC

## 2020-01-12 ENCOUNTER — Encounter: Payer: Self-pay | Admitting: Psychiatry

## 2020-01-12 ENCOUNTER — Telehealth: Payer: Self-pay | Admitting: Psychiatry

## 2020-01-12 ENCOUNTER — Telehealth (INDEPENDENT_AMBULATORY_CARE_PROVIDER_SITE_OTHER): Payer: PRIVATE HEALTH INSURANCE | Admitting: Psychiatry

## 2020-01-12 VITALS — Wt 159.0 lb

## 2020-01-12 DIAGNOSIS — F3340 Major depressive disorder, recurrent, in remission, unspecified: Secondary | ICD-10-CM

## 2020-01-12 DIAGNOSIS — F411 Generalized anxiety disorder: Secondary | ICD-10-CM | POA: Diagnosis not present

## 2020-01-12 DIAGNOSIS — F5101 Primary insomnia: Secondary | ICD-10-CM | POA: Diagnosis not present

## 2020-01-12 MED ORDER — SERTRALINE HCL 100 MG PO TABS: ORAL_TABLET | ORAL | 1 refills | Status: DC

## 2020-01-12 MED ORDER — TRAZODONE HCL 100 MG PO TABS: ORAL_TABLET | ORAL | 1 refills | Status: DC

## 2020-01-12 MED ORDER — BUSPIRONE HCL 30 MG PO TABS: 30.0000 mg | ORAL_TABLET | Freq: Two times a day (BID) | ORAL | 1 refills | Status: DC

## 2020-01-12 NOTE — Progress Notes (Signed)
Faith BadeMary Anna Scoggin [id number] February 13, 1961 59 y.o.  Virtual Visit via Video Note  I connected with pt @ on 01/12/20 at 11:30 AM EDT by a video enabled telemedicine application and verified that I am speaking with the correct person using two identifiers.   I discussed the limitations of evaluation and management by telemedicine and the availability of in person appointments. The patient expressed understanding and agreed to proceed.  I discussed the assessment and treatment plan with the patient. The patient was provided an opportunity to ask questions and all were answered. The patient agreed with the plan and demonstrated an understanding of the instructions.   The patient was advised to call back or seek an in-person evaluation if the symptoms worsen or if the condition fails to improve as anticipated.  I provided 50 minutes of non-face-to-face time during this encounter.  The patient was located at home.  [facility]   Corie ChiquitoJessica Kacee Sukhu, PMHNP   Subjective:   Patient ID:  Faith Thomas is a 59 y.o. (DOB February 13, 1961) female.  Chief Complaint:  Chief Complaint  Patient presents with  . Insomnia  . Follow-up    h/o anxiety, depression    HPI Faith Thomas presents for follow-up of anxiety, depression, and insomnia. She has continued to see Aquilla SolianAmanda Vaughn for therapy. Has been using a meditation ap. She reports that she has some occasional difficulty falling and staying asleep, particularly if she has something on her mind. Has been using Dayvigo. She reports that sleep is improved but not optimal. She reports that her mood has been "good." She reports improved anxiety with andusing meditation and therapy. She reports that she has been using different coping strategies to set limits with family. She notices "the less involved I am with my family, the less anxious I am." She has been able to increase walking with improvement in pain.  Motivation has been ok. She reports gaining 40 lbs in the last 3 years. She reports that she experiences some rumination later in the evening and this triggers eating. Has lost 4 lbs with Noom. She reports that her appetite is ok during the daytime. Concentration has been improving. She reports that she has been more interested in things. Denies SI.   Has been continuing to work from home.  [place]   Reports that she is taking Xanax 1-2 tabs po QHS prn.    Past Medication Trials: Buspar-Effective Trintellix- memory difficulties Sertraline- Effective Cymbalta- memory difficulties Prozac-stomach pain  Paxil Celexa Lexapro Remeron- Effective Wellbutrin XL- Irritability Lamictal- ineffective Klonopin Xanax Doxepin Trazodone- stopped working AvnetDayvigo Deplin Lyrica- hypomania Gabapentin- sleep disturbance Amitriptyline-sleeplessness, ineffective.   Review of Systems:  Review of Systems  HENT: Positive for congestion.   Respiratory: Positive for cough.   Gastrointestinal:       She reports improve GI s/s. Continues to work with dietician  Musculoskeletal: Negative for gait problem.  Psychiatric/Behavioral:       Please refer to HPI    She reports that her pain has improved.  Medications: I have reviewed the patient's current medications.  Current Outpatient Medications  Medication Sig Dispense Refill  . acetaminophen (TYLENOL) 500 MG tablet Take 1,000 mg by mouth every 8 (eight) hours as needed.     . ALPRAZolam (XANAX) 0.5 MG tablet TAKE 1 TAB PO QHS AND 1/2-1 TABLET BY MOUTH TWICE DAILY AS NEEDED FOR ANXIETY 75 tablet 2  . amitriptyline (ELAVIL) 10 MG  tablet Take 1 tablet (10 mg total) by mouth at bedtime. 30 tablet 0  . apixaban (ELIQUIS) 5 MG TABS tablet Take 1 tablet (5 mg total) by mouth 2 (two) times daily. 180 tablet 1  . b complex vitamins capsule Take 1 capsule by mouth daily.    . busPIRone (BUSPAR) 30 MG tablet Take 1 tablet  (30 mg total) by mouth 2 (two) times daily. 180 tablet 1  . CALCIUM PO Take by mouth.    . Cholecalciferol (VITAMIN D-3) 1000 units CAPS Take 10,000 Units by mouth daily.     Marland Kitchen L-Methylfolate-Algae (DEPLIN 15) 15-90.314 MG CAPS TAKE 1 CAPSULE BY MOUTH EVERY DAY 90 capsule 2  . Lemborexant (DAYVIGO) 10 MG TABS Take 10 mg by mouth at bedtime. 30 tablet 0  . lisinopril (PRINIVIL,ZESTRIL) 10 MG tablet TAKE 1 TABLET BY MOUTH EVERY DAY 34 tablet 2  . methocarbamol (ROBAXIN) 750 MG tablet TAKE 1 TABLET(750 MG) BY MOUTH THREE TIMES DAILY 90 tablet 3  . mometasone (NASONEX) 50 MCG/ACT nasal spray USE ONE SPRAY IN EACH NOSTRIL ONE TIME DAILY (Patient taking differently: daily as needed. ) 17 g 5  . montelukast (SINGULAIR) 10 MG tablet Take 10 mg by mouth at bedtime.    . Multiple Vitamins-Minerals (ZINC PO) Take by mouth.    . Omega-3 Fatty Acids (OMEGA 3 PO) Take by mouth.    . Probiotic Product (PROBIOTIC PO) Take 1 capsule by mouth daily.     Marland Kitchen QUERCETIN PO Take by mouth.    . sertraline (ZOLOFT) 100 MG tablet TAKE 2 TABLETS(200 MG) BY MOUTH AT BEDTIME 180 tablet 1  . sucralfate (CARAFATE) 1 g tablet Take 1 g by mouth 2 (two) times daily as needed.     . traMADol (ULTRAM) 50 MG tablet TAKE 1 TABLET(50 MG) BY MOUTH THREE TIMES DAILY 90 tablet 3  . VITAMIN A PO Take by mouth.    . vitamin C (ASCORBIC ACID) 500 MG tablet Take 1,000 mg by mouth daily.     Marland Kitchen ELIQUIS 5 MG TABS tablet TAKE 1 TABLET (5 MG TOTAL) BY MOUTH 2 (TWO) TIMES DAILY WITH A MEAL. 60 tablet 0  . estradiol (ESTRACE) 0.1 MG/GM vaginal cream Place 1 Applicatorful vaginally at bedtime. (Patient not taking: Reported on 01/12/2020)    . traZODone (DESYREL) 100 MG tablet Take 1/2-1 tablet po QHS prn insomnia 90 tablet 1   No current facility-administered medications for this visit.    Medication Side Effects: Other: Possible wt gain  Allergies:  Allergies  Allergen Reactions  . Citalopram Tinitus  . Escitalopram Tinitus  . Bee Venom  Swelling  . Latex Swelling  . Penicillins Swelling and Rash    Has patient had a PCN reaction causing immediate rash, facial/tongue/throat swelling, SOB or lightheadedness with hypotension: Yes Has patient had a PCN reaction causing severe rash involving mucus membranes or skin necrosis: No Has patient had a PCN reaction that required hospitalization: No Has patient had a PCN reaction occurring within the last 10 years: No If all of the above answers are "NO", then may proceed with Cephalosporin use.  Marland Kitchen Cymbalta [Duloxetine Hcl]     -memory loss  . Escitalopram Oxalate Tinitus  . Other     Yeast creams- causes secondary infections, itching, burning, redness   . Progesterone     Pt feels caused weakness, fatigue, cognitive deficits  . Trintellix [Vortioxetine]     Memory problems  . Bupropion Other (See Comments)  weakness and malaise.  . Duloxetine Other (See Comments)    Past Medical History:  Diagnosis Date  . Allergy   . Depression   . DVT (deep venous thrombosis) (HCC)   . Fibromyalgia   . Genetic defect   . GERD (gastroesophageal reflux disease)   . History of IBS   . 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 Hypertension   . Osteoporosis   . PE (pulmonary thromboembolism) (HCC)   . Positive TB test   . Raynaud disease   . Scoliosis   . Urine incontinence     Family History  Problem Relation Age of Onset  . Arthritis Mother   . Hypertension Mother   . Mental illness Mother   . Anxiety disorder Mother   . Depression Mother   . Arthritis Father   . Hypertension Father   . Mental illness Father   . Anxiety disorder Father   . Depression Father   . Mental illness Brother   . ADD / ADHD Brother   . Psychosis Brother   . Post-traumatic stress disorder Brother   . Arthritis Daughter   . Autism Daughter   . Cancer Maternal Aunt   . Autism Maternal Aunt   . Hypertension Maternal Grandmother   . Arthritis Maternal Grandfather   . Depression  Cousin   . Anxiety disorder Cousin   . Depression Maternal Aunt     Social History   Socioeconomic History  . Marital status: Divorced    Spouse name: Not on file  . Number of children: Not on file  . Years of education: Not on file  . Highest education level: Not on file  Occupational History  . Not on file  Tobacco Use  . Smoking status: Never Smoker  . Smokeless tobacco: Never Used  Vaping Use  . Vaping Use: Never used  Substance and Sexual Activity  . Alcohol use: Yes    Alcohol/week: 0.0 standard drinks    Comment: occasional   . Drug use: No  . Sexual activity: Not on file  Other Topics Concern  . 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 Difficulty of Paying Living Expenses:   Food Insecurity:   . Worried About Programme researcher, broadcasting/film/video in the Last Year:   . Barista in the Last Year:   Transportation Needs:   . Freight forwarder (Medical):   Marland Kitchen Lack of Transportation (Non-Medical):   Physical Activity:   . Days of Exercise per Week:   . Minutes of Exercise per Session:   Stress:   . Feeling of Stress :   Social Connections:   . Frequency of Communication with Friends and Family:   . Frequency of Social Gatherings with Friends and Family:   . Attends Religious Services:   . Active Member of Clubs or Organizations:   . Attends Banker Meetings:   Marland Kitchen Marital Status:   Intimate Partner Violence:   . Fear of Current or Ex-Partner:   . Emotionally Abused:   Marland Kitchen Physically Abused:   . Sexually Abused:     Past Medical History, Surgical history, Social history, and Family history were reviewed and updated as appropriate.   Please see review of systems for further details on the patient's review from today.   Objective:  Physical Exam:  Wt 159 lb (72.1 kg)   BMI 26.46 kg/m   Physical  Exam Neurological:     Mental Status: She is alert and oriented to person, place, and time.     Cranial Nerves: No dysarthria.  Psychiatric:        Attention and Perception: Attention and perception normal.        Mood and Affect: Mood normal.        Speech: Speech normal.        Behavior: Behavior is cooperative.        Thought Content: Thought content normal. Thought content is not paranoid or delusional. Thought content does not include homicidal or suicidal ideation. Thought content does not include homicidal or suicidal plan.        Cognition and Memory: Cognition and memory normal.        Judgment: Judgment normal.     Comments: Insight intact     Lab Review:     Component Value Date/Time   NA 137 09/06/2017 0750   NA 134 (L) 01/05/2017 1359   K 4.5 09/06/2017 0750   K 3.9 01/05/2017 1359   CL 100 09/06/2017 0750   CO2 29 09/06/2017 0750   CO2 25 01/05/2017 1359   GLUCOSE 81 09/06/2017 0750   GLUCOSE 106 01/05/2017 1359   BUN 17 09/06/2017 0750   BUN 10.2 01/05/2017 1359   CREATININE 0.85 09/06/2017 0750   CREATININE 0.8 01/05/2017 1359   CALCIUM 9.7 09/06/2017 0750   CALCIUM 9.5 01/05/2017 1359   PROT 6.4 01/05/2017 1359   ALBUMIN 4.2 01/05/2017 1359   AST 18 01/05/2017 1359   ALT 15 01/05/2017 1359   ALKPHOS 60 01/05/2017 1359   BILITOT 0.22 01/05/2017 1359   GFRNONAA >60 12/04/2016 1548   GFRAA >60 12/04/2016 1548       Component Value Date/Time   WBC 5.2 09/06/2017 0750   RBC 4.29 09/06/2017 0750   HGB 13.9 09/06/2017 0750   HGB 13.4 01/05/2017 1359   HCT 41.1 09/06/2017 0750   HCT 39.0 01/05/2017 1359   PLT 287.0 09/06/2017 0750   PLT 274 01/05/2017 1359   MCV 95.9 09/06/2017 0750   MCV 97.5 01/05/2017 1359   MCH 33.5 01/05/2017 1359   MCH 33.4 12/04/2016 1548   MCHC 33.8 09/06/2017 0750   RDW 13.4 09/06/2017 0750   RDW 12.4 01/05/2017 1359   LYMPHSABS 3.0 01/05/2017 1359   MONOABS 0.6 01/05/2017 1359   EOSABS 0.1 01/05/2017 1359   BASOSABS  0.0 01/05/2017 1359    No results found for: POCLITH, LITHIUM   No results found for: PHENYTOIN, PHENOBARB, VALPROATE, CBMZ   .res Assessment: Plan:   Pt seen for 50 minutes and time spent counseling pt re: tx options for insomnia since she reports that insomnia has improved, however she continues to have diminished sleep at times and is concerned about cost of Dayvigo. Discussed potential benefits, risks, and side effects of other treatment options for insomnia to include Trazodone, Belsomra, and Silenor. Discussed re-trial of Trazodone since it was initially helpful for her insomnia. Discussed that she may need to periodically alternate medications for sleep if meds seem to lose efficacy after extended periods of time. Will re-trial Trazodone and pt reports that she may take Dayvigo prn when Trazodone is not as effective.  Discussed that Remeron may be contributing to wt gain and discussed benefits vs. Risks. Pt reports that she would like to attempt to stop Remeron. Discussed Remeron could  be re-started if she experiences worsening in sleep, anxiety, or depression.  Decrease Remeron to 1/2 tab for 4 nights, then stop. Continue Sertraline 200 mg po qd for depression and anxiety. Continue Buspar 30 mg po BID for anxiety.  Pt to f/u in 2 months or sooner if clinically indicated.  Patient advised to contact office with any questions, adverse effects, or acute worsening in signs and symptoms. Recommend continuing psychotherapy. Faith Thomas was seen today for insomnia and follow-up.  Diagnoses and all orders for this visit:  Primary insomnia -     traZODone (DESYREL) 100 MG tablet; Take 1/2-1 tablet po QHS prn insomnia  Generalized anxiety disorder -     sertraline (ZOLOFT) 100 MG tablet; TAKE 2 TABLETS(200 MG) BY MOUTH AT BEDTIME -     busPIRone (BUSPAR) 30 MG tablet; Take 1 tablet (30 mg total) by mouth 2 (two) times daily.  MDD (recurrent major depressive disorder) -     sertraline (ZOLOFT) 100  MG tablet; TAKE 2 TABLETS(200 MG) BY MOUTH AT BEDTIME     Please see After Visit Summary for patient specific instructions.  Future Appointments  Date Time Provider Department Center  01/27/2020 11:40 AM Raulkar, Drema Pry, MD [facility]    No orders of the defined types were placed in this encounter.     -------------------------------

## 2020-01-12 NOTE — Telephone Encounter (Signed)
[name], Faith Thomas are scheduled for a virtual visit with your provider today.    Just as we do with appointments in the office, we must obtain your consent to participate.  Your consent will be active for this visit and any virtual visit you may have with one of our providers in the next 365 days.    If you have a MyChart account, I can also send a copy of this consent to you electronically.  All virtual visits are billed to your insurance company just like a traditional visit in the office.  As this is a virtual visit, video technology does not allow for your provider to perform a traditional examination.  This may limit your provider's ability to fully assess your condition.  If your provider identifies any concerns that need to be evaluated in person or the need to arrange testing such as labs, EKG, etc, we will make arrangements to do so.    Although advances in technology are sophisticated, we cannot ensure that it will always work on either your end or our end.  If the connection with a video visit is poor, we may have to switch to a telephone visit.  With either a video or telephone visit, we are not always able to ensure that we have a secure connection.   I need to obtain your verbal consent now.   Are you willing to proceed with your visit today?   Faith Thomas has provided verbal consent on 01/12/2020 for a virtual visit (video or telephone).   Corie Chiquito, PMHNP 01/12/2020  11:39 AM

## 2020-01-26 ENCOUNTER — Encounter: Payer: Self-pay | Admitting: Psychiatry

## 2020-01-26 ENCOUNTER — Telehealth (INDEPENDENT_AMBULATORY_CARE_PROVIDER_SITE_OTHER): Payer: PRIVATE HEALTH INSURANCE | Admitting: Psychiatry

## 2020-01-26 DIAGNOSIS — F411 Generalized anxiety disorder: Secondary | ICD-10-CM

## 2020-01-26 DIAGNOSIS — F410 Panic disorder [episodic paroxysmal anxiety] without agoraphobia: Secondary | ICD-10-CM

## 2020-01-26 MED ORDER — PROPRANOLOL HCL 10 MG PO TABS: ORAL_TABLET | ORAL | 1 refills | Status: DC

## 2020-01-26 NOTE — Progress Notes (Signed)
Faith Thomas [id number] 07/27/1960 59 y.o.  Virtual Visit via Video Note  I connected with pt @ on 01/26/20 at 11:30 AM EDT by a video enabled telemedicine application and verified that I am speaking with the correct person using two identifiers.   I discussed the limitations of evaluation and management by telemedicine and the availability of in person appointments. The patient expressed understanding and agreed to proceed.  I discussed the assessment and treatment plan with the patient. The patient was provided an opportunity to ask questions and all were answered. The patient agreed with the plan and demonstrated an understanding of the instructions.   The patient was advised to call back or seek an in-person evaluation if the symptoms worsen or if the condition fails to improve as anticipated.  I provided 30 minutes of non-face-to-face time during this encounter.  The patient was located at home.  The provider was located at [facility].   Corie Chiquito, PMHNP   Subjective:   Patient ID:  Faith Thomas is a 59 y.o. (DOB 22-Jun-1960) female.  Chief Complaint:  Chief Complaint  Patient presents with  . Panic Attack    HPI Dinia Joynt presents emergently for anxiety. She reports that she has been having frequent panic attacks. She reports that she had another sinus infection, which was her 5th sinus infection since March. She reports that she is concerned about her immune system. She has a friend with COVID in ICU that is in critical condition. She has also had significant changes at work that are making her work more challenging. She has been told that she may need to go to the office if technology does not work and she has concerns about covid exposure. Mother has lost her home care. Pt has been having SOB with going up stairs which is concerning because she had similar s/s with pulmonary emboli in the past. She reports that she did not have any panic  attacks over the weekend. Reports having panic attacks at least once a day when working. She reports that she has anxiety about not being able to respond appropriately in a crisis situation.  Reports that she is using all of her coping strategies to manage anxiety.   She reports that Xanax is helpful for her anxiety but can cause some drowsiness.   She reports that she has been awakening frequently, about 5 times a night, and is typically able to return to sleep. Falling asleep more easily. Has been journaling before bedtime and using meditation and yoga. " I feel depressed when I think about my parents" making decisions that can have negative consequences and result in her having increased dilemmas. She reports, "a sense of imminent problem." She reports that her appetite has been ok and has been intentionally trying to lose weight. Energy varies. Reports feeling more tired the day after increased activity. Motivation has been ok. Accomplishing more things at home. Concentration has been decreased and is distracted by anxious thoughts. Denies SI.   Reports that she tried Trazodone while she had a sinus infection and had significant sleeplessness.   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  Past Medication Trials: Buspar-Effective Trintellix- memory difficulties Sertraline- Effective Cymbalta- memory difficulties Prozac-stomach pain  Paxil Celexa Lexapro Remeron- Effective Wellbutrin XL- Irritability Lamictal- ineffective Klonopin Xanax Doxepin Trazodone- stopped working [employer] Lyrica- hypomania Gabapentin- sleep disturbance Amitriptyline-sleeplessness, ineffective.  Review of Systems:  Review of Systems  Musculoskeletal: Negative for gait problem.  Skin:       Recent cold sores.  Neurological: Negative for tremors.  Psychiatric/Behavioral:       Please refer to HPI    Medications: I have reviewed the  patient's current medications.  Current Outpatient Medications  Medication Sig Dispense Refill  . BLACK ELDERBERRY PO Take by mouth.    . ECHINACEA PO Take by mouth.    . Lemborexant (DAYVIGO) 10 MG TABS Take 10 mg by mouth at bedtime. 30 tablet 0  . pseudoephedrine-guaifenesin (MUCINEX D) 60-600 MG 12 hr tablet Take 1 tablet by mouth every 12 (twelve) hours.    Marland Kitchen. acetaminophen (TYLENOL) 500 MG tablet Take 1,000 mg by mouth every 8 (eight) hours as needed.     . ALPRAZolam (XANAX) 0.5 MG tablet TAKE 1 TAB PO QHS AND 1/2-1 TABLET BY MOUTH TWICE DAILY AS NEEDED FOR ANXIETY 75 tablet 2  . amitriptyline (ELAVIL) 10 MG tablet Take 1 tablet (10 mg total) by mouth at bedtime. 30 tablet 0  . apixaban (ELIQUIS) 5 MG TABS tablet Take 1 tablet (5 mg total) by mouth 2 (two) times daily. 180 tablet 1  . b complex vitamins capsule Take 1 capsule by mouth daily.    . busPIRone (BUSPAR) 30 MG tablet Take 1 tablet (30 mg total) by mouth 2 (two) times daily. 180 tablet 1  . CALCIUM PO Take by mouth.    . Cholecalciferol (VITAMIN D-3) 1000 units CAPS Take 10,000 Units by mouth daily.     Marland Kitchen. ELIQUIS 5 MG TABS tablet TAKE 1 TABLET (5 MG TOTAL) BY MOUTH 2 (TWO) TIMES DAILY WITH A MEAL. 60 tablet 0  . estradiol (ESTRACE) 0.1 MG/GM vaginal cream Place 1 Applicatorful vaginally at bedtime.     Marland Kitchen. L-Methylfolate-Algae (DEPLIN 15) 15-90.314 MG CAPS TAKE 1 CAPSULE BY MOUTH EVERY DAY 90 capsule 2  . lisinopril (PRINIVIL,ZESTRIL) 10 MG tablet TAKE 1 TABLET BY MOUTH EVERY DAY 34 tablet 2  . methocarbamol (ROBAXIN) 750 MG tablet TAKE 1 TABLET(750 MG) BY MOUTH THREE TIMES DAILY 90 tablet 3  . mometasone (NASONEX) 50 MCG/ACT nasal spray USE ONE SPRAY IN EACH NOSTRIL ONE TIME DAILY (Patient taking differently: daily as needed. ) 17 g 5  . montelukast (SINGULAIR) 10 MG tablet Take 10 mg by mouth at bedtime.    . Multiple Vitamins-Minerals (ZINC PO) Take by mouth.    . Omega-3 Fatty Acids (OMEGA 3 PO) Take by mouth.    .  Probiotic Product (PROBIOTIC PO) Take 1 capsule by mouth daily.     . propranolol (INDERAL) 10 MG tablet Take 1-2 tabs po BID prn anxiety 120 tablet 1  . QUERCETIN PO Take by mouth.    . sertraline (ZOLOFT) 100 MG tablet TAKE 2 TABLETS(200 MG) BY MOUTH AT BEDTIME 180 tablet 1  . sucralfate (CARAFATE) 1 g tablet Take 1 g by mouth 2 (two) times daily as needed.     . traMADol (ULTRAM) 50 MG tablet TAKE 1 TABLET(50 MG) BY MOUTH THREE TIMES DAILY 90 tablet 3  . traZODone (DESYREL) 100 MG tablet Take 1/2-1 tablet po QHS prn insomnia 90 tablet 1  . VITAMIN A PO Take by mouth.    . vitamin C (ASCORBIC ACID) 500 MG tablet Take 1,000 mg by mouth daily.      No current facility-administered medications for this visit.    Medication Side Effects: None  Allergies:  Allergies  Allergen Reactions  . Citalopram Tinitus  .  Escitalopram Tinitus  . Bee Venom Swelling  . Latex Swelling  . Penicillins Swelling and Rash    Has patient had a PCN reaction causing immediate rash, facial/tongue/throat swelling, SOB or lightheadedness with hypotension: Yes Has patient had a PCN reaction causing severe rash involving mucus membranes or skin necrosis: No Has patient had a PCN reaction that required hospitalization: No Has patient had a PCN reaction occurring within the last 10 years: No If all of the above answers are "NO", then may proceed with Cephalosporin use.  Marland Kitchen Cymbalta [Duloxetine Hcl]     -memory loss  . Escitalopram Oxalate Tinitus  . Other     Yeast creams- causes secondary infections, itching, burning, redness   . Progesterone     Pt feels caused weakness, fatigue, cognitive deficits  . Trintellix [Vortioxetine]     Memory problems  . Bupropion Other (See Comments)    weakness and malaise.  . Duloxetine Other (See Comments)    Past Medical History:  Diagnosis Date  . Allergy   . Depression   . DVT (deep venous thrombosis) (HCC)   . Fibromyalgia   . Genetic defect   . GERD  (gastroesophageal reflux disease)   . History of IBS   . 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 Hypertension   . Osteoporosis   . PE (pulmonary thromboembolism) (HCC)   . Positive TB test   . Raynaud disease   . Scoliosis   . Urine incontinence     Family History  Problem Relation Age of Onset  . Arthritis Mother   . Hypertension Mother   . Mental illness Mother   . Anxiety disorder Mother   . Depression Mother   . Arthritis Father   . Hypertension Father   . Mental illness Father   . Anxiety disorder Father   . Depression Father   . Mental illness Brother   . ADD / ADHD Brother   . Psychosis Brother   . Post-traumatic stress disorder Brother   . Arthritis Daughter   . Autism Daughter   . Cancer Maternal Aunt   . Autism Maternal Aunt   . Hypertension Maternal Grandmother   . Arthritis Maternal Grandfather   . Depression Cousin   . Anxiety disorder Cousin   . Depression Maternal Aunt     Social History   Socioeconomic History  . Marital status: Divorced    Spouse name: Not on file  . Number of children: Not on file  . Years of education: Not on file  . Highest education level: Not on file  Occupational History  . Not on file  Tobacco Use  . Smoking status: Never Smoker  . Smokeless tobacco: Never Used  Vaping Use  . Vaping Use: Never used  Substance and Sexual Activity  . Alcohol use: Yes    Alcohol/week: 0.0 standard drinks    Comment: occasional   . Drug use: No  . Sexual activity: Not on file  Other Topics Concern  . 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 Difficulty of Paying Living Expenses: Not on file  Food Insecurity:   . Worried About Programme researcher, broadcasting/film/video in the Last Year: Not on file  . Ran Out of Food in the Last Year: Not on file  Transportation Needs:   . Lack of  Transportation (Medical): Not on file  . Lack of Transportation (Non-Medical): Not on file  Physical Activity:   . Days of Exercise per Week: Not on file  . Minutes of Exercise per Session: Not on file  Stress:   . Feeling of Stress : Not on file  Social Connections:   . Frequency of Communication with Friends and Family: Not on file  . Frequency of Social Gatherings with Friends and Family: Not on file  . Attends Religious Services: Not on file  . Active Member of Clubs or Organizations: Not on file  . Attends Banker Meetings: Not on file  . Marital Status: Not on file  Intimate Partner Violence:   . Fear of Current or Ex-Partner: Not on file  . Emotionally Abused: Not on file  . Physically Abused: Not on file  . Sexually Abused: Not on file    Past Medical History, Surgical history, Social history, and Family history were reviewed and updated as appropriate.   Please see review of systems for further details on the patient's review from today.   Objective:   Physical Exam:  There were no vitals taken for this visit.  Physical Exam Neurological:     Mental Status: She is alert and oriented to person, place, and time.     Cranial Nerves: No dysarthria.  Psychiatric:        Attention and Perception: Attention and perception normal.        Mood and Affect: Mood is anxious. Affect is tearful.        Speech: Speech normal.        Behavior: Behavior is cooperative.        Thought Content: Thought content normal. Thought content is not paranoid or delusional. Thought content does not include homicidal or suicidal ideation. Thought content does not include homicidal or suicidal plan.        Cognition and Memory: Cognition and memory normal.        Judgment: Judgment normal.     Comments: Insight intact     Lab Review:     Component Value Date/Time   NA 137 09/06/2017 0750   NA 134 (L) 01/05/2017 1359   K 4.5 09/06/2017 0750   K 3.9 01/05/2017 1359   CL 100  09/06/2017 0750   CO2 29 09/06/2017 0750   CO2 25 01/05/2017 1359   GLUCOSE 81 09/06/2017 0750   GLUCOSE 106 01/05/2017 1359   BUN 17 09/06/2017 0750   BUN 10.2 01/05/2017 1359   CREATININE 0.85 09/06/2017 0750   CREATININE 0.8 01/05/2017 1359   CALCIUM 9.7 09/06/2017 0750   CALCIUM 9.5 01/05/2017 1359   PROT 6.4 01/05/2017 1359   ALBUMIN 4.2 01/05/2017 1359   AST 18 01/05/2017 1359   ALT 15 01/05/2017 1359   ALKPHOS 60 01/05/2017 1359   BILITOT 0.22 01/05/2017 1359   GFRNONAA >60 12/04/2016 1548   GFRAA >60 12/04/2016 1548       Component Value Date/Time   WBC 5.2 09/06/2017 0750   RBC 4.29 09/06/2017 0750   HGB 13.9 09/06/2017 0750   HGB 13.4 01/05/2017 1359   HCT 41.1 09/06/2017 0750   HCT 39.0 01/05/2017 1359   PLT 287.0 09/06/2017 0750   PLT 274 01/05/2017 1359   MCV 95.9 09/06/2017 0750   MCV 97.5 01/05/2017 1359   MCH 33.5 01/05/2017 1359   MCH 33.4 12/04/2016 1548   MCHC 33.8 09/06/2017 0750   RDW 13.4 09/06/2017 0750   RDW  12.4 01/05/2017 1359   LYMPHSABS 3.0 01/05/2017 1359   MONOABS 0.6 01/05/2017 1359   EOSABS 0.1 01/05/2017 1359   BASOSABS 0.0 01/05/2017 1359    No results found for: POCLITH, LITHIUM   No results found for: PHENYTOIN, PHENOBARB, VALPROATE, CBMZ   .res Assessment: Plan:   Discussed trying Mucinex without Pseudoephedrine since pseudoephedrine can exacerbate anxiety.  Discussed potential benefits, risks, and side effects of Propranolol. Discussed that Propranolol may be helpful for physical s/s of anxiety that occur while she is working since risk of somnolence and impaired concentration is less compared to Xanax.  Will continue all other medications as prescribed.  Recommend continuing psychotherapy.  Pt to f/u in 4 weeks or sooner if clinically indicated. Patient advised to contact office with any questions, adverse effects, or acute worsening in signs and symptoms.   Carneshia was seen today for panic attack.  Diagnoses and all  orders for this visit:  Generalized anxiety disorder -     propranolol (INDERAL) 10 MG tablet; Take 1-2 tabs po BID prn anxiety  Panic disorder -     propranolol (INDERAL) 10 MG tablet; Take 1-2 tabs po BID prn anxiety     Please see After Visit Summary for patient specific instructions.  Future Appointments  Date Time Provider Department Center  02/26/2020 11:20 AM Raulkar, Drema Pry, MD [facility]  02/27/2020 10:30 AM Kirsteins, Victorino Sparrow, MD [facility]    No orders of the defined types were placed in this encounter.     -------------------------------

## 2020-01-27 ENCOUNTER — Other Ambulatory Visit: Payer: Self-pay

## 2020-01-27 ENCOUNTER — Encounter
Payer: PRIVATE HEALTH INSURANCE | Attending: Physical Medicine and Rehabilitation | Admitting: Physical Medicine and Rehabilitation

## 2020-01-27 ENCOUNTER — Encounter: Payer: Self-pay | Admitting: Physical Medicine and Rehabilitation

## 2020-01-27 VITALS — BP 123/82 | HR 73 | Temp 98.7°F | Ht 65.0 in | Wt 155.0 lb

## 2020-01-27 DIAGNOSIS — M67952 Unspecified disorder of synovium and tendon, left thigh: Secondary | ICD-10-CM | POA: Insufficient documentation

## 2020-01-27 DIAGNOSIS — M5442 Lumbago with sciatica, left side: Secondary | ICD-10-CM | POA: Diagnosis present

## 2020-01-27 DIAGNOSIS — M47817 Spondylosis without myelopathy or radiculopathy, lumbosacral region: Secondary | ICD-10-CM

## 2020-01-27 DIAGNOSIS — M4807 Spinal stenosis, lumbosacral region: Secondary | ICD-10-CM | POA: Insufficient documentation

## 2020-01-27 DIAGNOSIS — G8929 Other chronic pain: Secondary | ICD-10-CM | POA: Diagnosis present

## 2020-01-27 DIAGNOSIS — Z79891 Long term (current) use of opiate analgesic: Secondary | ICD-10-CM | POA: Insufficient documentation

## 2020-01-27 DIAGNOSIS — M797 Fibromyalgia: Secondary | ICD-10-CM | POA: Diagnosis present

## 2020-01-27 DIAGNOSIS — G894 Chronic pain syndrome: Secondary | ICD-10-CM | POA: Diagnosis present

## 2020-01-27 DIAGNOSIS — Z5181 Encounter for therapeutic drug level monitoring: Secondary | ICD-10-CM | POA: Diagnosis present

## 2020-01-27 DIAGNOSIS — M47816 Spondylosis without myelopathy or radiculopathy, lumbar region: Secondary | ICD-10-CM | POA: Insufficient documentation

## 2020-01-27 DIAGNOSIS — M816 Localized osteoporosis [Lequesne]: Secondary | ICD-10-CM | POA: Insufficient documentation

## 2020-01-27 NOTE — Progress Notes (Signed)
Subjective:    Patient ID: Faith Thomas, female    DOB: 28-Apr-1961, 59 y.o.   MRN: [id number]  HPI  Faith Thomas is a 59 year old woman who presents for follow up of her fibromyalgia, facet arthropathy, and lumbar spinal stenosis.   She received her radiofrequency nerve ablation from Dr. Wynn Banker on 7/22 of her left L5 dorsal ramus and left L3 and L4 medial branches. She had good benefit from this. We had discussed that this would help her back pain but not her leg pain and now she would be interested in addressing her leg pain.   She is interested in scheduling ESI. She is on Eliquis (prescribed by her PCP, she does not see a cardiologist). She will stop three days prior to procedure. She will have driver day of procedure.  She also has a painful thoracic paraspinal spasm on left side. She would be interested in trigger points for this. This radiates down to her lumbar spine and she is not sure if it is connected to her lower back pain.   She has a There Cane and uses a soft foam roller and rolls on that regularly. She has a left sided thoracic parasponal muscle spasm. Ice helps decrease the pain.  She would like to be able to have less pain in order to do more activities with her 65 year old granddaughter.    She is doing Noom, a weight loss app based on psychology. Being aware of emoitional eating, lost a little bit of weight. Her goal is to get down to 135. Height is 5'5."   She has been using ginger a fair amount. It caused a lot of gastric issues.   Pain Inventory Average Pain 4 Pain Right Now 5 My pain is sharp, burning, dull, stabbing, tingling and aching  In the last 24 hours, has pain interfered with the following? General activity 4 Relation with others 3 Enjoyment of life 4 What TIME of day is your pain at its worst? varies Sleep (in general) Fair  Pain is worse with: walking, bending, inactivity and standing Pain improves with: rest, heat/ice,  therapy/exercise, medication and injections Relief from Meds: 6  Family History  Problem Relation Age of Onset  . Arthritis Mother   . Hypertension Mother   . Mental illness Mother   . Anxiety disorder Mother   . Depression Mother   . Arthritis Father   . Hypertension Father   . Mental illness Father   . Anxiety disorder Father   . Depression Father   . Mental illness Brother   . ADD / ADHD Brother   . Psychosis Brother   . Post-traumatic stress disorder Brother   . Arthritis Daughter   . Autism Daughter   . Cancer Maternal Aunt   . Autism Maternal Aunt   . Hypertension Maternal Grandmother   . Arthritis Maternal Grandfather   . Depression Cousin   . Anxiety disorder Cousin   . Depression Maternal Aunt    Social History   Socioeconomic History  . Marital status: Divorced    Spouse name: Not on file  . Number of children: Not on file  . Years of education: Not on file  . Highest education level: Not on file  Occupational History  . Not on file  Tobacco Use  . Smoking status: Never Smoker  . Smokeless tobacco: Never Used  Vaping Use  . Vaping Use: Never used  Substance and Sexual Activity  . Alcohol use:  Yes    Alcohol/week: 0.0 standard drinks    Comment: occasional   . Drug use: No  . Sexual activity: Not on file  Other Topics Concern  . 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 Difficulty of Paying Living Expenses: Not on file  Food Insecurity:   . Worried About Programme researcher, broadcasting/film/video in the Last Year: Not on file  . Ran Out of Food in the Last Year: Not on file  Transportation Needs:   . Lack of Transportation (Medical): Not on file  . Lack of Transportation (Non-Medical): Not on file  Physical Activity:   . Days of Exercise per Week: Not on file  . Minutes of Exercise per Session: Not on file  Stress:    . Feeling of Stress : Not on file  Social Connections:   . Frequency of Communication with Friends and Family: Not on file  . Frequency of Social Gatherings with Friends and Family: Not on file  . Attends Religious Services: Not on file  . Active Member of Clubs or Organizations: Not on file  . Attends Banker Meetings: Not on file  . Marital Status: Not on file   History reviewed. No pertinent surgical history. History reviewed. No pertinent surgical history. Past Medical History:  Diagnosis Date  . Allergy   . Depression   . DVT (deep venous thrombosis) (HCC)   . Fibromyalgia   . Genetic defect   . GERD (gastroesophageal reflux disease)   . History of IBS   . 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 Hypertension   . Osteoporosis   . PE (pulmonary thromboembolism) (HCC)   . Positive TB test   . Raynaud disease   . Scoliosis   . Urine incontinence    BP 123/82   Pulse 73   Temp 98.7 F (37.1 C)   Ht 5\' 5"  (1.651 m)   Wt 155 lb (70.3 kg)   SpO2 98%   BMI 25.79 kg/m   Opioid Risk Score:   Fall Risk Score:  `1  Depression screen PHQ 2/9  Depression screen [facility] 2/9 04/15/2019 01/07/2018 09/06/2017  Decreased Interest 0 3 0  Down, Depressed, Hopeless 0 3 2  PHQ - 2 Score 0 6 2    Review of Systems  Constitutional: Negative.   HENT: Negative.   Eyes: Negative.   Respiratory: Negative.   Cardiovascular: Negative.   Gastrointestinal: Negative.   Endocrine: Negative.   Genitourinary: Negative.   Musculoskeletal: Negative.   Skin: Negative.   Allergic/Immunologic: Negative.   Neurological: Negative.   Hematological: Negative.   Psychiatric/Behavioral: Negative.   All other systems reviewed and are negative.      Objective:   Physical Exam  Gen: no distress, normal appearing HEENT: oral mucosa pink and moist, NCAT Cardio: Reg rate Chest: normal effort, normal rate of breathing Abd: soft, non-distended Ext: no edema Skin:  intact Neuro: Alert and oriented x3 Musculoskeletal: + slump test on the left for pain radiating down leg in L5 and S1 nerve root distribution. Full and painless flexion and extension. Left sided thoracic paraspinal trigger points.  Psych: pleasant, normal affect    Assessment & Plan:  1) Lumbar spondylosis with neurogenic claudication, left side 2) Bilateral facet joint arthritis, lumbar 3) left sided thoracic paraspinal spasm  -Mrs.  Thomas's chief complaint today is lower back pain with left sided sciatica in the L5 and S1 distribution. On physical exam, she has painless FROM on flexion with her bilateral low back pain. On last visit this was reproduced with extension but currently she has full and painless extension. Her pain has been most consistent withleft L5 and S1 nerve root impingementsecondary to stenosisand lower back pain secondary to facet arthropathy, which isseen onher most recent MRI, which shows worsening facet arthropathy as well as left sided annular fissure; results reviewed and discussed with patient previously.She would benefit and is agreeable to a course of physical therapy focused on core strengthening. She would like to hold off on starting this until COVID-19 cases have reduced. For now, she will continue her daily yoga practice (she performed 2 hours today and does so regularly), which has been shown to help low back pain and which helps her.   -She is interested in left thoracic paraspinal trigger point injections- will schedule at next available appointment with me.   -She had good benefit from radiofrequency nerve ablation with Dr. Wynn Banker of left L5 dorsal ramus and left L3 and L4 medial branches. We educated her that this would not help with her leg pain and now she would like to address this. Scheduled for ESI with Dr. Wynn Banker of left L5 and S1. She will hold her Eliquis for three days prior. She will have driver.   -Will refill Robaxin to 750mg  TID  and Tramadol 50mg  TID PRN which are working for her and which she is tolerating without side effects, on an as needed basis.   4) Fibromyalgia -Patient's symptoms have greatly improved with her change in job, allowing her to sleep better. Continue daily yoga and meditation for stress relief. Discussed the goal of increasing her aerobic exercise to 15 minutes of daily outdoor walking for now.  5) Osteopenia: Counseled regarding supplements, diet, and exercise to improve bone density. She is also following with rheumatology and on Fosfomax, as well as with a dietician. She has been following diet plan well.Advised regarding risk of corticosteroids when osteopenic.   6) Hypotension: BP better controlled today at 123/82. Advised that she log her BP daily and bring reads to follow-up appointment with me or PCP so medication can be appropriately adjusted.   7) General health: provided dietary counseling.  --Follow up in 1 months for continued management of above conditions.   All questions were encouraged and answered.40 minutes spent in discussing response to radiofrequency ablation, discussion of her claudication symptoms, review of her BP, dietary counseling, discussion of her current exercise, discussion of holding eliquis prior to injection and having a driver, discussion of thoracic trigger point injection and use of TheraCane, discussion of her goal to have more mobility to play with her granddaughter

## 2020-02-20 ENCOUNTER — Other Ambulatory Visit: Payer: Self-pay

## 2020-02-20 MED ORDER — DAYVIGO 10 MG PO TABS
10.0000 mg | ORAL_TABLET | Freq: Every day | ORAL | 0 refills | Status: DC
Start: 2020-02-20 — End: 2020-03-01

## 2020-02-26 ENCOUNTER — Other Ambulatory Visit: Payer: Self-pay

## 2020-02-26 ENCOUNTER — Encounter: Payer: Self-pay | Admitting: Physical Medicine and Rehabilitation

## 2020-02-26 ENCOUNTER — Encounter
Payer: PRIVATE HEALTH INSURANCE | Attending: Physical Medicine and Rehabilitation | Admitting: Physical Medicine and Rehabilitation

## 2020-02-26 VITALS — BP 123/87 | HR 79 | Temp 97.9°F | Ht 65.0 in | Wt 154.2 lb

## 2020-02-26 DIAGNOSIS — Z79891 Long term (current) use of opiate analgesic: Secondary | ICD-10-CM | POA: Diagnosis present

## 2020-02-26 DIAGNOSIS — M797 Fibromyalgia: Secondary | ICD-10-CM | POA: Diagnosis present

## 2020-02-26 DIAGNOSIS — M5442 Lumbago with sciatica, left side: Secondary | ICD-10-CM | POA: Insufficient documentation

## 2020-02-26 DIAGNOSIS — G894 Chronic pain syndrome: Secondary | ICD-10-CM | POA: Insufficient documentation

## 2020-02-26 DIAGNOSIS — M4807 Spinal stenosis, lumbosacral region: Secondary | ICD-10-CM | POA: Diagnosis present

## 2020-02-26 DIAGNOSIS — F5101 Primary insomnia: Secondary | ICD-10-CM | POA: Diagnosis not present

## 2020-02-26 DIAGNOSIS — M67952 Unspecified disorder of synovium and tendon, left thigh: Secondary | ICD-10-CM | POA: Insufficient documentation

## 2020-02-26 DIAGNOSIS — M7918 Myalgia, other site: Secondary | ICD-10-CM | POA: Diagnosis not present

## 2020-02-26 DIAGNOSIS — G8929 Other chronic pain: Secondary | ICD-10-CM | POA: Diagnosis present

## 2020-02-26 DIAGNOSIS — Z5181 Encounter for therapeutic drug level monitoring: Secondary | ICD-10-CM | POA: Diagnosis present

## 2020-02-26 DIAGNOSIS — M816 Localized osteoporosis [Lequesne]: Secondary | ICD-10-CM | POA: Diagnosis present

## 2020-02-26 DIAGNOSIS — M47816 Spondylosis without myelopathy or radiculopathy, lumbar region: Secondary | ICD-10-CM | POA: Diagnosis present

## 2020-02-26 NOTE — Progress Notes (Signed)
Trigger Point Injection  Indication: Cervical myofascial pain not relieved by medication management and other conservative care.  Informed consent was obtained after describing risk and benefits of the procedure with the patient, this includes bleeding, bruising, infection and medication side effects.  The patient wishes to proceed and has given written consent.  The patient was placed in a seated position.  The cervical area was marked and prepped with Betadine.  It was entered with a 25-gauge 1/2 inch needle and a total of 5 mL of 1% lidocaine and 0.5% marcaine was injected into a total of 6 trigger points, after negative draw back for blood.  The patient tolerated the procedure well.  Post procedure instructions were given.

## 2020-02-27 ENCOUNTER — Encounter: Payer: Self-pay | Admitting: Physical Medicine & Rehabilitation

## 2020-02-27 ENCOUNTER — Encounter (HOSPITAL_BASED_OUTPATIENT_CLINIC_OR_DEPARTMENT_OTHER): Payer: PRIVATE HEALTH INSURANCE | Admitting: Physical Medicine & Rehabilitation

## 2020-02-27 VITALS — BP 107/74 | HR 89 | Temp 98.4°F | Ht 65.0 in | Wt 153.6 lb

## 2020-02-27 DIAGNOSIS — F5101 Primary insomnia: Secondary | ICD-10-CM | POA: Diagnosis not present

## 2020-02-27 DIAGNOSIS — M5416 Radiculopathy, lumbar region: Secondary | ICD-10-CM | POA: Diagnosis not present

## 2020-02-27 LAB — MAGNESIUM: Magnesium: 2.2 mg/dL (ref 1.6–2.3)

## 2020-02-27 NOTE — Progress Notes (Signed)
Left L5 and Left S1  transforaminal epidural steroid injection under fluoroscopic guidance with contrast enhancement  Indication: Lumbosacral radiculitis is not relieved by medication management or other conservative care and interfering with self-care and mobility.   Pt off Eliquis for 3 days  Informed consent was obtained after describing risk and benefits of the procedure with the patient, this includes bleeding, bruising, infection, paralysis and medication side effects.  The patient wishes to proceed and has given written consent.  Patient was placed in prone position.  The lumbar area was marked and prepped with Betadine.  It was entered with a 25-gauge 1-1/2 inch needle and one mL of 1% lidocaine was injected into the skin and subcutaneous tissue.  Then a 22-gauge 5in spinal needle was inserted into the Left L5-S1 intervertebral foramen under AP, lateral, and oblique view.  Once needle tip was within the foramen on lateral views an dnor exceeding 6 o clock position on th epedical on AP viewed Isovue 200 was inected x 44ml Then a solution containing one mL of 10 mg per mL dexamethasone and 2 mL of 1% lidocaine was injected. THis same procedure was repeated at the S1 level using 22g 3.5" needle targeting the Left S1 foramen.  AP, Lateral views confirmed proper needle tip location and contrast enhancement under live fluoro demonstrated good epidural spread.   The patient tolerated procedure well.  Post procedure instructions were given.  Please see post procedure form.  Subsequent ESI could be performed with 22g 3.5 in spinal needle  May resume Eliquis in am

## 2020-02-27 NOTE — Progress Notes (Signed)
  PROCEDURE RECORD [facility]   Name: Faith Thomas DOB:06-Dec-1960 MRN: [id number]  Date:02/27/2020  Physician: Claudette Laws, MD    Nurse/CMA: Charise Carwin  Allergies:  Allergies  Allergen Reactions  . Citalopram Tinitus  . Escitalopram Tinitus  . Bee Venom Swelling  . Latex Swelling  . Penicillins Swelling and Rash    Has patient had a PCN reaction causing immediate rash, facial/tongue/throat swelling, SOB or lightheadedness with hypotension: Yes Has patient had a PCN reaction causing severe rash involving mucus membranes or skin necrosis: No Has patient had a PCN reaction that required hospitalization: No Has patient had a PCN reaction occurring within the last 10 years: No If all of the above answers are "NO", then may proceed with Cephalosporin use.  Marland Kitchen Cymbalta [Duloxetine Hcl]     -memory loss  . Escitalopram Oxalate Tinitus  . Other     Yeast creams- causes secondary infections, itching, burning, redness   . Progesterone     Pt feels caused weakness, fatigue, cognitive deficits  . Trintellix [Vortioxetine]     Memory problems  . Bupropion Other (See Comments)    weakness and malaise.  . Duloxetine Other (See Comments)  . Gabapentin Other (See Comments)    Could not sleep.      Consent Signed: Yes.    Is patient diabetic? No.  CBG today? N//A  Pregnant: No. LMP: No LMP recorded. Patient is postmenopausal. (age 61-55)  Anticoagulants: Eliquis on hold for 3 days Anti-inflammatory: no Antibiotics: no  Procedure: Left ESI L5 , S1 Position: Prone Start Time: 11:05 End Time 11:16 AM Fluoro Time: 36  RN/CMA Kirsti Mcalpine MA Lacey Dotson MA    Time 10:38 PM 11:23 AM    BP 107/74 127/80    Pulse  89 86    Respirations 12 14    O2 Sat 95 95    S/S 6 6    Pain Level 6/10 3/10     D/C home with Mardene Sayer (daughter), patient A & O X 3, D/C instructions reviewed, and sits independently.

## 2020-02-27 NOTE — Patient Instructions (Addendum)
You received an epidural steroid injection under fluoroscopic guidance. This is the most accurate way to perform an epidural injection. This injection was performed to relieve thigh or leg or foot pain that may be related to a pinched nerve in the lumbar spine. The local anesthetic injected today may cause numbness in your leg for a couple hours. If it is severe we may need to observe you for 30-60 minutes after the injection. The cortisone medicine injected today may take several days to take full effect. This medicine can also cause facial flushing or feeling of being warm.  This injection may last for days weeks or months. It can be repeated if needed. If it is not effective, another spinal level may need to be injected. Other treatments include medication management as well as physical therapy. In some cases surgery may be an option.  Resume Eliquis tomorrow

## 2020-03-01 ENCOUNTER — Telehealth: Payer: Self-pay | Admitting: Psychiatry

## 2020-03-01 ENCOUNTER — Other Ambulatory Visit: Payer: Self-pay

## 2020-03-01 DIAGNOSIS — F3340 Major depressive disorder, recurrent, in remission, unspecified: Secondary | ICD-10-CM

## 2020-03-01 DIAGNOSIS — F5101 Primary insomnia: Secondary | ICD-10-CM

## 2020-03-01 MED ORDER — MIRTAZAPINE 15 MG PO TABS: ORAL_TABLET | ORAL | 0 refills | Status: DC

## 2020-03-01 MED ORDER — DAYVIGO 10 MG PO TABS
10.0000 mg | ORAL_TABLET | Freq: Every day | ORAL | 0 refills | Status: DC
Start: 2020-03-01 — End: 2020-04-16

## 2020-03-01 NOTE — Telephone Encounter (Signed)
Refill request for [facility] received on 09/25 Saturday. Pended for Shanda Bumps to send.  Do not see Mirtazapine on her med profile?

## 2020-03-01 NOTE — Telephone Encounter (Signed)
Pt called and said that [facility] has sent a request twice on these refill med's but they haven't heard anything. Pt request a refill on Dayvigo and Martizipane called to [facility] on [address]. Left message for her to make a follow-up appointment.

## 2020-03-01 NOTE — Addendum Note (Signed)
Addended by: Derenda Mis on: 03/01/2020 12:51 PM   Modules accepted: Orders

## 2020-03-17 ENCOUNTER — Other Ambulatory Visit: Payer: Self-pay | Admitting: Psychiatry

## 2020-03-17 DIAGNOSIS — Z1589 Genetic susceptibility to other disease: Secondary | ICD-10-CM

## 2020-03-17 DIAGNOSIS — F3340 Major depressive disorder, recurrent, in remission, unspecified: Secondary | ICD-10-CM

## 2020-03-17 NOTE — Telephone Encounter (Signed)
Please review

## 2020-03-17 NOTE — Telephone Encounter (Signed)
Please call patient to make an appointment.

## 2020-03-29 ENCOUNTER — Encounter
Payer: PRIVATE HEALTH INSURANCE | Attending: Physical Medicine and Rehabilitation | Admitting: Physical Medicine and Rehabilitation

## 2020-03-29 ENCOUNTER — Other Ambulatory Visit: Payer: Self-pay

## 2020-03-29 VITALS — BP 118/74 | HR 81 | Temp 98.4°F | Ht 65.0 in | Wt 153.0 lb

## 2020-03-29 DIAGNOSIS — M7918 Myalgia, other site: Secondary | ICD-10-CM | POA: Diagnosis not present

## 2020-03-30 ENCOUNTER — Encounter: Payer: Self-pay | Admitting: Physical Medicine and Rehabilitation

## 2020-03-30 NOTE — Progress Notes (Signed)
Trigger Point Injection  Indication: Thoracic myofascial pain not relieved by medication management and other conservative care.  Informed consent was obtained after describing risk and benefits of the procedure with the patient, this includes bleeding, bruising, infection and medication side effects.  The patient wishes to proceed and has given written consent.  The patient was placed in a seated position.  The cervical area was marked and prepped with Betadine.  It was entered with a 25-gauge 1/2 inch needle and a total of 5 mL of 1% lidocaine and normal saline was injected into a total of 6 trigger points, after negative draw back for blood.  The patient tolerated the procedure well.  Post procedure instructions were given.

## 2020-04-15 ENCOUNTER — Other Ambulatory Visit: Payer: Self-pay | Admitting: Physical Medicine and Rehabilitation

## 2020-04-15 NOTE — Telephone Encounter (Signed)
Refill request for Tramadol 

## 2020-04-16 ENCOUNTER — Other Ambulatory Visit: Payer: Self-pay

## 2020-04-16 MED ORDER — DAYVIGO 10 MG PO TABS
10.0000 mg | ORAL_TABLET | Freq: Every day | ORAL | 0 refills | Status: DC
Start: 2020-04-16 — End: 2020-05-03

## 2020-04-22 ENCOUNTER — Telehealth: Payer: Self-pay | Admitting: Psychiatry

## 2020-04-22 ENCOUNTER — Other Ambulatory Visit: Payer: Self-pay

## 2020-04-22 DIAGNOSIS — F411 Generalized anxiety disorder: Secondary | ICD-10-CM

## 2020-04-22 MED ORDER — ALPRAZOLAM 0.5 MG PO TABS: ORAL_TABLET | ORAL | 1 refills | Status: DC

## 2020-04-22 NOTE — Telephone Encounter (Signed)
Last refill 03/19/20 Pended for Shanda Bumps to send

## 2020-04-22 NOTE — Telephone Encounter (Signed)
Pt is requesting a RF of Xanax thru her pharmacy. [facility] on file.

## 2020-05-03 ENCOUNTER — Encounter: Payer: Self-pay | Admitting: Psychiatry

## 2020-05-03 ENCOUNTER — Telehealth (INDEPENDENT_AMBULATORY_CARE_PROVIDER_SITE_OTHER): Payer: PRIVATE HEALTH INSURANCE | Admitting: Psychiatry

## 2020-05-03 DIAGNOSIS — F411 Generalized anxiety disorder: Secondary | ICD-10-CM | POA: Diagnosis not present

## 2020-05-03 DIAGNOSIS — F5101 Primary insomnia: Secondary | ICD-10-CM

## 2020-05-03 DIAGNOSIS — F3340 Major depressive disorder, recurrent, in remission, unspecified: Secondary | ICD-10-CM

## 2020-05-03 MED ORDER — MIRTAZAPINE 15 MG PO TABS: ORAL_TABLET | ORAL | 1 refills | Status: DC

## 2020-05-03 MED ORDER — BUSPIRONE HCL 30 MG PO TABS: 30.0000 mg | ORAL_TABLET | Freq: Two times a day (BID) | ORAL | 1 refills | Status: DC

## 2020-05-03 MED ORDER — SERTRALINE HCL 100 MG PO TABS: ORAL_TABLET | ORAL | 1 refills | Status: DC

## 2020-05-03 MED ORDER — BELSOMRA 20 MG PO TABS: 20.0000 mg | ORAL_TABLET | Freq: Every day | ORAL | 0 refills | Status: DC

## 2020-05-03 MED ORDER — BELSOMRA 20 MG PO TABS: 20.0000 mg | ORAL_TABLET | Freq: Every day | ORAL | 2 refills | Status: DC

## 2020-05-03 NOTE — Progress Notes (Signed)
[name] [id number] 1960-09-27 59 y.o.  Virtual Visit via Video Note  I connected with pt @ on 05/03/20 at 10:00 AM EST by a video enabled telemedicine application and verified that I am speaking with the correct person using two identifiers.   I discussed the limitations of evaluation and management by telemedicine and the availability of in person appointments. The patient expressed understanding and agreed to proceed.  I discussed the assessment and treatment plan with the patient. The patient was provided an opportunity to ask questions and all were answered. The patient agreed with the plan and demonstrated an understanding of the instructions.   The patient was advised to call back or seek an in-person evaluation if the symptoms worsen or if the condition fails to improve as anticipated.  I provided 30 minutes of non-face-to-face time during this encounter.  The patient was located at home.  The provider was located at [place].   Thayer Headings, PMHNP   Subjective:   Patient ID:  Faith Thomas is a 59 y.o. (DOB 05/22/1961) female.  Chief Complaint:  Chief Complaint  Patient presents with  . Insomnia  . Follow-up    anxiety, depression    HPI Faith Thomas presents for follow-up of depression, anxiety, and insomnia. She reports that she has been doing well overall. She reports that she went off Dayvigo and is considering re-starting it. She reports increased difficulty falling and staying asleep. Sleeping about 6 hours a night on average. She reports that her sleep is "not as deep a sleep as I was getting."   She reports that she has had some anxiety in response to daughter getting married and buying a house and with the upcoming holidays. She reports that she typically has increased anxiety around the holidays. She reports that she has had less anxiety compared to last visit. Notices some mild depression "with the holidays coming up but  less than the previous years." Reports that her energy and motivation have improved and is doing some art work and thinking about more creative outlets. Met with financial planner and has set goals and made a financial plan. Concentration is adequate. Notices some occasional "bored and emotional eating." Reports that she has not gained back wt that she has lost. Denies SI.   Has not tried Belsomra.   Helped daughter with her wedding and enjoyed this. Enjoying granddaughter. Work has been going ok and continues to work from home.   Taking Xanax 0.5 mg 1-2 tabs po QHS to fall asleep.   Past Medication Trials: Buspar-Effective Trintellix- memory difficulties Sertraline- Effective Cymbalta- memory difficulties Prozac-stomach pain  Paxil Celexa Lexapro Remeron- Effective Wellbutrin XL- Irritability Lamictal- ineffective Klonopin Xanax Doxepin Trazodone- stopped working [employer] Deplin Lyrica- hypomania Gabapentin- sleep disturbance Amitriptyline-sleeplessness, ineffective.  Review of Systems:  Review of Systems  Musculoskeletal: Negative for gait problem.  Skin:       Recent cold sores  Psychiatric/Behavioral:       Please refer to HPI   Improved pain with injections.   Medications: I have reviewed the patient's current medications.  Current Outpatient Medications  Medication Sig Dispense Refill  . acetaminophen (TYLENOL) 500 MG tablet Take 1,000 mg by mouth every 8 (eight) hours as needed.     . ALPRAZolam (XANAX) 0.5 MG tablet TAKE 1 TAB PO QHS AND 1/2-1 TABLET BY MOUTH TWICE DAILY AS NEEDED FOR ANXIETY 75 tablet 1  . apixaban (ELIQUIS) 5 MG TABS tablet Take 1 tablet (5 mg total)  by mouth 2 (two) times daily. 180 tablet 1  . BLACK ELDERBERRY PO Take by mouth.    . busPIRone (BUSPAR) 30 MG tablet Take 1 tablet (30 mg total) by mouth 2 (two) times daily. 180 tablet 1  . CALCIUM PO Take by mouth.    . Cholecalciferol (VITAMIN D-3) 1000 units CAPS Take 5,000 Units by mouth  daily.     Marland Kitchen ELIQUIS 5 MG TABS tablet TAKE 1 TABLET (5 MG TOTAL) BY MOUTH 2 (TWO) TIMES DAILY WITH A MEAL. 60 tablet 0  . Estradiol 10 MCG TABS vaginal tablet Place 1 tablet vaginally 2 (two) times a week.    Marland Kitchen L-Methylfolate-Algae (DEPLIN 15) 15-90.314 MG CAPS TAKE 1 CAPSULE BY MOUTH EVERY DAY 90 capsule 0  . lisinopril (PRINIVIL,ZESTRIL) 10 MG tablet TAKE 1 TABLET BY MOUTH EVERY DAY 34 tablet 2  . Melatonin 3 MG CAPS Take by mouth.    . methocarbamol (ROBAXIN) 750 MG tablet TAKE 1 TABLET(750 MG) BY MOUTH THREE TIMES DAILY 90 tablet 3  . mirtazapine (REMERON) 15 MG tablet TAKE 1 TABLET(15 MG) BY MOUTH AT BEDTIME 90 tablet 1  . Multiple Vitamins-Minerals (ZINC PO) Take by mouth.    . Omega-3 Fatty Acids (OMEGA 3 PO) Take by mouth.    . Probiotic Product (PROBIOTIC PO) Take 1 capsule by mouth daily.     . sertraline (ZOLOFT) 100 MG tablet TAKE 2 TABLETS(200 MG) BY MOUTH AT BEDTIME 180 tablet 1  . sucralfate (CARAFATE) 1 g tablet Take 1 g by mouth 2 (two) times daily as needed.     . traMADol (ULTRAM) 50 MG tablet TAKE 1 TABLET(50 MG) BY MOUTH THREE TIMES DAILY 90 tablet 1  . tretinoin (RETIN-A) 0.025 % cream APPLY TO AFFECTED SKIN AT BEDTIME    . valACYclovir (VALTREX) 1000 MG tablet Take 1,000 mg by mouth 3 (three) times daily.     Marland Kitchen VITAMIN A PO Take by mouth.    . vitamin C (ASCORBIC ACID) 500 MG tablet Take 1,000 mg by mouth daily.     . vitamin E (VITAMIN E) 180 MG (400 UNITS) capsule Take 400 Units by mouth daily.    Marland Kitchen amitriptyline (ELAVIL) 10 MG tablet Take 1 tablet (10 mg total) by mouth at bedtime. (Patient not taking: Reported on 03/29/2020) 30 tablet 0  . fluconazole (DIFLUCAN) 150 MG tablet Take by mouth.    . Suvorexant (BELSOMRA) 20 MG TABS Take 20 mg by mouth at bedtime for 10 days. 10 tablet 0  . Suvorexant (BELSOMRA) 20 MG TABS Take 20 mg by mouth at bedtime. 30 tablet 2   No current facility-administered medications for this visit.    Medication Side Effects:  None  Allergies:  Allergies  Allergen Reactions  . Citalopram Tinitus  . Escitalopram Tinitus  . Bee Venom Swelling  . Latex Swelling  . Penicillins Swelling and Rash    Has patient had a PCN reaction causing immediate rash, facial/tongue/throat swelling, SOB or lightheadedness with hypotension: Yes Has patient had a PCN reaction causing severe rash involving mucus membranes or skin necrosis: No Has patient had a PCN reaction that required hospitalization: No Has patient had a PCN reaction occurring within the last 10 years: No If all of the above answers are "NO", then may proceed with Cephalosporin use.  Marland Kitchen Cymbalta [Duloxetine Hcl]     -memory loss  . Escitalopram Oxalate Tinitus  . Other     Yeast creams- causes secondary infections, itching, burning, redness   .  Progesterone     Pt feels caused weakness, fatigue, cognitive deficits  . Trintellix [Vortioxetine]     Memory problems  . Bupropion Other (See Comments)    weakness and malaise.  . Duloxetine Other (See Comments)  . Gabapentin Other (See Comments)    Could not sleep.      Past Medical History:  Diagnosis Date  . Allergy   . Depression   . DVT (deep venous thrombosis) ([place])   . Fibromyalgia   . Genetic defect   . GERD (gastroesophageal reflux disease)   . History of IBS   . 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 Hypertension   . Osteoporosis   . PE (pulmonary thromboembolism) ([place])   . Positive TB test   . Raynaud disease   . Scoliosis   . Urine incontinence     Family History  Problem Relation Age of Onset  . Arthritis Mother   . Hypertension Mother   . Mental illness Mother   . Anxiety disorder Mother   . Depression Mother   . Arthritis Father   . Hypertension Father   . Mental illness Father   . Anxiety disorder Father   . Depression Father   . Mental illness Brother   . ADD / ADHD Brother   . Psychosis Brother   . Post-traumatic stress disorder Brother   . Arthritis Daughter    . Autism Daughter   . Cancer Maternal Aunt   . Autism Maternal Aunt   . Hypertension Maternal Grandmother   . Arthritis Maternal Grandfather   . Depression Cousin   . Anxiety disorder Cousin   . Depression Maternal Aunt     Social History   Socioeconomic History  . Marital status: Divorced    Spouse name: Not on file  . Number of children: Not on file  . Years of education: Not on file  . Highest education level: Not on file  Occupational History  . Not on file  Tobacco Use  . Smoking status: Never Smoker  . Smokeless tobacco: Never Used  Vaping Use  . Vaping Use: Never used  Substance and Sexual Activity  . Alcohol use: Yes    Alcohol/week: 0.0 standard drinks    Comment: occasional   . Drug use: No  . Sexual activity: Not on file  Other Topics Concern  . 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 Difficulty of Paying Living Expenses: Not on file  Food Insecurity:   . Worried About Charity fundraiser in the Last Year: Not on file  . Ran Out of Food in the Last Year: Not on file  Transportation Needs:   . Lack of Transportation (Medical): Not on file  . Lack of Transportation (Non-Medical): Not on file  Physical Activity:   . Days of Exercise per Week: Not on file  . Minutes of Exercise per Session: Not on file  Stress:   . Feeling of Stress : Not on file  Social Connections:   . Frequency of Communication with Friends and Family: Not on file  . Frequency of Social Gatherings with Friends and Family: Not on file  . Attends Religious Services: Not on file  . Active Member of Clubs or Organizations: Not on file  . Attends Archivist Meetings: Not on file  . Marital  Status: Not on file  Intimate Partner Violence:   . Fear of Current or Ex-Partner: Not on file  . Emotionally Abused: Not on file  .  Physically Abused: Not on file  . Sexually Abused: Not on file    Past Medical History, Surgical history, Social history, and Family history were reviewed and updated as appropriate.   Please see review of systems for further details on the patient's review from today.   Objective:   Physical Exam:  There were no vitals taken for this visit.  Physical Exam Neurological:     Mental Status: She is alert and oriented to person, place, and time.     Cranial Nerves: No dysarthria.  Psychiatric:        Attention and Perception: Attention and perception normal.        Mood and Affect: Mood normal.        Speech: Speech normal.        Behavior: Behavior is cooperative.        Thought Content: Thought content normal. Thought content is not paranoid or delusional. Thought content does not include homicidal or suicidal ideation. Thought content does not include homicidal or suicidal plan.        Cognition and Memory: Cognition and memory normal.        Judgment: Judgment normal.     Comments: Insight intact     Lab Review:     Component Value Date/Time   NA 137 09/06/2017 0750   NA 134 (L) 01/05/2017 1359   K 4.5 09/06/2017 0750   K 3.9 01/05/2017 1359   CL 100 09/06/2017 0750   CO2 29 09/06/2017 0750   CO2 25 01/05/2017 1359   GLUCOSE 81 09/06/2017 0750   GLUCOSE 106 01/05/2017 1359   BUN 17 09/06/2017 0750   BUN 10.2 01/05/2017 1359   CREATININE 0.85 09/06/2017 0750   CREATININE 0.8 01/05/2017 1359   CALCIUM 9.7 09/06/2017 0750   CALCIUM 9.5 01/05/2017 1359   PROT 6.4 01/05/2017 1359   ALBUMIN 4.2 01/05/2017 1359   AST 18 01/05/2017 1359   ALT 15 01/05/2017 1359   ALKPHOS 60 01/05/2017 1359   BILITOT 0.22 01/05/2017 1359   GFRNONAA >60 12/04/2016 1548   GFRAA >60 12/04/2016 1548       Component Value Date/Time   WBC 5.2 09/06/2017 0750   RBC 4.29 09/06/2017 0750   HGB 13.9 09/06/2017 0750   HGB 13.4 01/05/2017 1359   HCT 41.1 09/06/2017 0750   HCT 39.0  01/05/2017 1359   PLT 287.0 09/06/2017 0750   PLT 274 01/05/2017 1359   MCV 95.9 09/06/2017 0750   MCV 97.5 01/05/2017 1359   MCH 33.5 01/05/2017 1359   MCH 33.4 12/04/2016 1548   MCHC 33.8 09/06/2017 0750   RDW 13.4 09/06/2017 0750   RDW 12.4 01/05/2017 1359   LYMPHSABS 3.0 01/05/2017 1359   MONOABS 0.6 01/05/2017 1359   EOSABS 0.1 01/05/2017 1359   BASOSABS 0.0 01/05/2017 1359    No results found for: POCLITH, LITHIUM   No results found for: PHENYTOIN, PHENOBARB, VALPROATE, CBMZ   .res Assessment: Plan:    Discussed treatment options for insomnia and patient reports that sleep has worsened without Dayvigo.  Discussed potential benefits, risks, and side effects of Belsomra.  Patient reports that she has some Belsomra 20 mg samples at home that she has not taken.  Also discussed 10-day free trial offer.  Patient agrees to start trial of Belsomra using samples and  free trial offer.  Will send in prescription to be put on file for Belsomra for a 30-day supply with refills if Belsomra is effective.  Advised patient to contact office if Belsomra is not effective and she would prefer to restart Dayvigo 10 mg po QHS. Continue Xanax as needed for insomnia and anxiety. Continue BuSpar 30 mg twice daily for anxiety. Continue Deplin 15 mg daily for depression and MTHFR mutation. Continue mirtazapine 15 mg at bedtime for mood and insomnia. Continue sertraline 200 mg daily for mood and anxiety. Patient to follow-up in 3 months or sooner if clinically indicated. Patient advised to contact office with any questions, adverse effects, or acute worsening in signs and symptoms.   Kathleen was seen today for insomnia and follow-up.  Diagnoses and all orders for this visit:  Primary insomnia -     Suvorexant (BELSOMRA) 20 MG TABS; Take 20 mg by mouth at bedtime for 10 days. -     Suvorexant (BELSOMRA) 20 MG TABS; Take 20 mg by mouth at bedtime. -     mirtazapine (REMERON) 15 MG tablet; TAKE 1  TABLET(15 MG) BY MOUTH AT BEDTIME  Generalized anxiety disorder -     busPIRone (BUSPAR) 30 MG tablet; Take 1 tablet (30 mg total) by mouth 2 (two) times daily. -     sertraline (ZOLOFT) 100 MG tablet; TAKE 2 TABLETS(200 MG) BY MOUTH AT BEDTIME  MDD (recurrent major depressive disorder) -     mirtazapine (REMERON) 15 MG tablet; TAKE 1 TABLET(15 MG) BY MOUTH AT BEDTIME -     sertraline (ZOLOFT) 100 MG tablet; TAKE 2 TABLETS(200 MG) BY MOUTH AT BEDTIME     Please see After Visit Summary for patient specific instructions.  Future Appointments  Date Time Provider [place]  06/01/2020 10:45 AM Kirsteins, Luanna Salk, MD [facility]  06/07/2020  9:20 AM Ranell Patrick, Clide Deutscher, MD [facility]  07/01/2020 10:20 AM Raulkar, Clide Deutscher, MD [facility]    No orders of the defined types were placed in this encounter.     -------------------------------

## 2020-05-14 ENCOUNTER — Telehealth: Payer: Self-pay | Admitting: Psychiatry

## 2020-05-14 NOTE — Telephone Encounter (Signed)
Pt said that she seems to be sleeping much better while taking Belsomra. Pt would like a rx sent in at [facility] on [address].

## 2020-05-26 ENCOUNTER — Telehealth: Payer: Self-pay | Admitting: Physical Medicine & Rehabilitation

## 2020-05-26 NOTE — Telephone Encounter (Signed)
Faith Thomas would like to add trigger points to her procedure on 12/28. Please advise if she can have this added.  Thanks

## 2020-05-27 NOTE — Telephone Encounter (Signed)
Cannot do at same visit as other injection.  Dr Carlis Abbott can do them at her visit

## 2020-06-01 ENCOUNTER — Other Ambulatory Visit: Payer: Self-pay

## 2020-06-01 ENCOUNTER — Telehealth: Payer: Self-pay | Admitting: Physical Medicine and Rehabilitation

## 2020-06-01 ENCOUNTER — Encounter: Payer: PRIVATE HEALTH INSURANCE | Admitting: Physical Medicine & Rehabilitation

## 2020-06-01 DIAGNOSIS — M7918 Myalgia, other site: Secondary | ICD-10-CM | POA: Insufficient documentation

## 2020-06-01 NOTE — Telephone Encounter (Signed)
Patient would like to know how frequent she may have trigger point injections.

## 2020-06-01 NOTE — Telephone Encounter (Signed)
They can be done as frequently as weekly

## 2020-06-02 ENCOUNTER — Other Ambulatory Visit: Payer: Self-pay | Admitting: Psychiatry

## 2020-06-02 DIAGNOSIS — F3340 Major depressive disorder, recurrent, in remission, unspecified: Secondary | ICD-10-CM

## 2020-06-02 DIAGNOSIS — Z1589 Genetic susceptibility to other disease: Secondary | ICD-10-CM

## 2020-06-07 ENCOUNTER — Other Ambulatory Visit: Payer: Self-pay

## 2020-06-07 ENCOUNTER — Encounter
Payer: PRIVATE HEALTH INSURANCE | Attending: Physical Medicine and Rehabilitation | Admitting: Physical Medicine and Rehabilitation

## 2020-06-07 ENCOUNTER — Encounter: Payer: Self-pay | Admitting: Physical Medicine and Rehabilitation

## 2020-06-07 VITALS — BP 117/83 | HR 90 | Temp 98.6°F | Ht 65.0 in | Wt 153.0 lb

## 2020-06-07 DIAGNOSIS — Z79891 Long term (current) use of opiate analgesic: Secondary | ICD-10-CM | POA: Insufficient documentation

## 2020-06-07 DIAGNOSIS — G894 Chronic pain syndrome: Secondary | ICD-10-CM | POA: Diagnosis present

## 2020-06-07 DIAGNOSIS — M5416 Radiculopathy, lumbar region: Secondary | ICD-10-CM | POA: Diagnosis present

## 2020-06-07 DIAGNOSIS — M7918 Myalgia, other site: Secondary | ICD-10-CM | POA: Insufficient documentation

## 2020-06-07 DIAGNOSIS — Z5181 Encounter for therapeutic drug level monitoring: Secondary | ICD-10-CM | POA: Insufficient documentation

## 2020-06-07 NOTE — Progress Notes (Signed)
Trigger Point Injection  Indication: Lumbar myofascial pain not relieved by medication management and other conservative care.  Informed consent was obtained after describing risk and benefits of the procedure with the patient, this includes bleeding, bruising, infection and medication side effects.  The patient wishes to proceed and has given written consent.  The patient was placed in a seated position.  The lumbar  area was marked and prepped with Betadine.  It was entered with a 25-gauge 1/2 inch needle and a total of 5 mL of 1% lidocaine and marcaine was injected into a total of 4 trigger points, after negative draw back for blood.  The patient tolerated the procedure well.  Post procedure instructions were given. Prior injection gave three weeks of benefit.

## 2020-06-15 ENCOUNTER — Other Ambulatory Visit: Payer: Self-pay | Admitting: Physical Medicine and Rehabilitation

## 2020-06-17 ENCOUNTER — Other Ambulatory Visit: Payer: Self-pay | Admitting: *Deleted

## 2020-06-22 ENCOUNTER — Other Ambulatory Visit: Payer: Self-pay

## 2020-06-22 ENCOUNTER — Encounter (HOSPITAL_BASED_OUTPATIENT_CLINIC_OR_DEPARTMENT_OTHER): Payer: PRIVATE HEALTH INSURANCE | Admitting: Physical Medicine & Rehabilitation

## 2020-06-22 ENCOUNTER — Encounter: Payer: Self-pay | Admitting: Physical Medicine & Rehabilitation

## 2020-06-22 VITALS — BP 116/80 | HR 105 | Temp 98.5°F | Ht 65.0 in | Wt 157.0 lb

## 2020-06-22 DIAGNOSIS — M5416 Radiculopathy, lumbar region: Secondary | ICD-10-CM

## 2020-06-22 DIAGNOSIS — M7918 Myalgia, other site: Secondary | ICD-10-CM | POA: Diagnosis not present

## 2020-06-22 NOTE — Progress Notes (Signed)
  PROCEDURE RECORD [facility]   Name: Faith Thomas DOB:03/27/1961 MRN: [id number]  Date:06/22/2020  Physician: Claudette Laws, MD    Nurse/CMA: Wessling, CMA  Allergies:  Allergies  Allergen Reactions  . Citalopram Tinitus  . Escitalopram Tinitus  . Bee Venom Swelling  . Latex Swelling  . Penicillins Swelling and Rash    Has patient had a PCN reaction causing immediate rash, facial/tongue/throat swelling, SOB or lightheadedness with hypotension: Yes Has patient had a PCN reaction causing severe rash involving mucus membranes or skin necrosis: No Has patient had a PCN reaction that required hospitalization: No Has patient had a PCN reaction occurring within the last 10 years: No If all of the above answers are "NO", then may proceed with Cephalosporin use.  Marland Kitchen Cymbalta [Duloxetine Hcl]     -memory loss  . Escitalopram Oxalate Tinitus  . Other     Yeast creams- causes secondary infections, itching, burning, redness   . Progesterone     Pt feels caused weakness, fatigue, cognitive deficits  . Trintellix [Vortioxetine]     Memory problems  . Bupropion Other (See Comments)    weakness and malaise.  . Duloxetine Other (See Comments)  . Gabapentin Other (See Comments)    Could not sleep.      Consent Signed: Yes.    Is patient diabetic? No.  CBG today?   Pregnant: No. LMP: No LMP recorded. Patient is postmenopausal. (age 20-55)  Anticoagulants: yes (Eliquis-none since Friday 06/18/20) Anti-inflammatory: no Antibiotics: no  Procedure: left L4,L5,S1 transforaminal epidural steroid injection  Position: Prone Start Time:10:51am   End Time:   Fluoro Time:   RN/CMA Hortence Charter,CMA Wessling,CMA    Time 10:34am 11:07am    BP 116/80 116/83    Pulse 105 91 `   Respirations 16 16    O2 Sat 98 97    S/S 6 6    Pain Level 7/10 5/10     D/C home with daughter , patient A & O X 3, D/C instructions reviewed, and sits  independently.

## 2020-06-22 NOTE — Patient Instructions (Signed)

## 2020-06-22 NOTE — Progress Notes (Signed)
Left L5 and Left S1  transforaminal epidural steroid injection under fluoroscopic guidance with contrast enhancement  Indication: Lumbosacral radiculitis is not relieved by medication management or other conservative care and interfering with self-care and mobility.   Pt off Eliquis for 3 days  Informed consent was obtained after describing risk and benefits of the procedure with the patient, this includes bleeding, bruising, infection, paralysis and medication side effects.  The patient wishes to proceed and has given written consent.  Patient was placed in prone position.  The lumbar area was marked and prepped with Betadine.  It was entered with a 25-gauge 1-1/2 inch needle and one mL of 1% lidocaine was injected into the skin and subcutaneous tissue.  Then a 22-gauge 3.5in spinal needle was inserted into the Left L5-S1 intervertebral foramen under AP, lateral, and oblique view.  Once needle tip was within the foramen on lateral views an dnor exceeding 6 o clock position on th epedical on AP viewed Isovue 200 was inected x 37ml Then a solution containing one mL of 10 mg per mL dexamethasone and 2 mL of 1% lidocaine was injected. THis same procedure was repeated at the S1 level using 22g 3.5" needle targeting the Left S1 foramen.  AP, Lateral views confirmed proper needle tip location and contrast enhancement under live fluoro demonstrated good epidural spread.   The patient tolerated procedure well.  Post procedure instructions were given.  Please see post procedure form.   May resume Eliquis in am

## 2020-06-25 ENCOUNTER — Other Ambulatory Visit: Payer: Self-pay | Admitting: Physical Medicine and Rehabilitation

## 2020-07-01 ENCOUNTER — Encounter (HOSPITAL_BASED_OUTPATIENT_CLINIC_OR_DEPARTMENT_OTHER): Payer: PRIVATE HEALTH INSURANCE | Admitting: Physical Medicine and Rehabilitation

## 2020-07-01 ENCOUNTER — Other Ambulatory Visit: Payer: Self-pay

## 2020-07-01 ENCOUNTER — Encounter: Payer: Self-pay | Admitting: Physical Medicine and Rehabilitation

## 2020-07-01 VITALS — BP 115/84 | HR 91 | Temp 98.9°F | Ht 65.0 in | Wt 157.0 lb

## 2020-07-01 DIAGNOSIS — G894 Chronic pain syndrome: Secondary | ICD-10-CM

## 2020-07-01 DIAGNOSIS — Z79891 Long term (current) use of opiate analgesic: Secondary | ICD-10-CM

## 2020-07-01 DIAGNOSIS — Z5181 Encounter for therapeutic drug level monitoring: Secondary | ICD-10-CM

## 2020-07-01 DIAGNOSIS — M7918 Myalgia, other site: Secondary | ICD-10-CM | POA: Diagnosis not present

## 2020-07-01 NOTE — Progress Notes (Signed)
Trigger Point Injection  Indication: Lumbar myofascial pain not relieved by medication management and other conservative care.  Informed consent was obtained after describing risk and benefits of the procedure with the patient, this includes bleeding, bruising, infection and medication side effects.  The patient wishes to proceed and has given written consent.  The patient was placed in a seated position.  The lumbar area was marked and prepped with Betadine.  It was entered with a 25-gauge 1/2 inch needle and a total of 5 mL of 1% lidocaine and normal saline was injected into a total of 4 trigger points, after negative draw back for blood.  The patient tolerated the procedure well.  Post procedure instructions were given.  Patient did report that she has been using CBD gummies for her pain. She is due for swab today. I told her that I am ok with her using CBD gummies.

## 2020-07-07 LAB — DRUG TOX MONITOR 1 W/CONF, ORAL FLD
Alprazolam: 2.1 ng/mL — ABNORMAL HIGH (ref <0.50)
Amphetamines: NEGATIVE ng/mL (ref <10)
Barbiturates: NEGATIVE ng/mL (ref <10)
Benzodiazepines: POSITIVE ng/mL — AB (ref <0.50)
Buprenorphine: NEGATIVE ng/mL (ref <0.10)
Chlordiazepoxide: NEGATIVE ng/mL (ref <0.50)
Clonazepam: NEGATIVE ng/mL (ref <0.50)
Cocaine: NEGATIVE ng/mL (ref <5.0)
Diazepam: NEGATIVE ng/mL (ref <0.50)
Fentanyl: NEGATIVE ng/mL (ref <0.10)
Flunitrazepam: NEGATIVE ng/mL (ref <0.50)
Flurazepam: NEGATIVE ng/mL (ref <0.50)
Heroin Metabolite: NEGATIVE ng/mL (ref <1.0)
Lorazepam: NEGATIVE ng/mL (ref <0.50)
MARIJUANA: NEGATIVE ng/mL (ref <2.5)
MDMA: NEGATIVE ng/mL (ref <10)
Meprobamate: NEGATIVE ng/mL (ref <2.5)
Methadone: NEGATIVE ng/mL (ref <5.0)
Midazolam: NEGATIVE ng/mL (ref <0.50)
Nicotine Metabolite: NEGATIVE ng/mL (ref <5.0)
Nordiazepam: NEGATIVE ng/mL (ref <0.50)
Opiates: NEGATIVE ng/mL (ref <2.5)
Oxazepam: NEGATIVE ng/mL (ref <0.50)
Phencyclidine: NEGATIVE ng/mL (ref <10)
Tapentadol: NEGATIVE ng/mL (ref <5.0)
Temazepam: NEGATIVE ng/mL (ref <0.50)
Tramadol: >500 ng/mL — ABNORMAL HIGH (ref <5.0)
Tramadol: POSITIVE ng/mL — AB (ref <5.0)
Triazolam: NEGATIVE ng/mL (ref <0.50)
Zolpidem: NEGATIVE ng/mL (ref <5.0)

## 2020-07-07 LAB — DRUG TOX ALC METAB W/CON, ORAL FLD: Alcohol Metabolite: NEGATIVE ng/mL (ref <25)

## 2020-07-09 ENCOUNTER — Encounter
Payer: PRIVATE HEALTH INSURANCE | Attending: Physical Medicine and Rehabilitation | Admitting: Physical Medicine and Rehabilitation

## 2020-07-09 ENCOUNTER — Encounter: Payer: Self-pay | Admitting: Physical Medicine and Rehabilitation

## 2020-07-09 ENCOUNTER — Other Ambulatory Visit: Payer: Self-pay

## 2020-07-09 VITALS — BP 104/73 | HR 89 | Temp 98.2°F | Ht 65.0 in | Wt 158.8 lb

## 2020-07-09 DIAGNOSIS — M47817 Spondylosis without myelopathy or radiculopathy, lumbosacral region: Secondary | ICD-10-CM | POA: Diagnosis present

## 2020-07-09 DIAGNOSIS — M7918 Myalgia, other site: Secondary | ICD-10-CM | POA: Insufficient documentation

## 2020-07-09 NOTE — Patient Instructions (Signed)
.  Turmeric to reduce inflammation--can be used in cooking or taken as a supplement.  Benefits of turmeric:  -Highly anti-inflammatory  -Increases antioxidants  -Improves memory, attention, brain disease  -Lowers risk of heart disease  -May help prevent cancer  -Decreases pain  -Alleviates depression  -Delays aging and decreases risk of chronic disease  -Consume with black pepper to increase absorption    Turmeric Milk Recipe:  1 cup milk  1 tsp turmeric  1 tsp cinnamon  1 tsp grated ginger (optional)  Black pepper (boosts the anti-inflammatory properties of turmeric).  1 tsp honey 

## 2020-07-09 NOTE — Progress Notes (Signed)
  Trigger Point Injection Indication: Lumbar and thoracic myofascial pain not relieved by medication management and other conservative care.  Informed consent was obtained after describing risk and benefits of the procedure with the patient, this includes bleeding, bruising, infection and medication side effects.  The patient wishes to proceed and has given written consent.  The patient was placed in a seated position.  The lumbar and thoracic areas was marked and prepped with Betadine.  It was entered with a 25-gauge 1/2 inch needle and a total of 5 mL of 1% lidocaine and normal saline were injected into a total of 7 trigger points, after negative draw back for blood.  The patient tolerated the procedure well.  Post procedure instructions were given.   Turmeric to reduce inflammation-can be used in cooking or taken as a supplement.  Benefits of turmeric:  -Highly anti-inflammatory  -Increases antioxidants  -Improves memory, attention, brain disease  -Lowers risk of heart disease  -May help prevent cancer  -Decreases pain  -Alleviates depression  -Delays aging and decreases risk of chronic disease  -Consume with black pepper to increase absorption    Turmeric Milk Recipe:  1 cup milk  1 tsp turmeric  1 tsp cinnamon  1 tsp grated ginger (optional)  Black pepper (boosts the anti-inflammatory properties of turmeric).  1 tsp honey

## 2020-07-12 ENCOUNTER — Telehealth: Payer: Self-pay | Admitting: *Deleted

## 2020-07-12 NOTE — Telephone Encounter (Signed)
Oral swab drug screen was consistent for prescribed medications.  ?

## 2020-07-20 ENCOUNTER — Encounter: Payer: PRIVATE HEALTH INSURANCE | Admitting: Physical Medicine and Rehabilitation

## 2020-07-23 ENCOUNTER — Encounter: Payer: Self-pay | Admitting: Physical Medicine and Rehabilitation

## 2020-07-23 ENCOUNTER — Encounter (HOSPITAL_BASED_OUTPATIENT_CLINIC_OR_DEPARTMENT_OTHER): Payer: PRIVATE HEALTH INSURANCE | Admitting: Physical Medicine and Rehabilitation

## 2020-07-23 ENCOUNTER — Other Ambulatory Visit: Payer: Self-pay

## 2020-07-23 VITALS — BP 111/72 | HR 82 | Temp 98.7°F | Ht 65.0 in | Wt 156.6 lb

## 2020-07-23 DIAGNOSIS — M7918 Myalgia, other site: Secondary | ICD-10-CM

## 2020-07-23 NOTE — Patient Instructions (Signed)
Prolotherapy

## 2020-07-23 NOTE — Progress Notes (Signed)
Trigger Point Injection  Indication: Lumbar myofascial pain not relieved by medication management and other conservative care.  Informed consent was obtained after describing risk and benefits of the procedure with the patient, this includes bleeding, bruising, infection and medication side effects.  The patient wishes to proceed and has given written consent.  The patient was placed in a seated position.  The lumbar area was marked and prepped with Betadine.  It was entered with a 25-gauge 1/2 inch needle and a total of 5 mL of 1% lidocaine and normal saline was injected into a total of 6 trigger points, after negative draw back for blood.  The patient tolerated the procedure well.  Post procedure instructions were given.

## 2020-07-27 ENCOUNTER — Encounter (HOSPITAL_BASED_OUTPATIENT_CLINIC_OR_DEPARTMENT_OTHER): Payer: PRIVATE HEALTH INSURANCE | Admitting: Physical Medicine & Rehabilitation

## 2020-07-27 ENCOUNTER — Other Ambulatory Visit: Payer: Self-pay

## 2020-07-27 ENCOUNTER — Encounter: Payer: Self-pay | Admitting: Physical Medicine & Rehabilitation

## 2020-07-27 VITALS — BP 112/79 | HR 82 | Temp 98.6°F | Ht 65.5 in | Wt 156.0 lb

## 2020-07-27 DIAGNOSIS — M47817 Spondylosis without myelopathy or radiculopathy, lumbosacral region: Secondary | ICD-10-CM

## 2020-07-27 DIAGNOSIS — M7918 Myalgia, other site: Secondary | ICD-10-CM | POA: Diagnosis not present

## 2020-07-27 NOTE — Patient Instructions (Signed)
You had a radio frequency procedure today This was done to alleviate joint pain in your lumbar area We injected lidocaine which is a local anesthetic.  You may experience soreness at the injection sites. You may also experienced some irritation of the nerves that were heated I'm recommending ice for 30 minutes every 2 hours as needed for the next 24-48 hours   

## 2020-07-27 NOTE — Progress Notes (Signed)
  PROCEDURE RECORD [facility]   Name: Faith Thomas DOB:1960-09-12 MRN: [id number]  Date:07/27/2020  Physician: Claudette Laws, MD    Nurse/CMA: Delories Mauri, CMA  Allergies:  Allergies  Allergen Reactions  . Citalopram Tinitus  . Escitalopram Tinitus  . Bee Venom Swelling  . Latex Swelling  . Penicillins Swelling and Rash    Has patient had a PCN reaction causing immediate rash, facial/tongue/throat swelling, SOB or lightheadedness with hypotension: Yes Has patient had a PCN reaction causing severe rash involving mucus membranes or skin necrosis: No Has patient had a PCN reaction that required hospitalization: No Has patient had a PCN reaction occurring within the last 10 years: No If all of the above answers are "NO", then may proceed with Cephalosporin use.  Marland Kitchen Cymbalta [Duloxetine Hcl]     -memory loss  . Escitalopram Oxalate Tinitus  . Other     Yeast creams- causes secondary infections, itching, burning, redness   . Progesterone     Pt feels caused weakness, fatigue, cognitive deficits  . Trintellix [Vortioxetine]     Memory problems  . Bupropion Other (See Comments)    weakness and malaise.  . Duloxetine Other (See Comments)  . Gabapentin Other (See Comments)    Could not sleep.      Consent Signed: Yes.    Is patient diabetic? No.  CBG today?   Pregnant: No. LMP: No LMP recorded. Patient is postmenopausal. (age 40-55)  Anticoagulants: yes (eliquis) Anti-inflammatory: no Antibiotics: no  Procedure: left lumbar 3,4,5 radiofrequency ablation  Position: Prone Start Time: 9:45am  End Time: 9:58am  Fluoro Time: 42s  RN/CMA Beacher Every, CMA Madoline Bhatt, CMA    Time 9:36am 10:04am    BP 112/79 114/77    Pulse 82 75    Respirations 16 16    O2 Sat 96 97    S/S 6 6    Pain Level 8/10 4/10     D/C home with self, patient A & O X 3, D/C instructions reviewed, and sits independently.

## 2020-07-27 NOTE — Progress Notes (Signed)
Left L5 dorsal ramus., left L4 and left L3 medial branch radio frequency neurotomy under fluoroscopic guidance  Indication: Low back pain due to lumbar spondylosis which has been relieved on 2 occasions by greater than 50% by lumbar medial branch blocks at corresponding levels.  Informed consent was obtained after describing risks and benefits of the procedure with the patient, this includes bleeding, bruising, infection, paralysis and medication side effects. The patient wishes to proceed and has given written consent. The patient was placed in a prone position. The lumbar and sacral area was marked and prepped with Betadine. A 25-gauge 1-1/2 inch needle was inserted into the skin and subcutaneous tissue at 3 sites in one ML of 2% lidocaine was injected into each site. Then a 18-gauge 10 cm radio frequency needle with a 1 cm curved active tip was inserted targeting the left S1 SAP/sacral ala junction. Bone contact was made and confirmed with lateral imaging.  motor stimulation at 2 Hz confirm proper needle location followed by injection of one ML of 2% MPF lidocaine. Then the left L5 SAP/transverse process junction was targeted. Bone contact was made and confirmed with lateral imaging motor stimulation at 2 Hz confirm proper needle location followed by injection of one ML of the solution containing one ML of  2% MPF lidocaine. Then the left L4 SAP/transverse process junction was targeted. Bone contact was made and confirmed with lateral imaging. motor stimulation at 2 Hz confirm proper needle location followed by injection of one ML of the solution containing one ML of2% MPF lidocaine.   Radio frequency lesion  at [facility] for 90 seconds was performed. Needles were removed. Post procedure instructions and vital signs were performed. Patient tolerated procedure well. Followup appointment was given.  Would recommend Valium 10mg  prior to next procedure , pt did not take today but usually does for procedure related  anxiety

## 2020-08-02 ENCOUNTER — Encounter: Payer: Self-pay | Admitting: Physical Medicine and Rehabilitation

## 2020-08-02 ENCOUNTER — Encounter: Payer: Self-pay | Admitting: Psychiatry

## 2020-08-02 ENCOUNTER — Ambulatory Visit (INDEPENDENT_AMBULATORY_CARE_PROVIDER_SITE_OTHER): Payer: PRIVATE HEALTH INSURANCE | Admitting: Psychiatry

## 2020-08-02 ENCOUNTER — Other Ambulatory Visit: Payer: Self-pay

## 2020-08-02 ENCOUNTER — Encounter (HOSPITAL_BASED_OUTPATIENT_CLINIC_OR_DEPARTMENT_OTHER): Payer: PRIVATE HEALTH INSURANCE | Admitting: Physical Medicine and Rehabilitation

## 2020-08-02 VITALS — BP 118/84 | HR 89 | Temp 98.2°F | Ht 65.5 in | Wt 156.0 lb

## 2020-08-02 DIAGNOSIS — F3340 Major depressive disorder, recurrent, in remission, unspecified: Secondary | ICD-10-CM

## 2020-08-02 DIAGNOSIS — F411 Generalized anxiety disorder: Secondary | ICD-10-CM | POA: Diagnosis not present

## 2020-08-02 DIAGNOSIS — F5101 Primary insomnia: Secondary | ICD-10-CM | POA: Diagnosis not present

## 2020-08-02 DIAGNOSIS — M7918 Myalgia, other site: Secondary | ICD-10-CM

## 2020-08-02 MED ORDER — SERTRALINE HCL 100 MG PO TABS
ORAL_TABLET | ORAL | 1 refills | Status: DC
Start: 2020-08-02 — End: 2020-10-28

## 2020-08-02 MED ORDER — ALPRAZOLAM 0.5 MG PO TABS: ORAL_TABLET | ORAL | 2 refills | Status: DC

## 2020-08-02 MED ORDER — BELSOMRA 20 MG PO TABS: 20.0000 mg | ORAL_TABLET | Freq: Every day | ORAL | 3 refills | Status: DC

## 2020-08-02 MED ORDER — BUSPIRONE HCL 30 MG PO TABS: 30.0000 mg | ORAL_TABLET | Freq: Two times a day (BID) | ORAL | 1 refills | Status: DC

## 2020-08-02 NOTE — Progress Notes (Signed)
Trigger Point Injection  Indication: Cervical, lumbar, and thoracic myofascial pain not relieved by medication management and other conservative care.  Informed consent was obtained after describing risk and benefits of the procedure with the patient, this includes bleeding, bruising, infection and medication side effects.  The patient wishes to proceed and has given written consent.  The patient was placed in a seated position.  The myofascial areas was marked and prepped with Betadine.  It was entered with a 25-gauge 1/2 inch needle and a total of 5 mL of 1% lidocaine and normal saline was injected into a total of 6 trigger points, after negative draw back for blood.  The patient tolerated the procedure well.  Post procedure instructions were given.

## 2020-08-02 NOTE — Progress Notes (Signed)
Faith Thomas [id number] 18-Oct-1960 60 y.o.  Subjective:   Patient ID:  Faith Thomas is a 60 y.o. (DOB 08/06/1960) female.  Chief Complaint:  Chief Complaint  Patient presents with  . Follow-up    Anxiety, depression, insomnia    HPI Faith Thomas presents to the office today for follow-up of anxiety, depression, and insomnia. Daughter and granddaughter, who is almost 30 years old, are living with her now. Daughter ended relationship with wife. Daughter started working for [employer]. Faith Thomas has been caretaking for granddaughter at times. Father is still living on the [facility]. She is going to visit him in late April/early May.   She continues to work with Aquilla Solian in therapy. She has been setting limits with family. She reports that she is more tired. Notices some occasional irritability. She reports that anxiety has been manageable with setting limits. She reports feelings of "oppression" with family frequently having needs and demands. She reports that sleep is usually "pretty good." Falling and staying asleep. Had a recent GI virus. Has experienced some stress eating. Has been using [employer] to prepare healthier meals. Appetite has been ok. Energy and motivation have been "medium." Concentration has been ok. Denies SI.   Plans to get together with friends again.   She reports that she has been working remotely. Granddaughter is in daycare during the day.   She reports that she has not been needing to use Xanax prn as often.   Past Medication Trials: Buspar-Effective Trintellix- memory difficulties Sertraline- Effective Cymbalta- memory difficulties Prozac-stomach pain  Paxil Celexa Lexapro Remeron- Effective Wellbutrin XL- Irritability Lamictal- ineffective Klonopin Xanax Doxepin Trazodone- stopped working [employer]- More effective.  Deplin Lyrica- hypomania Gabapentin- sleep disturbance Amitriptyline-sleeplessness, ineffective.  PHQ2-9    Flowsheet Row Office Visit from 07/09/2020 in [facility] Office Visit from 02/27/2020 in [facility] Office Visit from 04/15/2019 in [facility]2017 [facility] Total Score 0 0 0 6 2       Review of Systems:  Review of Systems  Gastrointestinal:       Recent GI virus.   Musculoskeletal: Negative for gait problem.       Pain in left side  Neurological: Positive for tremors.       Reports long-standing slight hand tremor since childhood.  Psychiatric/Behavioral:       Please refer to HPI    Medications: I have reviewed the patient's current medications.  Current Outpatient Medications  Medication Sig Dispense Refill  . acetaminophen (TYLENOL) 500 MG tablet Take 1,000 mg by mouth every 8 (eight) hours as needed.     . ALPRAZolam (XANAX) 0.5 MG tablet TAKE 1 TAB PO QHS AND 1/2-1 TABLET BY MOUTH TWICE DAILY AS NEEDED FOR ANXIETY 75 tablet 2  . apixaban (ELIQUIS) 5 MG TABS tablet Take 1 tablet (5 mg total) by mouth 2 (two) times daily. 180 tablet 1  . BLACK ELDERBERRY PO Take by mouth.    . busPIRone (BUSPAR) 30 MG tablet Take 1 tablet (30 mg total) by mouth 2 (two) times daily. 180 tablet 1  . CALCIUM PO Take by mouth.    . Cholecalciferol (VITAMIN D-3) 1000 units CAPS Take 5,000 Units by mouth daily.     . Estradiol 10 MCG TABS vaginal tablet Place 1 tablet vaginally 2 (two) times a  week.    . L-Methylfolate-Algae (DEPLIN 15) 15-90.314 MG CAPS TAKE 1 CAPSULE BY MOUTH EVERY DAY 90 capsule 3  . lisinopril (PRINIVIL,ZESTRIL) 10 MG tablet TAKE 1 TABLET BY MOUTH EVERY DAY 34 tablet 2  . Melatonin 3 MG CAPS Take by mouth.    . methocarbamol (ROBAXIN) 750 MG tablet TAKE 1 TABLET(750 MG) BY MOUTH THREE TIMES DAILY 90 tablet 3  . Multiple Vitamins-Minerals (ZINC PO) Take  by mouth.    . Omega-3 Fatty Acids (OMEGA 3 PO) Take by mouth.    . Probiotic Product (PROBIOTIC PO) Take 1 capsule by mouth daily.     . sertraline (ZOLOFT) 100 MG tablet TAKE 2 TABLETS(200 MG) BY MOUTH AT BEDTIME 180 tablet 1  . sucralfate (CARAFATE) 1 g tablet Take 1 g by mouth 2 (two) times daily as needed.     Melene Muller. [START ON 08/12/2020] Suvorexant (BELSOMRA) 20 MG TABS Take 20 mg by mouth at bedtime. 30 tablet 3  . traMADol (ULTRAM) 50 MG tablet TAKE 1 TABLET(50 MG) BY MOUTH THREE TIMES DAILY 90 tablet 1  . tretinoin (RETIN-A) 0.025 % cream APPLY TO AFFECTED SKIN AT BEDTIME    . valACYclovir (VALTREX) 1000 MG tablet Take 1,000 mg by mouth 3 (three) times daily.     Marland Kitchen. VITAMIN A PO Take by mouth.    . vitamin C (ASCORBIC ACID) 500 MG tablet Take 1,000 mg by mouth daily.     . vitamin E 180 MG (400 UNITS) capsule Take 400 Units by mouth daily.     No current facility-administered medications for this visit.    Medication Side Effects: None  Allergies:  Allergies  Allergen Reactions  . Citalopram Tinitus  . Escitalopram Tinitus  . Bee Venom Swelling  . Latex Swelling  . Penicillins Swelling and Rash    Has patient had a PCN reaction causing immediate rash, facial/tongue/throat swelling, SOB or lightheadedness with hypotension: Yes Has patient had a PCN reaction causing severe rash involving mucus membranes or skin necrosis: No Has patient had a PCN reaction that required hospitalization: No Has patient had a PCN reaction occurring within the last 10 years: No If all of the above answers are "NO", then may proceed with Cephalosporin use.  Marland Kitchen. Cymbalta [Duloxetine Hcl]     -memory loss  . Escitalopram Oxalate Tinitus  . Other     Yeast creams- causes secondary infections, itching, burning, redness   . Progesterone     Pt feels caused weakness, fatigue, cognitive deficits  . Trintellix [Vortioxetine]     Memory problems  . Bupropion Other (See Comments)    weakness and malaise.  .  Duloxetine Other (See Comments)  . Gabapentin Other (See Comments)    Could not sleep.      Past Medical History:  Diagnosis Date  . Allergy   . Depression   . DVT (deep venous thrombosis) (HCC)   . Fibromyalgia   . Genetic defect   . GERD (gastroesophageal reflux disease)   . History of IBS   . 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 Hypertension   . Osteoporosis   . PE (pulmonary thromboembolism) (HCC)   . Positive TB test   . Raynaud disease   . Scoliosis   . Urine incontinence     Family History  Problem Relation Age of Onset  . Arthritis Mother   . Hypertension Mother   . Mental illness Mother   . Anxiety disorder Mother   . Depression Mother   .  Arthritis Father   . Hypertension Father   . Mental illness Father   . Anxiety disorder Father   . Depression Father   . Mental illness Brother   . ADD / ADHD Brother   . Psychosis Brother   . Post-traumatic stress disorder Brother   . Arthritis Daughter   . Autism Daughter   . Cancer Maternal Aunt   . Autism Maternal Aunt   . Hypertension Maternal Grandmother   . Arthritis Maternal Grandfather   . Depression Cousin   . Anxiety disorder Cousin   . Depression Maternal Aunt     Social History   Socioeconomic History  . Marital status: Divorced    Spouse name: Not on file  . Number of children: Not on file  . Years of education: Not on file  . Highest education level: Not on file  Occupational History  . Not on file  Tobacco Use  . Smoking status: Never Smoker  . Smokeless tobacco: Never Used  Vaping Use  . Vaping Use: Never used  Substance and Sexual Activity  . Alcohol use: Yes    Alcohol/week: 0.0 standard drinks    Comment: occasional   . Drug use: No  . Sexual activity: Not on file  Other Topics Concern  . 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   Please see review of systems for further details on the patient's review from today.   Objective:   Physical Exam:  There were no vitals taken for this visit.  Physical Exam Constitutional:      General: She is not in acute distress. Musculoskeletal:        General: No deformity.  Neurological:     Mental Status: She is alert and oriented to person, place, and time.     Coordination: Coordination normal.  Psychiatric:        Attention and Perception: Attention and perception normal. She does not perceive auditory or visual hallucinations.        Mood and Affect: Mood normal. Mood is not anxious or depressed. Affect is not labile, blunt, angry or inappropriate.        Speech: Speech normal.        Behavior: Behavior normal.        Thought Content: Thought content normal. Thought content is not paranoid or delusional. Thought content does not include homicidal or suicidal ideation. Thought content does not include homicidal or suicidal plan.        Cognition and Memory: Cognition and memory normal.        Judgment: Judgment normal.     Comments: Insight intact     Lab Review:     Component Value Date/Time   NA 137 09/06/2017 0750   NA 134 (L) 01/05/2017 1359   K 4.5 09/06/2017 0750   K 3.9 01/05/2017 1359   CL 100 09/06/2017 0750   CO2 29 09/06/2017 0750   CO2 25 01/05/2017 1359   GLUCOSE 81 09/06/2017 0750   GLUCOSE 106  01/05/2017 1359   BUN 17 09/06/2017 0750   BUN 10.2 01/05/2017 1359   CREATININE 0.85 09/06/2017 0750   CREATININE 0.8 01/05/2017 1359   CALCIUM 9.7 09/06/2017 0750   CALCIUM 9.5 01/05/2017 1359   PROT 6.4 01/05/2017  1359   ALBUMIN 4.2 01/05/2017 1359   AST 18 01/05/2017 1359   ALT 15 01/05/2017 1359   ALKPHOS 60 01/05/2017 1359   BILITOT 0.22 01/05/2017 1359   GFRNONAA >60 12/04/2016 1548   GFRAA >60 12/04/2016 1548       Component Value Date/Time   WBC 5.2 09/06/2017 0750   RBC 4.29 09/06/2017 0750   HGB 13.9 09/06/2017 0750   HGB 13.4 01/05/2017 1359   HCT 41.1 09/06/2017 0750   HCT 39.0 01/05/2017 1359   PLT 287.0 09/06/2017 0750   PLT 274 01/05/2017 1359   MCV 95.9 09/06/2017 0750   MCV 97.5 01/05/2017 1359   MCH 33.5 01/05/2017 1359   MCH 33.4 12/04/2016 1548   MCHC 33.8 09/06/2017 0750   RDW 13.4 09/06/2017 0750   RDW 12.4 01/05/2017 1359   LYMPHSABS 3.0 01/05/2017 1359   MONOABS 0.6 01/05/2017 1359   EOSABS 0.1 01/05/2017 1359   BASOSABS 0.0 01/05/2017 1359    No results found for: POCLITH, LITHIUM   No results found for: PHENYTOIN, PHENOBARB, VALPROATE, CBMZ   .res Assessment: Plan:   Will continue current plan of care since target signs and symptoms are well controlled without any tolerability issues. Continue Belsomra 20 mg po QHS for insomnia since pt reports that this has been effective for her.  Continue Sertraline 200 mg po qd for anxiety and depression.  Continue Buspar 30 mg po BID for anxiety.  Continue Xanax for insomnia and anxiety.  Pt to follow-up in 4 months or sooner if clinically indicated.  Patient advised to contact office with any questions, adverse effects, or acute worsening in signs and symptoms.  Faith Thomas was seen today for follow-up.  Diagnoses and all orders for this visit:  Generalized anxiety disorder -     ALPRAZolam (XANAX) 0.5 MG tablet; TAKE 1 TAB PO QHS AND 1/2-1 TABLET BY MOUTH TWICE DAILY AS NEEDED FOR ANXIETY -     busPIRone (BUSPAR) 30 MG tablet; Take 1 tablet (30 mg total) by mouth 2 (two) times daily. -     sertraline (ZOLOFT) 100 MG tablet; TAKE 2 TABLETS(200 MG) BY MOUTH AT BEDTIME  MDD (recurrent major depressive disorder) -      sertraline (ZOLOFT) 100 MG tablet; TAKE 2 TABLETS(200 MG) BY MOUTH AT BEDTIME  Primary insomnia -     Suvorexant (BELSOMRA) 20 MG TABS; Take 20 mg by mouth at bedtime.     Please see After Visit Summary for patient specific instructions.  Future Appointments  Date Time Provider Department Center  08/16/2020  9:40 AM Raulkar, Drema Pry, MD [facility]  09/16/2020 10:20 AM Carlis Abbott, Drema Pry, MD [facility]  09/21/2020 10:30 AM Kirsteins, Victorino Sparrow, MD [facility]  11/29/2020 10:00 AM Corie Chiquito, PMHNP [facility] None    No orders of the defined types were placed in this encounter.   -------------------------------

## 2020-08-16 ENCOUNTER — Encounter
Payer: PRIVATE HEALTH INSURANCE | Attending: Physical Medicine and Rehabilitation | Admitting: Physical Medicine and Rehabilitation

## 2020-08-16 ENCOUNTER — Encounter: Payer: Self-pay | Admitting: Physical Medicine and Rehabilitation

## 2020-08-16 ENCOUNTER — Other Ambulatory Visit: Payer: Self-pay

## 2020-08-16 VITALS — BP 117/81 | HR 91 | Temp 99.0°F | Ht 65.5 in | Wt 157.0 lb

## 2020-08-16 DIAGNOSIS — M7918 Myalgia, other site: Secondary | ICD-10-CM | POA: Diagnosis not present

## 2020-08-16 NOTE — Progress Notes (Signed)
Trigger Point Injection  Indication: Lumbar myofascial pain not relieved by medication management and other conservative care.  Informed consent was obtained after describing risk and benefits of the procedure with the patient, this includes bleeding, bruising, infection and medication side effects.  The patient wishes to proceed and has given written consent.  The patient was placed in a seated position.  The lumbar area was marked and prepped with Betadine.  It was entered with a 25-gauge 1/2 inch needle and a total of 5 mL of 1% lidocaine and normal saline was injected into a total of 7 trigger points, after negative draw back for blood.  The patient tolerated the procedure well.  Post procedure instructions were given.

## 2020-09-08 ENCOUNTER — Other Ambulatory Visit: Payer: Self-pay | Admitting: Physical Medicine and Rehabilitation

## 2020-09-08 MED ORDER — TRAMADOL HCL 50 MG PO TABS: 50.0000 mg | ORAL_TABLET | Freq: Three times a day (TID) | ORAL | 0 refills | Status: DC | PRN

## 2020-09-16 ENCOUNTER — Encounter: Payer: Self-pay | Admitting: Physical Medicine and Rehabilitation

## 2020-09-16 ENCOUNTER — Other Ambulatory Visit: Payer: Self-pay

## 2020-09-16 ENCOUNTER — Encounter
Payer: PRIVATE HEALTH INSURANCE | Attending: Physical Medicine and Rehabilitation | Admitting: Physical Medicine and Rehabilitation

## 2020-09-16 VITALS — BP 113/78 | HR 99 | Temp 98.4°F | Ht 65.5 in | Wt 155.0 lb

## 2020-09-16 DIAGNOSIS — M5416 Radiculopathy, lumbar region: Secondary | ICD-10-CM | POA: Insufficient documentation

## 2020-09-16 DIAGNOSIS — M7918 Myalgia, other site: Secondary | ICD-10-CM | POA: Insufficient documentation

## 2020-09-16 NOTE — Progress Notes (Signed)
Trigger Point Injection  Indication: Myofascial pain not relieved by medication management and other conservative care.  Informed consent was obtained after describing risk and benefits of the procedure with the patient, this includes bleeding, bruising, infection and medication side effects.  The patient wishes to proceed and has given written consent.  The patient was placed in a seated position.  The cervical area was marked and prepped with Betadine.  It was entered with a 25-gauge 1/2 inch needle and a total of 5 mL of 1% lidocaine was injected into a total of 4 trigger points, after negative draw back for blood.  The patient tolerated the procedure well.  Post procedure instructions were given.

## 2020-09-21 ENCOUNTER — Encounter (HOSPITAL_BASED_OUTPATIENT_CLINIC_OR_DEPARTMENT_OTHER): Payer: PRIVATE HEALTH INSURANCE | Admitting: Physical Medicine & Rehabilitation

## 2020-09-21 ENCOUNTER — Other Ambulatory Visit: Payer: Self-pay

## 2020-09-21 ENCOUNTER — Encounter: Payer: Self-pay | Admitting: Physical Medicine & Rehabilitation

## 2020-09-21 VITALS — BP 111/78 | HR 84 | Temp 98.5°F | Ht 65.5 in | Wt 157.0 lb

## 2020-09-21 DIAGNOSIS — M5416 Radiculopathy, lumbar region: Secondary | ICD-10-CM

## 2020-09-21 DIAGNOSIS — M7918 Myalgia, other site: Secondary | ICD-10-CM | POA: Diagnosis not present

## 2020-09-21 NOTE — Progress Notes (Signed)
Left L5 and Left S1  transforaminal epidural steroid injection under fluoroscopic guidance with contrast enhancement  Indication: Lumbosacral radiculitis is not relieved by medication management or other conservative care and interfering with self-care and mobility.   Pt off Eliquis for 3 days  Informed consent was obtained after describing risk and benefits of the procedure with the patient, this includes bleeding, bruising, infection, paralysis and medication side effects.  The patient wishes to proceed and has given written consent.  Patient was placed in prone position.  The lumbar area was marked and prepped with Betadine.  It was entered with a 25-gauge 1-1/2 inch needle and one mL of 1% lidocaine was injected into the skin and subcutaneous tissue.  Then a 22-gauge 3.5in spinal needle was inserted into the Left L5-S1 intervertebral foramen under AP, lateral, and oblique view.  Once needle tip was within the foramen on lateral views an dnor exceeding 6 o clock position on th epedical on AP viewed Isovue 200 was inected x 59ml Then a solution containing one mL of 10 mg per mL dexamethasone and 2 mL of 1% lidocaine was injected. THis same procedure was repeated at the S1 level using 22g 3.5" needle targeting the Left S1 foramen.  AP, Lateral views confirmed proper needle tip location and contrast enhancement under live fluoro demonstrated good epidural spread.   The patient tolerated procedure well.  Post procedure instructions were given.  Please see post procedure form.   May resume Eliquis in am

## 2020-09-21 NOTE — Progress Notes (Signed)
  PROCEDURE RECORD [place] [facility]   Name: Faith Thomas DOB:06/13/1960 MRN: [id number]  Date:09/21/2020  Physician: Claudette Laws, MD    Nurse/CMA: Angela Nevin CMA  Allergies:  Allergies  Allergen Reactions  . Citalopram Tinitus  . Escitalopram Tinitus  . Bee Venom Swelling  . Latex Swelling  . Penicillins Swelling and Rash    Has patient had a PCN reaction causing immediate rash, facial/tongue/throat swelling, SOB or lightheadedness with hypotension: Yes Has patient had a PCN reaction causing severe rash involving mucus membranes or skin necrosis: No Has patient had a PCN reaction that required hospitalization: No Has patient had a PCN reaction occurring within the last 10 years: No If all of the above answers are "NO", then may proceed with Cephalosporin use.  Marland Kitchen Cymbalta [Duloxetine Hcl]     -memory loss  . Escitalopram Oxalate Tinitus  . Other     Yeast creams- causes secondary infections, itching, burning, redness   . Progesterone     Pt feels caused weakness, fatigue, cognitive deficits  . Trintellix [Vortioxetine]     Memory problems  . Bupropion Other (See Comments)    weakness and malaise.  . Duloxetine Other (See Comments)  . Gabapentin Other (See Comments)    Could not sleep.      Consent Signed: Yes.    Is patient diabetic? No.  CBG today? N/A  Pregnant: No. LMP: No LMP recorded. Patient is postmenopausal. (age 33-55)  Anticoagulants: Stopped Eliquis Anti-inflammatory: no Antibiotics: no  Procedure:Left lumbar4-5, lumbar 5-Sacral 1 transforaminal epidural steroid injection   Position: Prone Start Time:10:50am     End Time: 10:56am  Fluoro Time:37s  RN/CMA Carolin Coy, CMA    Time 10:46 AM 11:00am6    BP 111/78 117/83    Pulse 84 82    Respirations 16 16    O2 Sat 98 97    S/S 6 6    Pain Level 4/10 2/10     D/C home with daughter, patient A & O X 3, D/C instructions reviewed, and sits  independently.

## 2020-09-21 NOTE — Patient Instructions (Signed)
You received an epidural steroid injection under fluoroscopic guidance. This is the most accurate way to perform an epidural injection. This injection was performed to relieve thigh or leg or foot pain that may be related to a pinched nerve in the lumbar spine. The local anesthetic injected today may cause numbness in your leg for a couple hours. If it is severe we may need to observe you for 30-60 minutes after the injection. The cortisone medicine injected today may take several days to take full effect. This medicine can also cause facial flushing or feeling of being warm.  This injection may last for days weeks or months. It can be repeated if needed. If it is not effective, another spinal level may need to be injected. Other treatments include medication management as well as physical therapy. In some cases surgery may be an option. Left L5 and Left S1

## 2020-09-23 ENCOUNTER — Telehealth: Payer: Self-pay | Admitting: *Deleted

## 2020-09-23 NOTE — Telephone Encounter (Signed)
Patient informed.  She states it is clearing up.  She just wanted [place] to know so it is documented.  She had similar reaction previously with an antibiotic and it got worse the next time around.

## 2020-09-23 NOTE — Telephone Encounter (Signed)
Patient left a message that she is experiencing fascial flushing since recent ESI.  She looked on internet and saw this is a possible listed side effect.  She is asking if this is concerning? If there is any treatment that Dr. Wynn Banker recommends?

## 2020-10-11 ENCOUNTER — Other Ambulatory Visit: Payer: Self-pay | Admitting: Physical Medicine and Rehabilitation

## 2020-10-11 DIAGNOSIS — M25559 Pain in unspecified hip: Secondary | ICD-10-CM

## 2020-10-13 ENCOUNTER — Other Ambulatory Visit: Payer: Self-pay | Admitting: Physical Medicine and Rehabilitation

## 2020-10-14 ENCOUNTER — Emergency Department (HOSPITAL_COMMUNITY): Payer: PRIVATE HEALTH INSURANCE

## 2020-10-14 ENCOUNTER — Encounter (HOSPITAL_COMMUNITY): Payer: Self-pay

## 2020-10-14 ENCOUNTER — Other Ambulatory Visit: Payer: Self-pay

## 2020-10-14 ENCOUNTER — Emergency Department (HOSPITAL_COMMUNITY)
Admission: EM | Admit: 2020-10-14 | Discharge: 2020-10-14 | Disposition: A | Discharge status: Home / Self Care | Payer: PRIVATE HEALTH INSURANCE | Attending: Emergency Medicine | Admitting: Emergency Medicine

## 2020-10-14 DIAGNOSIS — R42 Dizziness and giddiness: Secondary | ICD-10-CM | POA: Insufficient documentation

## 2020-10-14 DIAGNOSIS — R413 Other amnesia: Secondary | ICD-10-CM | POA: Diagnosis not present

## 2020-10-14 DIAGNOSIS — I10 Essential (primary) hypertension: Secondary | ICD-10-CM | POA: Insufficient documentation

## 2020-10-14 DIAGNOSIS — R519 Headache, unspecified: Secondary | ICD-10-CM | POA: Insufficient documentation

## 2020-10-14 DIAGNOSIS — Z79899 Other long term (current) drug therapy: Secondary | ICD-10-CM | POA: Insufficient documentation

## 2020-10-14 DIAGNOSIS — Z91040 Latex allergy status: Secondary | ICD-10-CM | POA: Diagnosis not present

## 2020-10-14 DIAGNOSIS — R0981 Nasal congestion: Secondary | ICD-10-CM | POA: Diagnosis not present

## 2020-10-14 DIAGNOSIS — Z7901 Long term (current) use of anticoagulants: Secondary | ICD-10-CM | POA: Diagnosis not present

## 2020-10-14 LAB — CBC WITH DIFFERENTIAL/PLATELET
Abs Immature Granulocytes: 0 10*3/uL (ref 0.00–0.07)
Basophils Absolute: 0 10*3/uL (ref 0.0–0.1)
Basophils Relative: 1 %
Eosinophils Absolute: 0.1 10*3/uL (ref 0.0–0.5)
Eosinophils Relative: 1 %
HCT: 43.5 % (ref 36.0–46.0)
Hemoglobin: 14.4 g/dL (ref 12.0–15.0)
Immature Granulocytes: 0 %
Lymphocytes Relative: 42 %
Lymphs Abs: 2.1 10*3/uL (ref 0.7–4.0)
MCH: 33.3 pg (ref 26.0–34.0)
MCHC: 33.1 g/dL (ref 30.0–36.0)
MCV: 100.5 fL — ABNORMAL HIGH (ref 80.0–100.0)
Monocytes Absolute: 0.5 10*3/uL (ref 0.1–1.0)
Monocytes Relative: 11 %
Neutro Abs: 2.3 10*3/uL (ref 1.7–7.7)
Neutrophils Relative %: 45 %
Platelets: 300 10*3/uL (ref 150–400)
RBC: 4.33 MIL/uL (ref 3.87–5.11)
RDW: 12.6 % (ref 11.5–15.5)
WBC: 5.1 10*3/uL (ref 4.0–10.5)
nRBC: 0 % (ref 0.0–0.2)

## 2020-10-14 LAB — BASIC METABOLIC PANEL
Anion gap: 4 — ABNORMAL LOW (ref 5–15)
BUN: 18 mg/dL (ref 6–20)
CO2: 31 mmol/L (ref 22–32)
Calcium: 9.4 mg/dL (ref 8.9–10.3)
Chloride: 100 mmol/L (ref 98–111)
Creatinine, Ser: 0.86 mg/dL (ref 0.44–1.00)
GFR, Estimated: >60 mL/min (ref >60)
Glucose, Bld: 96 mg/dL (ref 70–99)
Potassium: 3.8 mmol/L (ref 3.5–5.1)
Sodium: 135 mmol/L (ref 135–145)

## 2020-10-14 MED ORDER — MECLIZINE HCL 25 MG PO TABS: 25.0000 mg | ORAL_TABLET | Freq: Three times a day (TID) | ORAL | 0 refills | Status: DC | PRN

## 2020-10-14 NOTE — ED Provider Notes (Signed)
[facility]-EMERGENCY DEPT Provider Note   CSN: [id number] Arrival date & time: 10/14/20  1011     History Chief Complaint  Patient presents with  . Memory Loss    Faith Thomas is a 60 y.o. female.  The history is provided by the patient and medical records. No language interpreter was used.     60 year old female significant hx of PE currently on Eliquis, allergies, depression, fibromyalgia who was sent here from urgent care center for evaluation of dizziness.  Patient report for the past 2 to 3 days she has noticed sensation of sinus congestion, muffled ear, mild headache, feeling dizzy and described as head congestion and feeling imbalance.  She believes she was developing sinus congestion and have been using Sudafed, nasal spray, nasal steroid for her symptoms.  She went to urgent care center for her complaint but they felt patient would need to be evaluated in the ED for further evaluation.  No prior history of stroke.  Past Medical History:  Diagnosis Date  . Allergy   . Depression   . DVT (deep venous thrombosis) (HCC)   . Fibromyalgia   . Genetic defect   . GERD (gastroesophageal reflux disease)   . History of IBS   . 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 Hypertension   . Osteoporosis   . PE (pulmonary thromboembolism) (HCC)   . Positive TB test   . Raynaud disease   . Scoliosis   . Urine incontinence     Patient Active Problem List   Diagnosis Date Noted  . Generalized anxiety disorder 03/31/2018  . 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 Hx pulmonary embolism 09/05/2017  . MDD (recurrent major depressive disorder) 09/05/2017  . GERD (gastroesophageal reflux disease) 09/05/2017  . Seasonal allergies 09/05/2017  . 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 Chronic fatigue 11/28/2016  . Had genetic testing with psychiatry 11/28/2016  . Anxiety 10/26/2016  . Fibromyalgia 10/26/2016    History reviewed. No pertinent  surgical history.   OB History   No obstetric history on file.     Family History  Problem Relation Age of Onset  . Arthritis Mother   . Hypertension Mother   . Mental illness Mother   . Anxiety disorder Mother   . Depression Mother   . Arthritis Father   . Hypertension Father   . Mental illness Father   . Anxiety disorder Father   . Depression Father   . Mental illness Brother   . ADD / ADHD Brother   . Psychosis Brother   . Post-traumatic stress disorder Brother   . Arthritis Daughter   . Autism Daughter   . Cancer Maternal Aunt   . Autism Maternal Aunt   . Hypertension Maternal Grandmother   . Arthritis Maternal Grandfather   . Depression Cousin   . Anxiety disorder Cousin   . Depression Maternal Aunt     Social History   Tobacco Use  . Smoking status: Never Smoker  . Smokeless tobacco: Never Used  Vaping Use  . Vaping Use: Never used  Substance Use Topics  . Alcohol use: Yes    Alcohol/week: 0.0 standard drinks    Comment: occasional   . Drug use: No    Home Medications Prior to Admission medications   Medication Sig Start Date End Date Taking? Authorizing Provider  acetaminophen (TYLENOL) 500 MG tablet Take 1,000 mg by mouth every 8 (eight) hours as needed.     [provider]  ALPRAZolam (XANAX) 0.5 MG tablet TAKE 1 TAB PO QHS AND 1/2-1 TABLET BY MOUTH TWICE DAILY AS NEEDED FOR ANXIETY 08/02/20   Corie Chiquito, PMHNP  apixaban (ELIQUIS) 5 MG TABS tablet Take 1 tablet (5 mg total) by mouth 2 (two) times daily. 01/24/18   Terressa Koyanagi, DO  busPIRone (BUSPAR) 30 MG tablet Take 1 tablet (30 mg total) by mouth 2 (two) times daily. 08/02/20   Corie Chiquito, PMHNP  CALCIUM PO Take by mouth.    [provider]  Cholecalciferol (VITAMIN D-3) 1000 units CAPS Take 5,000 Units by mouth daily.     [provider]  Estradiol 10 MCG TABS vaginal tablet Place 1 tablet vaginally 2 (two) times a week. 02/23/20   [provider]   L-Methylfolate-Algae (DEPLIN 15) 15-90.314 MG CAPS TAKE 1 CAPSULE BY MOUTH EVERY DAY 06/02/20   Corie Chiquito, PMHNP  lisinopril (PRINIVIL,ZESTRIL) 10 MG tablet TAKE 1 TABLET BY MOUTH EVERY DAY 01/11/18   Terressa Koyanagi, DO  Melatonin 3 MG CAPS Take by mouth.    [provider]  methocarbamol (ROBAXIN) 750 MG tablet TAKE 1 TABLET(750 MG) BY MOUTH THREE TIMES DAILY 06/17/20   Raulkar, Drema Pry, MD  Multiple Vitamins-Minerals (ZINC PO) Take by mouth.    [provider]  Omega-3 Fatty Acids (OMEGA 3 PO) Take by mouth.    [provider]  Probiotic Product (PROBIOTIC PO) Take 1 capsule by mouth daily.     [provider]  sertraline (ZOLOFT) 100 MG tablet TAKE 2 TABLETS(200 MG) BY MOUTH AT BEDTIME 08/02/20   Corie Chiquito, PMHNP  sucralfate (CARAFATE) 1 g tablet Take 1 g by mouth 2 (two) times daily as needed.  11/08/18   [provider]  Suvorexant (BELSOMRA) 20 MG TABS Take 20 mg by mouth at bedtime. 08/12/20   Corie Chiquito, PMHNP  traMADol (ULTRAM) 50 MG tablet TAKE 1 TABLET BY MOUTH THREE TIMES DAILY AS NEEDED 10/14/20   Raulkar, Drema Pry, MD  tretinoin (RETIN-A) 0.025 % cream APPLY TO AFFECTED SKIN AT BEDTIME 03/16/20   [provider]  valACYclovir (VALTREX) 1000 MG tablet Take 1,000 mg by mouth 3 (three) times daily.  02/06/20   [provider]  VITAMIN A PO Take by mouth.    [provider]  vitamin C (ASCORBIC ACID) 500 MG tablet Take 1,000 mg by mouth daily.     [provider]  vitamin E 180 MG (400 UNITS) capsule Take 400 Units by mouth daily.    [provider]    Allergies    Citalopram, Escitalopram, Bee venom, Latex, Penicillins, Cymbalta [duloxetine hcl], Escitalopram oxalate, Other, Progesterone, Trintellix [vortioxetine], Bupropion, Duloxetine, and Gabapentin  Review of Systems   Review of Systems  All other systems reviewed and are negative.   Physical Exam Updated Vital Signs BP  119/81 (BP Location: Left Arm)   Pulse 90   Temp (!) 97.5 F (36.4 C) (Oral)   Resp 17   SpO2 100%   Physical Exam Vitals and nursing note reviewed.  Constitutional:      General: She is not in acute distress.    Appearance: She is well-developed.  HENT:     Head: Atraumatic.     Right Ear: Tympanic membrane normal.     Left Ear: Tympanic membrane normal.     Nose: Nose normal.     Mouth/Throat:     Mouth: Mucous membranes are dry.  Eyes:     Extraocular Movements: Extraocular  movements intact.     Conjunctiva/sclera: Conjunctivae normal.     Pupils: Pupils are equal, round, and reactive to light.  Cardiovascular:     Rate and Rhythm: Normal rate and regular rhythm.     Pulses: Normal pulses.     Heart sounds: Normal heart sounds.  Pulmonary:     Effort: Pulmonary effort is normal.     Breath sounds: Normal breath sounds. No wheezing.  Abdominal:     Palpations: Abdomen is soft.     Tenderness: There is no abdominal tenderness.  Musculoskeletal:        General: Normal range of motion.     Cervical back: Normal range of motion and neck supple. No rigidity.  Skin:    Findings: No rash.  Neurological:     Mental Status: She is alert and oriented to person, place, and time.     GCS: GCS eye subscore is 4. GCS verbal subscore is 5. GCS motor subscore is 6.     Cranial Nerves: Cranial nerves are intact.     Sensory: Sensation is intact.     Motor: Motor function is intact.     Coordination: Coordination is intact.     Gait: Gait is intact.     Comments: Able to perform 3 word recall after 10 minutes without difficulty.  Psychiatric:        Mood and Affect: Mood normal.     ED Results / Procedures / Treatments   Labs (all labs ordered are listed, but only abnormal results are displayed) Labs Reviewed  CBC WITH DIFFERENTIAL/PLATELET - Abnormal; Notable for the following components:      Result Value   MCV 100.5 (*)    All other components within normal limits   BASIC METABOLIC PANEL - Abnormal; Notable for the following components:   Anion gap 4 (*)    All other components within normal limits    EKG None  Radiology CT Head Wo Contrast  Result Date: 10/14/2020 CLINICAL DATA:  Neuro deficit. Acute stroke suspected. Headache and dizziness. Memory issues. EXAM: CT HEAD WITHOUT CONTRAST TECHNIQUE: Contiguous axial images were obtained from the base of the skull through the vertex without intravenous contrast. COMPARISON:  None. FINDINGS: Brain: No subdural, epidural, or subarachnoid hemorrhage. Ventricles and sulci are unremarkable. Cerebellum, brainstem, and basal cisterns are normal. Mild white matter changes in the corona radiata on the left as seen on series 2, image 14. No acute cortical ischemia or infarct. No mass effect or midline shift. Vascular: No hyperdense vessel or unexpected calcification. Skull: Normal. Negative for fracture or focal lesion. Sinuses/Orbits: No acute finding. Other: None. IMPRESSION: No acute intracranial abnormalities. Mild white matter changes in the left corona radiata. Electronically Signed   By: Gerome Samavid  Williams III M.D   On: 10/14/2020 11:46    Procedures Procedures   Medications Ordered in ED Medications - No data to display  ED Course  I have reviewed the triage vital signs and the nursing notes.  Pertinent labs & imaging results that were available during my care of the patient were reviewed by me and considered in my medical decision making (see chart for details).    MDM Rules/Calculators/A&P                          BP 106/71 (BP Location: Right Arm)   Pulse 78   Temp (!) 97.5 F (36.4 C) (Oral)   Resp 18   SpO2 99%  Final Clinical Impression(s) / ED Diagnoses Final diagnoses:  Dizziness  Sinus congestion    Rx / DC Orders ED Discharge Orders    None     10:42 AM Patient sent here from urgent care center due to head congestion, and dizziness and "memory loss".  Patient overall  well-appearing without any focal neurodeficit on exam, initially reported that her blood pressure was elevated but she has been on several different OTC medication that may increase her blood pressure such as Sudafed.  Blood pressure is fine here.  She is on Eliquis does endorse headache.  Head CT scan ordered however I have very low suspicion for intracranial head bleed in the setting of no focal neurodeficit and patient is answering questions appropriately.  12:37 PM Patient has normal orthostatic vital sign, labs are reassuring, no anemia, no leukocytosis, electrolytes are reassuring, head CT scan without any acute finding.  She ambulate with a steady gait and reported feeling much better despite receiving any treatments.  I have very low suspicion for acute stroke or any other acute emergent medical condition.  Will discharge home with meclizine as needed for dizziness which she described as a room spinning sensation.  I suspect this is likely to be peripheral vertigo secondary to her sinus congestion and less likely to be a central cause.   Fayrene Helper, PA-C 10/14/20 1305    Pollyann Savoy, MD 10/14/20 1355

## 2020-10-14 NOTE — Discharge Instructions (Signed)
You have been evaluated for your symptoms.  Your dizziness is likely due to sinus congestion.  Take meclizine as needed for dizziness.  Follow-up with your doctor for further care.  Return if you have any concern

## 2020-10-14 NOTE — ED Triage Notes (Signed)
Pt presents coming from her PCP after going to them for some memory issues. PCP reports that pt is having some vertiginous episodes with some confusion and memory loss. They are also concerned because of her hx of a PE and would like a CT to ensure that there is no further injury to the brain.

## 2020-10-15 ENCOUNTER — Other Ambulatory Visit: Payer: Self-pay | Admitting: Physical Medicine and Rehabilitation

## 2020-10-28 ENCOUNTER — Other Ambulatory Visit: Payer: Self-pay

## 2020-10-28 ENCOUNTER — Telehealth: Payer: Self-pay | Admitting: Psychiatry

## 2020-10-28 DIAGNOSIS — F411 Generalized anxiety disorder: Secondary | ICD-10-CM

## 2020-10-28 DIAGNOSIS — F3340 Major depressive disorder, recurrent, in remission, unspecified: Secondary | ICD-10-CM

## 2020-10-28 MED ORDER — SERTRALINE HCL 100 MG PO TABS: ORAL_TABLET | ORAL | 0 refills | Status: DC

## 2020-10-28 NOTE — Telephone Encounter (Signed)
Pt requesting refill for Sertraline 100 mg 2/d @ [facility] [place]. Has 1 refill. Pharmacy said, no refills. Apt 6/27.

## 2020-10-28 NOTE — Telephone Encounter (Signed)
Rx sent 

## 2020-10-29 ENCOUNTER — Other Ambulatory Visit: Payer: Self-pay

## 2020-10-29 ENCOUNTER — Telehealth: Payer: Self-pay | Admitting: Physical Medicine and Rehabilitation

## 2020-10-29 ENCOUNTER — Encounter
Payer: PRIVATE HEALTH INSURANCE | Attending: Physical Medicine and Rehabilitation | Admitting: Physical Medicine and Rehabilitation

## 2020-10-29 ENCOUNTER — Encounter: Payer: Self-pay | Admitting: Physical Medicine and Rehabilitation

## 2020-10-29 VITALS — BP 117/83 | HR 94 | Temp 98.8°F | Ht 65.5 in | Wt 152.0 lb

## 2020-10-29 DIAGNOSIS — M7918 Myalgia, other site: Secondary | ICD-10-CM | POA: Diagnosis not present

## 2020-10-29 NOTE — Progress Notes (Signed)
Trigger Point Injection  Indication: Lumbar myofascial pain not relieved by medication management and other conservative care.  Informed consent was obtained after describing risk and benefits of the procedure with the patient, this includes bleeding, bruising, infection and medication side effects.  The patient wishes to proceed and has given written consent.  The patient was placed in a seated position.  The cervical area was marked and prepped with Betadine.  It was entered with a 25-gauge 1/2 inch needle and a total of 5 mL of 1% lidocaine and normal saline was injected into a total of 4 trigger points, after negative draw back for blood.  The patient tolerated the procedure well.  Post procedure instructions were given.  

## 2020-10-29 NOTE — Patient Instructions (Addendum)
Treatments to consider: biowave  Electromagnetic mats  Savella  [place] dose naltrexone     Performed today:  Trigger Point Injection  Indication: Lumbar myofascial pain not relieved by medication management and other conservative care.  Informed consent was obtained after describing risk and benefits of the procedure with the patient, this includes bleeding, bruising, infection and medication side effects.  The patient wishes to proceed and has given written consent.  The patient was placed in a seated position.  The cervical area was marked and prepped with Betadine.  It was entered with a 25-gauge 1/2 inch needle and a total of 5 mL of 1% lidocaine and normal saline was injected into a total of 4 trigger points, after negative draw back for blood.  The patient tolerated the procedure well.  Post procedure instructions were given.

## 2020-10-29 NOTE — Telephone Encounter (Signed)
MD brought ptn to my office questioning large outstanding bill - emailed PB AND HB looks like an undistributed amount that needs to be reviewed- asked billing to contact patient with result

## 2020-11-03 ENCOUNTER — Other Ambulatory Visit: Payer: Self-pay

## 2020-11-03 ENCOUNTER — Ambulatory Visit (HOSPITAL_COMMUNITY)
Admission: RE | Admit: 2020-11-03 | Discharge: 2020-11-03 | Disposition: A | Discharge status: Home / Self Care | Payer: PRIVATE HEALTH INSURANCE | Source: Ambulatory Visit | Attending: Physical Medicine and Rehabilitation | Admitting: Physical Medicine and Rehabilitation

## 2020-11-03 DIAGNOSIS — M25559 Pain in unspecified hip: Secondary | ICD-10-CM

## 2020-11-03 DIAGNOSIS — M461 Sacroiliitis, not elsewhere classified: Secondary | ICD-10-CM | POA: Insufficient documentation

## 2020-11-04 ENCOUNTER — Other Ambulatory Visit: Payer: Self-pay | Admitting: Physical Medicine and Rehabilitation

## 2020-11-04 MED ORDER — BACLOFEN 5 MG PO TABS: 1.0000 | ORAL_TABLET | Freq: Three times a day (TID) | ORAL | 3 refills | Status: DC | PRN

## 2020-11-24 ENCOUNTER — Ambulatory Visit: Payer: PRIVATE HEALTH INSURANCE | Admitting: Physical Medicine and Rehabilitation

## 2020-11-29 ENCOUNTER — Ambulatory Visit (INDEPENDENT_AMBULATORY_CARE_PROVIDER_SITE_OTHER): Payer: No Typology Code available for payment source | Admitting: Psychiatry

## 2020-11-29 ENCOUNTER — Encounter: Payer: Self-pay | Admitting: Psychiatry

## 2020-11-29 ENCOUNTER — Other Ambulatory Visit: Payer: Self-pay

## 2020-11-29 DIAGNOSIS — F3340 Major depressive disorder, recurrent, in remission, unspecified: Secondary | ICD-10-CM | POA: Diagnosis not present

## 2020-11-29 DIAGNOSIS — F410 Panic disorder [episodic paroxysmal anxiety] without agoraphobia: Secondary | ICD-10-CM | POA: Diagnosis not present

## 2020-11-29 DIAGNOSIS — F411 Generalized anxiety disorder: Secondary | ICD-10-CM | POA: Diagnosis not present

## 2020-11-29 MED ORDER — BUSPIRONE HCL 30 MG PO TABS: 30.0000 mg | ORAL_TABLET | Freq: Two times a day (BID) | ORAL | 1 refills | Status: DC

## 2020-11-29 MED ORDER — DAYVIGO 10 MG PO TABS: 10.0000 mg | ORAL_TABLET | Freq: Every day | ORAL | 2 refills | Status: DC

## 2020-11-29 MED ORDER — ALPRAZOLAM 0.5 MG PO TABS: ORAL_TABLET | ORAL | 2 refills | Status: DC

## 2020-11-29 MED ORDER — SERTRALINE HCL 100 MG PO TABS: ORAL_TABLET | ORAL | 0 refills | Status: DC

## 2020-11-29 MED ORDER — PROPRANOLOL HCL 10 MG PO TABS: ORAL_TABLET | ORAL | 1 refills | Status: DC

## 2020-11-29 NOTE — Progress Notes (Signed)
Faith Thomas [id number] [birth year] 60 y.o.  Subjective:   Patient ID:  Faith Thomas is a 59 y.o. (DOB 06/08/1960) female.  Chief Complaint:  Chief Complaint  Patient presents with   Depression   Sleeping Problem   Anxiety    HPI Faith Thomas presents to the office today for follow-up of anxiety, depression, and insomnia.  She reports that with Belsomra she has some difficulty staying asleep. She reports that she sometimes has trouble falling asleep. She thinks that [facility] may have been more effective for her sleep and she was able to fall asleep and stay asleep more easily. Takes 1-2 Xanax at bedtime to help with sleep. She reports that she would like to eventually not need Xanax at bedtime. She reports that she has not taken Melatonin recently.   She reports that she has been reading some books that she finds helpful for insight into anxious thoughts. She reports, "I'm more aware how much anxiety has impacted me." She reports that she has some physiological response to anxiety. She reports that anxiety interferes with things and impacts her professionally. She reports that she has increased anxiety in response to technical issues and she was marked down for reaching out to supervisors about these issues. She reports that she has had less depression and has had occasional depressive episodes that are typically situational. She reports that she will experience increased pain and fatigue after increased activity and this can cause some depression. She reports that energy has improved and is not where she would like for it to be. Motivation varies.  Concentration has improved. Denies SI.   Has been taking CBD gummies and has started CBD gummy that contains Delta 8. She reports that this is helpful for her pain.   Has worked in call center for 9 years.   Working Tuesday- Friday 2pm- midnight.   Past Medication Trials: Buspar-Effective Trintellix- memory  difficulties Sertraline- Effective Cymbalta- memory difficulties Prozac-stomach pain Paxil Celexa Lexapro Remeron- Effective Wellbutrin XL- Irritability Lamictal- ineffective Klonopin Xanax Propranolol Doxepin Trazodone- stopped working [employer]- More effective.  Deplin Lyrica- hypomania Gabapentin- sleep disturbance Amitriptyline-sleeplessness, ineffective.   PHQ2-9    Flowsheet Row Office Visit from 07/09/2020 in [facility] Office Visit from 02/27/2020 in [facility] Office Visit from 04/15/2019 [facility]  PHQ-2 Total Score 0 0 0 6 2      Flowsheet Row ED from 10/14/2020 in [facility]  C-SSRS RISK CATEGORY No Risk        Review of Systems:  Review of Systems  Musculoskeletal:  Negative for gait problem.  Neurological:  Negative for tremors.  Psychiatric/Behavioral:         Please refer to HPI   Medications: I have reviewed the patient's current medications.  Current Outpatient Medications  Medication Sig Dispense Refill   acetaminophen (TYLENOL) 500 MG tablet Take 1,000 mg by mouth every 8 (eight) hours as needed.      apixaban (ELIQUIS) 5 MG TABS tablet Take 1 tablet (5 mg total) by mouth 2 (two) times daily. 180 tablet 1   azelastine (ASTELIN) 0.1 % nasal spray Place into both nostrils 2 (two) times daily. Use in each nostril as directed     Baclofen 5 MG TABS Take 1 tablet by mouth 3 (three) times daily  as needed. 90 tablet 3   CALCIUM PO Take by mouth.     Cholecalciferol (VITAMIN D-3) 1000 units CAPS Take 5,000 Units by mouth daily.      Estradiol 10 MCG TABS vaginal tablet Place 1 tablet vaginally 2 (two) times a week.     fluticasone (FLONASE) 50 MCG/ACT nasal spray Place into  both nostrils daily.     L-Methylfolate-Algae (DEPLIN 15) 15-90.314 MG CAPS TAKE 1 CAPSULE BY MOUTH EVERY DAY 90 capsule 3   Lemborexant (DAYVIGO) 10 MG TABS Take 10 mg by mouth at bedtime. 30 tablet 2   lisinopril (PRINIVIL,ZESTRIL) 10 MG tablet TAKE 1 TABLET BY MOUTH EVERY DAY 34 tablet 2   meclizine (ANTIVERT) 25 MG tablet Take 1 tablet (25 mg total) by mouth 3 (three) times daily as needed for dizziness. 30 tablet 0   methocarbamol (ROBAXIN) 750 MG tablet TAKE 1 TABLET(750 MG) BY MOUTH THREE TIMES DAILY 90 tablet 3   Probiotic Product (PROBIOTIC PO) Take 1 capsule by mouth daily.      sucralfate (CARAFATE) 1 g tablet Take 1 g by mouth 2 (two) times daily as needed.      traMADol (ULTRAM) 50 MG tablet TAKE 1 TABLET BY MOUTH THREE TIMES DAILY AS NEEDED 90 tablet 3   vitamin C (ASCORBIC ACID) 500 MG tablet Take 1,000 mg by mouth daily.      [START ON 12/23/2020] ALPRAZolam (XANAX) 0.5 MG tablet TAKE 1 TAB PO QHS AND 1/2-1 TABLET BY MOUTH TWICE DAILY AS NEEDED FOR ANXIETY 75 tablet 2   busPIRone (BUSPAR) 30 MG tablet Take 1 tablet (30 mg total) by mouth 2 (two) times daily. 180 tablet 1   Melatonin 3 MG CAPS Take by mouth. (Patient not taking: Reported on 11/29/2020)     Multiple Vitamins-Minerals (ZINC PO) Take by mouth. (Patient not taking: Reported on 11/29/2020)     Omega-3 Fatty Acids (OMEGA 3 PO) Take by mouth.     propranolol (INDERAL) 10 MG tablet Take 1-2 tabs po BID prn anxiety 120 tablet 1   sertraline (ZOLOFT) 100 MG tablet TAKE 2 TABLETS(200 MG) BY MOUTH AT BEDTIME 180 tablet 0   tretinoin (RETIN-A) 0.025 % cream APPLY TO AFFECTED SKIN AT BEDTIME (Patient not taking: Reported on 11/29/2020)     valACYclovir (VALTREX) 1000 MG tablet Take 1,000 mg by mouth 3 (three) times daily.  (Patient not taking: Reported on 11/29/2020)     No current facility-administered medications for this visit.    Medication Side Effects: Other: Possible cognitive side effects.  Allergies:  Allergies   Allergen Reactions   Citalopram Tinitus   Escitalopram Tinitus   Bee Venom Swelling   Latex Swelling   Penicillins Swelling and Rash    Has patient had a PCN reaction causing immediate rash, facial/tongue/throat swelling, SOB or lightheadedness with hypotension: Yes Has patient had a PCN reaction causing severe rash involving mucus membranes or skin necrosis: No Has patient had a PCN reaction that required hospitalization: No Has patient had a PCN reaction occurring within the last 10 years: No If all of the above answers are "NO", then may proceed with Cephalosporin use.   Cymbalta [Duloxetine Hcl]     -memory loss   Escitalopram Oxalate Tinitus   Other     Yeast creams- causes secondary infections, itching, burning, redness    Progesterone     Pt feels caused weakness, fatigue, cognitive deficits   Trintellix [Vortioxetine]     Memory problems   Bupropion Other (  See Comments)    weakness and malaise.   Duloxetine Other (See Comments)   Gabapentin Other (See Comments)    Could not sleep.      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   Please see review of systems for further details on the patient's review from today.   Objective:   Physical Exam:  BP 126/84   Pulse 75   Physical Exam Constitutional:      General: She is not in acute distress. Musculoskeletal:        General: No deformity.  Neurological:     Mental Status: She is alert and oriented to person, place, and time.     Coordination:  Coordination normal.  Psychiatric:        Attention and Perception: Attention and perception normal. She does not perceive auditory or visual hallucinations.        Mood and Affect: Mood is anxious. Mood is not depressed. Affect is not labile, blunt, angry or inappropriate.        Speech: Speech normal.        Behavior: Behavior normal.        Thought Content: Thought content normal. Thought content is not paranoid or delusional. Thought content does not include homicidal or suicidal ideation. Thought content does not include homicidal or suicidal plan.        Cognition and Memory: Cognition and memory normal.        Judgment: Judgment normal.     Comments: Insight intact    Lab Review:     Component Value Date/Time   NA 135 10/14/2020 1038   NA 134 (L) 01/05/2017 1359   K 3.8 10/14/2020 1038   K 3.9 01/05/2017 1359   CL 100 10/14/2020 1038   CO2 31 10/14/2020 1038   CO2 25 01/05/2017 1359   GLUCOSE 96 10/14/2020 1038   GLUCOSE 106 01/05/2017 1359   BUN 18 10/14/2020 1038   BUN 10.2 01/05/2017 1359   CREATININE 0.86 10/14/2020 1038   CREATININE 0.8 01/05/2017 1359   CALCIUM 9.4 10/14/2020 1038   CALCIUM 9.5 01/05/2017 1359   PROT 6.4 01/05/2017 1359   ALBUMIN 4.2 01/05/2017 1359   AST 18 01/05/2017 1359   ALT 15 01/05/2017 1359   ALKPHOS 60 01/05/2017 1359   BILITOT 0.22 01/05/2017 1359   GFRNONAA >60 10/14/2020 1038   GFRAA >60 12/04/2016 1548       Component Value Date/Time   WBC 5.1 10/14/2020 1038   RBC 4.33 10/14/2020 1038   HGB 14.4 10/14/2020 1038   HGB 13.4 01/05/2017 1359   HCT 43.5 10/14/2020 1038   HCT 39.0 01/05/2017 1359   PLT 300 10/14/2020 1038   PLT 274 01/05/2017 1359   MCV 100.5 (H) 10/14/2020 1038   MCV 97.5 01/05/2017 1359   MCH 33.3 10/14/2020 1038   MCHC 33.1 10/14/2020 1038   RDW 12.6 10/14/2020 1038   RDW  12.4 01/05/2017 1359   LYMPHSABS 2.1 10/14/2020 1038   LYMPHSABS 3.0 01/05/2017 1359   MONOABS 0.5 10/14/2020 1038   MONOABS 0.6  01/05/2017 1359   EOSABS 0.1 10/14/2020 1038   EOSABS 0.1 01/05/2017 1359   BASOSABS 0.0 10/14/2020 1038   BASOSABS 0.0 01/05/2017 1359    No results found for: POCLITH, LITHIUM   No results found for: PHENYTOIN, PHENOBARB, VALPROATE, CBMZ   .res Assessment: Plan:   Pt seen for 30 minutes and time spent counseling pt re: treatment options for insomnia and anxiety.  Discussed re-starting Propranolol to be used as needed for anxiety. Discussed that Propranolol could also be taken prior to work if this is when most of her anxiety occurs.  Will switch Belsomra to [facility] since she reports that this was more effective for her insomnia.  Asks about change in medication to an SNRI to possibly improve pain. Reports that she will discuss with medical provider since she has not tolerated Cymbalta in the past.  Will continue all other medications as prescribed.  Pt to follow-up in 6 weeks or sooner if clinically indicated.  Patient advised to contact office with any questions, adverse effects, or acute worsening in signs and symptoms.   Faith Thomas was seen today for depression, sleeping problem and anxiety.  Diagnoses and all orders for this visit:  Generalized anxiety disorder -     propranolol (INDERAL) 10 MG tablet; Take 1-2 tabs po BID prn anxiety -     ALPRAZolam (XANAX) 0.5 MG tablet; TAKE 1 TAB PO QHS AND 1/2-1 TABLET BY MOUTH TWICE DAILY AS NEEDED FOR ANXIETY -     busPIRone (BUSPAR) 30 MG tablet; Take 1 tablet (30 mg total) by mouth 2 (two) times daily. -     sertraline (ZOLOFT) 100 MG tablet; TAKE 2 TABLETS(200 MG) BY MOUTH AT BEDTIME  Panic disorder -     propranolol (INDERAL) 10 MG tablet; Take 1-2 tabs po BID prn anxiety  MDD (recurrent major depressive disorder) -     sertraline (ZOLOFT) 100 MG tablet; TAKE 2 TABLETS(200 MG) BY MOUTH AT BEDTIME  Other orders -     Lemborexant (DAYVIGO) 10 MG TABS; Take 10 mg by mouth at bedtime.    Please see After Visit Summary for patient  specific instructions.  Future Appointments  Date Time Provider Department Center  11/30/2020 11:00 AM Raulkar, Drema Pry, MD [facility]  12/21/2020 10:40 AM Carlis Abbott, Drema Pry, MD [facility]  01/11/2021 10:00 AM Corie Chiquito, PMHNP [facility] None  02/03/2021 10:20 AM Raulkar, Drema Pry, MD [facility]    No orders of the defined types were placed in this encounter.   -------------------------------

## 2020-11-30 ENCOUNTER — Other Ambulatory Visit: Payer: Self-pay

## 2020-11-30 ENCOUNTER — Encounter
Payer: PRIVATE HEALTH INSURANCE | Attending: Physical Medicine and Rehabilitation | Admitting: Physical Medicine and Rehabilitation

## 2020-11-30 VITALS — BP 128/88 | HR 76 | Temp 98.6°F | Ht 65.5 in | Wt 155.2 lb

## 2020-11-30 DIAGNOSIS — G894 Chronic pain syndrome: Secondary | ICD-10-CM | POA: Diagnosis present

## 2020-11-30 DIAGNOSIS — M5459 Other low back pain: Secondary | ICD-10-CM

## 2020-11-30 DIAGNOSIS — Z79891 Long term (current) use of opiate analgesic: Secondary | ICD-10-CM | POA: Diagnosis present

## 2020-11-30 DIAGNOSIS — Z5181 Encounter for therapeutic drug level monitoring: Secondary | ICD-10-CM | POA: Insufficient documentation

## 2020-11-30 NOTE — Progress Notes (Signed)
Trigger Point Injection  Indication: Lumbar myofascial pain not relieved by medication management and other conservative care.  Informed consent was obtained after describing risk and benefits of the procedure with the patient, this includes bleeding, bruising, infection and medication side effects.  The patient wishes to proceed and has given written consent.  The patient was placed in a seated position.  The cervical area was marked and prepped with Betadine.  It was entered with a 25-gauge 1/2 inch needle and a total of 5 mL of 1% lidocaine was injected into a total of 4 trigger points, after negative draw back for blood.  The patient tolerated the procedure well.  Post procedure instructions were given.

## 2020-12-08 LAB — TOXASSURE SELECT,+ANTIDEPR,UR

## 2020-12-09 ENCOUNTER — Telehealth: Payer: Self-pay | Admitting: Psychiatry

## 2020-12-09 DIAGNOSIS — F5101 Primary insomnia: Secondary | ICD-10-CM

## 2020-12-09 DIAGNOSIS — F3340 Major depressive disorder, recurrent, in remission, unspecified: Secondary | ICD-10-CM

## 2020-12-09 MED ORDER — MIRTAZAPINE 15 MG PO TABS: ORAL_TABLET | ORAL | 1 refills | Status: DC

## 2020-12-09 NOTE — Telephone Encounter (Signed)
Pt called in regarding her prescription for Mirtazpine 15mg . [facility] pharmacy has told her that they have sent over orders and haven't heard back. She is unsure if she was supposed to stop taking the Mirtazpine or not and if that is the reason it hasn't been filled. She would like a RTC ASAP [phone].

## 2020-12-09 NOTE — Telephone Encounter (Signed)
Ok. Refill sent. 

## 2020-12-09 NOTE — Telephone Encounter (Signed)
Pt stated there may have been a mistake.The rx must have had refills because she has not stopped taking it and picking up Rx.She will just need a new rx sent to [facility] on [address]

## 2020-12-09 NOTE — Telephone Encounter (Signed)
Note from 6/27 did not mention mirtazapine,should she be taking this?They may have faxed a refill request but pt is making sure she is still suppose to be on this med.

## 2020-12-09 NOTE — Telephone Encounter (Signed)
It looks like it was removed from her med list on 07/09/20 due to "Patient Preference." Has she continued to take it? We can continue it if this was an error or can re-start it. It was initially very helpful for her mood and sleep.

## 2020-12-21 ENCOUNTER — Encounter
Payer: PRIVATE HEALTH INSURANCE | Attending: Physical Medicine and Rehabilitation | Admitting: Physical Medicine and Rehabilitation

## 2020-12-21 ENCOUNTER — Other Ambulatory Visit: Payer: Self-pay

## 2020-12-21 VITALS — BP 91/68 | HR 81 | Temp 98.2°F | Ht 65.0 in | Wt 150.8 lb

## 2020-12-21 DIAGNOSIS — M47817 Spondylosis without myelopathy or radiculopathy, lumbosacral region: Secondary | ICD-10-CM | POA: Diagnosis not present

## 2020-12-21 NOTE — Progress Notes (Signed)
Subjective:    Patient ID: Faith Thomas, female    DOB: [birth year], 60 y.o.   MRN: [id number]  HPI  Faith Thomas is a 60 year old woman who presents for follow-up of her fibromyalgia, facet arthropathy, myofascial pain, and lumbar spinal stenosis.   She received her radiofrequency nerve ablation from Dr. Wynn Banker on 7/22 of her left L5 dorsal ramus and left L3 and L4 medial branches. She had good benefit from this. We had discussed that this would help her back pain but not her leg pain and now she would be interested in addressing her leg pain.   She is interested in scheduling ESI. She is on Eliquis (prescribed by her PCP, she does not see a cardiologist). She will stop three days prior to procedure. She will have driver day of procedure.  She also has a painful thoracic paraspinal spasm on left side. She would be interested in trigger points for this. This radiates down to her lumbar spine and she is not sure if it is connected to her lower back pain.   She has a There Cane and uses a soft foam roller and rolls on that regularly. She has a left sided thoracic parasponal muscle spasm. Ice helps decrease the pain.  She would like to be able to have less pain in order to do more activities with her 58 year old granddaughter.    She is doing Noom, a weight loss app based on psychology. Being aware of emoitional eating, lost a little bit of weight. Her goal is to get down to 135. Height is 5'5."   She has been using ginger a fair amount. It caused a lot of gastric issues.   She has had very good benefit from trigger point injections.   She has been doing meditation  She is ready to try Qutenza for her upper, mid, lower back, buttock, right hip, and right calf.   She has lost 5 lbs in the past month!  Pain Inventory Average Pain 4 Pain Right Now 5 My pain is sharp, burning, dull, stabbing, tingling and aching  In the last 24 hours, has pain interfered with the  following? General activity 4 Relation with others 3 Enjoyment of life 4 What TIME of day is your pain at its worst? varies Sleep (in general) Fair  Pain is worse with: walking, bending, inactivity and standing Pain improves with: rest, heat/ice, therapy/exercise, medication and injections Relief from Meds: 6  Family History  Problem Relation Age of Onset   Arthritis Mother    Hypertension Mother    Mental illness Mother    Anxiety disorder Mother    Depression Mother    Arthritis Father    Hypertension Father    Mental illness Father    Anxiety disorder Father    Depression Father    Mental illness Brother    ADD / ADHD Brother    Psychosis Brother    Post-traumatic stress disorder Brother    Arthritis Daughter    Autism Daughter    Cancer Maternal Aunt    Autism Maternal Aunt    Hypertension Maternal Grandmother    Arthritis Maternal Grandfather    Depression Cousin    Anxiety disorder Cousin    Depression Maternal Aunt    Social History   Socioeconomic History   Marital status: Divorced    Spouse name: Not on file   Number of children: Not on file   Years of education: Not on file  Highest education level: Not on file  Occupational History   Not on file  Tobacco Use   Smoking status: Never   Smokeless tobacco: Never  Vaping Use   Vaping Use: Never used  Substance and Sexual Activity   Alcohol use: Yes    Alcohol/week: 0.0 standard drinks    Comment: occasional    Drug use: No   Sexual activity: Not on file  Other Topics Concern   Not on file  Social History Narrative   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 No past surgical history on  file. 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   Hypertension    Osteoporosis    PE (pulmonary thromboembolism) (HCC)    Positive TB test    Raynaud disease    Scoliosis    Urine incontinence    BP 91/68 (BP Location: Right Arm, Patient Position: Sitting)   Pulse 81   Temp 98.2 F (36.8 C) (Oral)   Ht 5\' 5"  (1.651 m)   Wt 150 lb 12.8 oz (68.4 kg)   SpO2 96%   BMI 25.09 kg/m   Opioid Risk Score:   Fall Risk Score:  `1  Depression screen PHQ 2/9  Dep[facility] screen [facility] 07/09/2020 02/27/2020 04/15/2019 01/07/2018 09/06/2017  Decreased Interest 0 0 0 3 0  Down, Depressed, Hopeless 0 0 0 3 2  PHQ - 2 Score 0 0 0 6 2    Review of Systems  Constitutional: Negative.   HENT: Negative.    Eyes: Negative.   Respiratory: Negative.    Cardiovascular: Negative.   Gastrointestinal: Negative.   Endocrine: Negative.   Genitourinary: Negative.   Musculoskeletal: Negative.   Skin: Negative.   Allergic/Immunologic: Negative.   Neurological: Negative.   Hematological: Negative.   Psychiatric/Behavioral: Negative.    All other systems reviewed and are negative.     Objective:   Physical Exam Gen: no distress, normal appearing HEENT: oral mucosa pink and moist, NCAT Cardio: Reg rate Chest: normal effort, normal rate of breathing Abd: soft, non-distended Ext: no edema Skin: intact Neuro: Alert and oriented x3 Musculoskeletal: + slump test on the left for pain radiating down leg in L5 and S1 nerve root distribution. Full and painless flexion and extension. Left sided thoracic paraspinal trigger points. Erythema in right upper back from Qutenza- not at other sites Psych: pleasant, normal affect    Assessment & Plan:  1) Lumbar spondylosis with neurogenic claudication, left  side 2) Bilateral facet joint arthritis, lumbar 3) left sided thoracic paraspinal spasm   -Faith Thomas's chief complaint today is lower back pain with left sided sciatica in the L5 and S1 distribution. On physical exam, she has painless FROM on flexion with her bilateral low back pain. On last visit this was reproduced with extension but currently she has full and painless extension. Her pain has been most consistent with left L5 and S1  nerve root impingement secondary to stenosis and lower back pain secondary to facet arthropathy, which is seen on her most recent MRI, which shows worsening facet arthropathy as well as left sided annular fissure; results reviewed and discussed with patient previously. She would benefit and is agreeable to a course of physical therapy focused on core strengthening. She would like to hold off on starting this until COVID-19 cases have reduced. For now, she will continue her daily yoga practice (she performed 2 hours today and does so regularly), which has been shown to help low back pain and which helps her.   -She is interested in left thoracic paraspinal trigger point injections- will schedule at next available appointment with me.   -She had good benefit from radiofrequency nerve ablation with Dr. Wynn Banker of left L5 dorsal ramus and left L3 and L4 medial branches. We educated her that this would not help with her leg pain and now she would like to address this. Scheduled for ESI with Dr. Wynn Banker of left L5 and S1. She will hold her Eliquis for three days prior. She will have driver.    -Will refill Robaxin to 750mg  TID and Tramadol 50mg  TID PRN which are working for her and which she is tolerating without side effects, on an as needed basis.   -Discussed Qutenza as an option for neuropathic pain control. Discussed that this is a capsaicin patch, stronger than capsaicin cream. Discussed that it is currently approved for diabetic peripheral neuropathy and  post-herpetic neuralgia, but that it has also shown benefit in treating other forms of neuropathy. Provided patient with link to site to learn more about the patch: . Discussed that the patch would be placed in office and benefits usually last 3 months. Discussed that unintended exposure to capsaicin can cause severe irritation of eyes, mucous membranes, respiratory tract, and skin, but that Qutenza is a local treatment and does not have the systemic side effects of other nerve medications. Discussed that there may be pain, itching, erythema, and decreased sensory function associated with the application of Qutenza. Side effects usually subside within 1 week. A cold pack of analgesic medications can help with these side effects. Blood pressure can also be increased due to pain associated with administration of the patch.   4 patches of Qutenza was applied to the area of pain. Ice packs were applied during the procedure to ensure patient comfort. Blood pressure was monitored every 15 minutes. The patient tolerated the procedure well for 1 hour. Post-procedure instructions were given and follow-up has been scheduled.     4) Fibromyalgia -Patient's symptoms have greatly improved with her change in job, allowing her to sleep better. Continue daily yoga and meditation for stress relief. Discussed the goal of increasing her aerobic exercise to 15 minutes of daily outdoor walking for now.    5) Osteopenia: Counseled regarding supplements, diet, and exercise to improve bone density. She is also following with rheumatology and on Fosfomax, as well as with a dietician. She has been following diet plan well. Advised regarding risk of corticosteroids when osteopenic.    6) Hypotension: BP better controlled today at 123/82. Advised that she log her BP daily and bring reads to follow-up appointment with me or PCP so medication can be appropriately adjusted.   7) General health: provided dietary  counseling.   --Follow up in 1 months for continued management of above conditions.    40 minutes spent in application of Qutenza, discussion of pain, discussion of risks  and benfits of procedure, checking on patient during procedure and applyin

## 2021-01-10 ENCOUNTER — Encounter: Payer: Self-pay | Admitting: Psychiatry

## 2021-01-10 ENCOUNTER — Ambulatory Visit (INDEPENDENT_AMBULATORY_CARE_PROVIDER_SITE_OTHER): Payer: No Typology Code available for payment source | Admitting: Psychiatry

## 2021-01-10 ENCOUNTER — Other Ambulatory Visit: Payer: Self-pay

## 2021-01-10 DIAGNOSIS — F411 Generalized anxiety disorder: Secondary | ICD-10-CM | POA: Diagnosis not present

## 2021-01-10 DIAGNOSIS — F5101 Primary insomnia: Secondary | ICD-10-CM

## 2021-01-10 DIAGNOSIS — Z1589 Genetic susceptibility to other disease: Secondary | ICD-10-CM

## 2021-01-10 DIAGNOSIS — F410 Panic disorder [episodic paroxysmal anxiety] without agoraphobia: Secondary | ICD-10-CM

## 2021-01-10 DIAGNOSIS — F3340 Major depressive disorder, recurrent, in remission, unspecified: Secondary | ICD-10-CM | POA: Diagnosis not present

## 2021-01-10 MED ORDER — SERTRALINE HCL 100 MG PO TABS: ORAL_TABLET | ORAL | 1 refills | Status: DC

## 2021-01-10 MED ORDER — MIRTAZAPINE 15 MG PO TABS: ORAL_TABLET | ORAL | 3 refills | Status: DC

## 2021-01-10 MED ORDER — PROPRANOLOL HCL 10 MG PO TABS: ORAL_TABLET | ORAL | 1 refills | Status: DC

## 2021-01-10 MED ORDER — DAYVIGO 10 MG PO TABS: 10.0000 mg | ORAL_TABLET | Freq: Every day | ORAL | 5 refills | Status: DC

## 2021-01-10 MED ORDER — DEPLIN 15 15-90.314 MG PO CAPS: 1.0000 | ORAL_CAPSULE | Freq: Every day | ORAL | 3 refills | Status: DC

## 2021-01-10 NOTE — Progress Notes (Signed)
Faith BadeMary Anna Ariel [id number] Apr 24, 1961 60 y.o.  Subjective:   Patient ID:  Faith Thomas is a 60 y.o. (DOB Apr 24, 1961) female.  Chief Complaint:  Chief Complaint  Patient presents with   Anxiety    Anxiety    Faith BadeMary Anna Mandile presents to the office today for follow-up of anxiety, insomnia, and depression. She reports that she has been "up and down." She reports that she applied to a position and reports that she did not interview well due to anxiety. She reports that she has anxiety in response to work. She reports that is working with her therapist to identify how her anxiety affects her work life and vice versa. She reports occasional panic. She notices some hyper-vigilance. Reports that her mood has fluctuated some. She reports that she has had one day where mood was more depressed than it had been in awhile and "the next day I felt significantly better." She notices some irritability, typically with increased pain and hunger. Sleeping better with Dayvigo. Does not sleep as soundly when granddaughter is present. Has difficulty sleeping when she has to get up early, even before a leisure activity. She reports that her appetite is higher than she would like and cholesterol is elevated. Energy has been ok. Motivation has improved some. She notices how anxiety and rumination interfere with concentration. She notices concentration is better when she does meditation and mindfulness. Denies SI.   She reports that if she takes Propranolol early, it will be helpful. She reports that Propranolol is not very effective if she takes it after anxiety is already elevated.   Taking Xanax nightly.   Has been working with therapist.   Loura HaltGranddaughter is 783 yo. Daughter and granddaughter live with her currently. Works 10 hour shifts.   Past Medication Trials: Buspar-Effective Trintellix- memory difficulties Sertraline- Effective Cymbalta- memory difficulties Prozac-stomach  pain Paxil Celexa Lexapro Remeron- Effective Wellbutrin XL- Irritability Lamictal- ineffective Klonopin Xanax Propranolol Doxepin Trazodone- stopped working CiscoDayvigo Belsomra- More effective.  Deplin Lyrica- hypomania Gabapentin- sleep disturbance Amitriptyline-sleeplessness, ineffective.   PHQ2-9    Flowsheet Row [facility]  PHQ-2 Total Score 0 0 0 6 2      Flowsheet Row ED from 10/14/2020 in [facility]  C-SSRS RISK CATEGORY No Risk        Review of Systems:  Review of Systems  Musculoskeletal:  Negative for gait problem.  Neurological:        Reports less dizziness and light-headedness  Psychiatric/Behavioral:         Please refer to HPI   Medications: I have reviewed the patient's current medications.  Current Outpatient Medications  Medication Sig Dispense Refill   ALPRAZolam (XANAX) 0.5 MG tablet TAKE 1 TAB PO QHS AND 1/2-1 TABLET BY MOUTH TWICE DAILY AS NEEDED FOR ANXIETY 75 tablet 2   apixaban (ELIQUIS) 5 MG TABS tablet Take 1 tablet (5 mg total) by mouth 2 (two) times daily. 180 tablet 1   Baclofen 5 MG TABS Take 1 tablet by mouth 3 (three) times daily as needed. 90 tablet 3   busPIRone (BUSPAR) 30 MG tablet Take 1 tablet (30 mg total) by mouth 2 (two) times daily. 180 tablet 1  CALCIUM PO Take by mouth.     Cholecalciferol (VITAMIN D-3) 1000 units CAPS Take 5,000 Units by mouth daily.      Estradiol 10 MCG TABS vaginal tablet Place 1 tablet vaginally 2 (two) times a week.     fluticasone (FLONASE) 50 MCG/ACT nasal spray Place into both nostrils daily as needed.      lisinopril (PRINIVIL,ZESTRIL) 10 MG tablet TAKE 1 TABLET BY MOUTH EVERY DAY 34 tablet 2   Melatonin 3 MG CAPS Take by mouth.     methocarbamol (ROBAXIN) 750 MG tablet TAKE 1 TABLET(750 MG) BY MOUTH THREE TIMES DAILY 90 tablet 3   Multiple Vitamins-Minerals (ZINC PO) Take by mouth.     Omega-3 Fatty Acids (OMEGA 3 PO) Take by mouth.     Probiotic Product (PROBIOTIC PO) Take 1 capsule by mouth daily.      traMADol (ULTRAM) 50 MG tablet TAKE 1 TABLET BY MOUTH THREE TIMES DAILY AS NEEDED 90 tablet 3   vitamin C (ASCORBIC ACID) 500 MG tablet Take 1,000 mg by mouth daily.      acetaminophen (TYLENOL) 500 MG tablet Take 1,000 mg by mouth every 8 (eight) hours as needed.      azelastine (ASTELIN) 0.1 % nasal spray Place into both nostrils 2 (two) times daily. Use in each nostril as directed (Patient not taking: Reported on 01/10/2021)     L-Methylfolate-Algae (DEPLIN 15) 15-90.314 MG CAPS Take 1 tablet by mouth daily. 90 capsule 3   [START ON 01/25/2021] Lemborexant (DAYVIGO) 10 MG TABS Take 10 mg by mouth at bedtime. 30 tablet 5   meclizine (ANTIVERT) 25 MG tablet Take 1 tablet (25 mg total) by mouth 3 (three) times daily as needed for dizziness. 30 tablet 0   mirtazapine (REMERON) 15 MG tablet TAKE 1 TABLET(15 MG) BY MOUTH AT BEDTIME 90 tablet 3   propranolol (INDERAL) 10 MG tablet Take 1-2 tabs po BID prn anxiety 120 tablet 1   sertraline (ZOLOFT) 100 MG tablet TAKE 2 TABLETS(200 MG) BY MOUTH AT BEDTIME 180 tablet 1   sucralfate (CARAFATE) 1 g tablet Take 1 g by mouth 2 (two) times daily as needed.  (Patient not taking: Reported on 01/10/2021)     tretinoin (RETIN-A) 0.025 % cream      valACYclovir (VALTREX) 1000 MG tablet Take 1,000 mg by mouth 3 (three) times daily. (Patient not taking: Reported on 01/10/2021)     No current facility-administered medications for this visit.    Medication Side Effects: None  Allergies:  Allergies  Allergen Reactions   Citalopram Tinitus   Escitalopram Tinitus    Bee Venom Swelling   Latex Swelling   Penicillins Swelling and Rash    Has patient had a PCN reaction causing immediate rash, facial/tongue/throat swelling, SOB or lightheadedness with hypotension: Yes Has patient had a PCN reaction causing severe rash involving mucus membranes or skin necrosis: No Has patient had a PCN reaction that required hospitalization: No Has patient had a PCN reaction occurring within the last 10 years: No If all of the above answers are "NO", then may proceed with Cephalosporin use.   Cymbalta [Duloxetine Hcl]     -memory loss   Escitalopram Oxalate Tinitus   Other     Yeast creams- causes secondary infections, itching, burning, redness    Progesterone     Pt feels caused weakness, fatigue, cognitive deficits   Trintellix [Vortioxetine]     Memory problems   Bupropion Other (See Comments)    weakness and  malaise.   Duloxetine Other (See Comments)   Gabapentin Other (See Comments)    Could not sleep.      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   Please see review of systems for further details on the patient's review from today.   Objective:   Physical Exam:  BP 120/73   Pulse 77   Physical Exam Constitutional:      General: She is not in acute distress. Musculoskeletal:        General: No deformity.  Neurological:     Mental Status: She is alert and oriented to person, place, and time.     Coordination: Coordination normal.  Psychiatric:        Attention and Perception:  Attention and perception normal. She does not perceive auditory or visual hallucinations.        Mood and Affect: Mood is anxious. Mood is not depressed. Affect is not labile, blunt, angry or inappropriate.        Speech: Speech normal.        Behavior: Behavior normal.        Thought Content: Thought content normal. Thought content is not paranoid or delusional. Thought content does not include homicidal or suicidal ideation. Thought content does not include homicidal or suicidal plan.        Cognition and Memory: Cognition and memory normal.        Judgment: Judgment normal.     Comments: Insight intact    Lab Review:     Component Value Date/Time   NA 135 10/14/2020 1038   NA 134 (L) 01/05/2017 1359   K 3.8 10/14/2020 1038   K 3.9 01/05/2017 1359   CL 100 10/14/2020 1038   CO2 31 10/14/2020 1038   CO2 25 01/05/2017 1359   GLUCOSE 96 10/14/2020 1038   GLUCOSE 106 01/05/2017 1359   BUN 18 10/14/2020 1038   BUN 10.2 01/05/2017 1359   CREATININE 0.86 10/14/2020 1038   CREATININE 0.8 01/05/2017 1359   CALCIUM 9.4 10/14/2020 1038   CALCIUM 9.5 01/05/2017 1359   PROT 6.4 01/05/2017 1359   ALBUMIN 4.2 01/05/2017 1359   AST 18 01/05/2017 1359   ALT 15 01/05/2017 1359   ALKPHOS 60 01/05/2017 1359   BILITOT 0.22 01/05/2017 1359   GFRNONAA >60 10/14/2020 1038   GFRAA >60 12/04/2016 1548       Component Value Date/Time   WBC 5.1 10/14/2020 1038   RBC 4.33 10/14/2020 1038   HGB 14.4 10/14/2020 1038   HGB 13.4 01/05/2017 1359   HCT 43.5 10/14/2020 1038   HCT 39.0 01/05/2017 1359   PLT 300 10/14/2020 1038   PLT 274 01/05/2017 1359   MCV 100.5 (H) 10/14/2020 1038   MCV 97.5 01/05/2017 1359   MCH 33.3 10/14/2020 1038   MCHC 33.1 10/14/2020 1038   RDW 12.6 10/14/2020 1038   RDW 12.4 01/05/2017 1359   LYMPHSABS 2.1  10/14/2020 1038   LYMPHSABS 3.0 01/05/2017 1359   MONOABS 0.5 10/14/2020 1038   MONOABS 0.6 01/05/2017 1359   EOSABS 0.1 10/14/2020 1038   EOSABS 0.1 01/05/2017  1359   BASOSABS 0.0 10/14/2020 1038   BASOSABS 0.0 01/05/2017 1359    No results found for: POCLITH, LITHIUM   No results found for: PHENYTOIN, PHENOBARB, VALPROATE, CBMZ   .res Assessment: Plan:    Patient seen for 30 minutes and time spent counseling the patient regarding possible treatment options, to include Savella which her medical provider had mentioned as a possible treatment option for her pain.  Discussed that Amada Jupiter is an SNRI and sertraline would need to be decreased and discontinued with initiation of Savella.  Discussed that some SNRIs are not as effective for management of anxiety signs and symptoms compared to SSRIs and it is possible that Savella may not be as effective for her anxiety compared to sertraline.  She reports that she may wish to consider trial of Savella in the future and prefers to continue exploring other treatment options for pain management at this time. Also discussed her concern about continued increased appetite.  Discussed that mirtazapine can increase appetite and contribute to weight gain.  She reports that she would prefer not to decrease mirtazapine currently since her sleep has recently improved and she is concerned about possible worsening mood or anxiety signs and symptoms and prefers to not have risk of worsening signs and symptoms at this time in the context of other stressors.  We will continue mirtazapine 15 mg at bedtime for mood, anxiety, and insomnia. Continue sertraline 200 mg daily for anxiety and depression. Continue Deplin for augmentation of depression. Continue Dayvigo 10 mg at bedtime for insomnia. Continue alprazolam as needed for anxiety. Continue propanolol as needed for anxiety. Recommend continuing psychotherapy Patient to follow-up in 3 months or sooner if clinically indicated. Patient advised to contact office with any questions, adverse effects, or acute worsening in signs and symptoms.    Charee was seen today for  anxiety.  Diagnoses and all orders for this visit:  MDD (recurrent major depressive disorder) -     L-Methylfolate-Algae (DEPLIN 15) 15-90.314 MG CAPS; Take 1 tablet by mouth daily. -     mirtazapine (REMERON) 15 MG tablet; TAKE 1 TABLET(15 MG) BY MOUTH AT BEDTIME -     sertraline (ZOLOFT) 100 MG tablet; TAKE 2 TABLETS(200 MG) BY MOUTH AT BEDTIME  Homozygous for MTHFR gene mutation -     L-Methylfolate-Algae (DEPLIN 15) 15-90.314 MG CAPS; Take 1 tablet by mouth daily.  Primary insomnia -     Lemborexant (DAYVIGO) 10 MG TABS; Take 10 mg by mouth at bedtime. -     mirtazapine (REMERON) 15 MG tablet; TAKE 1 TABLET(15 MG) BY MOUTH AT BEDTIME  Generalized anxiety disorder -     sertraline (ZOLOFT) 100 MG tablet; TAKE 2 TABLETS(200 MG) BY MOUTH AT BEDTIME -     propranolol (INDERAL) 10 MG tablet; Take 1-2 tabs po BID prn anxiety  Panic disorder -     propranolol (INDERAL) 10 MG tablet; Take 1-2 tabs po BID prn anxiety    Please see After Visit Summary for patient specific instructions.  Future Appointments  Date Time Provider Department Center  02/03/2021 10:20 AM Raulkar, Drema Pry, MD [facility]  04/07/2021 10:40 AM Carlis Abbott, Drema Pry, MD [facility]  04/11/2021 11:00 AM Corie Chiquito, PMHNP [facility] None    No orders of the defined types were placed in this  encounter.   -------------------------------

## 2021-01-11 ENCOUNTER — Ambulatory Visit: Payer: PRIVATE HEALTH INSURANCE | Admitting: Psychiatry

## 2021-01-15 ENCOUNTER — Emergency Department (HOSPITAL_COMMUNITY): Payer: PRIVATE HEALTH INSURANCE

## 2021-01-15 ENCOUNTER — Other Ambulatory Visit: Payer: Self-pay

## 2021-01-15 ENCOUNTER — Encounter (HOSPITAL_COMMUNITY): Payer: Self-pay | Admitting: Emergency Medicine

## 2021-01-15 ENCOUNTER — Emergency Department (HOSPITAL_COMMUNITY)
Admission: EM | Admit: 2021-01-15 | Discharge: 2021-01-15 | Disposition: A | Discharge status: Home / Self Care | Payer: PRIVATE HEALTH INSURANCE | Attending: Emergency Medicine | Admitting: Emergency Medicine

## 2021-01-15 DIAGNOSIS — Z7952 Long term (current) use of systemic steroids: Secondary | ICD-10-CM | POA: Insufficient documentation

## 2021-01-15 DIAGNOSIS — Z79899 Other long term (current) drug therapy: Secondary | ICD-10-CM | POA: Diagnosis not present

## 2021-01-15 DIAGNOSIS — M79662 Pain in left lower leg: Secondary | ICD-10-CM | POA: Diagnosis not present

## 2021-01-15 DIAGNOSIS — W2103XA Struck by baseball, initial encounter: Secondary | ICD-10-CM | POA: Insufficient documentation

## 2021-01-15 DIAGNOSIS — S8992XA Unspecified injury of left lower leg, initial encounter: Secondary | ICD-10-CM | POA: Diagnosis present

## 2021-01-15 DIAGNOSIS — Z7901 Long term (current) use of anticoagulants: Secondary | ICD-10-CM | POA: Diagnosis not present

## 2021-01-15 DIAGNOSIS — Z91040 Latex allergy status: Secondary | ICD-10-CM | POA: Diagnosis not present

## 2021-01-15 DIAGNOSIS — S8012XA Contusion of left lower leg, initial encounter: Secondary | ICD-10-CM | POA: Insufficient documentation

## 2021-01-15 DIAGNOSIS — I10 Essential (primary) hypertension: Secondary | ICD-10-CM | POA: Insufficient documentation

## 2021-01-15 DIAGNOSIS — Y9364 Activity, baseball: Secondary | ICD-10-CM | POA: Diagnosis not present

## 2021-01-15 DIAGNOSIS — T148XXA Other injury of unspecified body region, initial encounter: Secondary | ICD-10-CM

## 2021-01-15 DIAGNOSIS — S0502XA Injury of conjunctiva and corneal abrasion without foreign body, left eye, initial encounter: Secondary | ICD-10-CM | POA: Insufficient documentation

## 2021-01-15 LAB — CBC WITH DIFFERENTIAL/PLATELET
Abs Immature Granulocytes: 0.01 10*3/uL (ref 0.00–0.07)
Basophils Absolute: 0 10*3/uL (ref 0.0–0.1)
Basophils Relative: 1 %
Eosinophils Absolute: 0.1 10*3/uL (ref 0.0–0.5)
Eosinophils Relative: 2 %
HCT: 40.2 % (ref 36.0–46.0)
Hemoglobin: 13.9 g/dL (ref 12.0–15.0)
Immature Granulocytes: 0 %
Lymphocytes Relative: 43 %
Lymphs Abs: 2.2 10*3/uL (ref 0.7–4.0)
MCH: 32.9 pg (ref 26.0–34.0)
MCHC: 34.6 g/dL (ref 30.0–36.0)
MCV: 95.3 fL (ref 80.0–100.0)
Monocytes Absolute: 0.4 10*3/uL (ref 0.1–1.0)
Monocytes Relative: 9 %
Neutro Abs: 2.3 10*3/uL (ref 1.7–7.7)
Neutrophils Relative %: 45 %
Platelets: 277 10*3/uL (ref 150–400)
RBC: 4.22 MIL/uL (ref 3.87–5.11)
RDW: 12.5 % (ref 11.5–15.5)
WBC: 5.1 10*3/uL (ref 4.0–10.5)
nRBC: 0 % (ref 0.0–0.2)

## 2021-01-15 LAB — COMPREHENSIVE METABOLIC PANEL
ALT: 24 U/L (ref 0–44)
AST: 25 U/L (ref 15–41)
Albumin: 4.6 g/dL (ref 3.5–5.0)
Alkaline Phosphatase: 46 U/L (ref 38–126)
Anion gap: 10 (ref 5–15)
BUN: 17 mg/dL (ref 6–20)
CO2: 27 mmol/L (ref 22–32)
Calcium: 9.6 mg/dL (ref 8.9–10.3)
Chloride: 101 mmol/L (ref 98–111)
Creatinine, Ser: 0.89 mg/dL (ref 0.44–1.00)
GFR, Estimated: >60 mL/min (ref >60)
Glucose, Bld: 108 mg/dL — ABNORMAL HIGH (ref 70–99)
Potassium: 4.7 mmol/L (ref 3.5–5.1)
Sodium: 138 mmol/L (ref 135–145)
Total Bilirubin: 0.8 mg/dL (ref 0.3–1.2)
Total Protein: 7.3 g/dL (ref 6.5–8.1)

## 2021-01-15 MED ORDER — POLYMYXIN B-TRIMETHOPRIM 10000-0.1 UNIT/ML-% OP SOLN: 1.0000 [drp] | OPHTHALMIC | 0 refills | Status: AC

## 2021-01-15 MED ORDER — DOXYCYCLINE HYCLATE 100 MG PO CAPS: 100.0000 mg | ORAL_CAPSULE | Freq: Two times a day (BID) | ORAL | 0 refills | Status: AC

## 2021-01-15 MED ORDER — TETRACAINE HCL 0.5 % OP SOLN
1.0000 [drp] | Freq: Once | OPHTHALMIC | Status: AC
  Administered 2021-01-15: 1 [drp] via OPHTHALMIC
  Filled 2021-01-15: qty 4

## 2021-01-15 MED ORDER — FLUORESCEIN SODIUM 1 MG OP STRP
1.0000 | ORAL_STRIP | Freq: Once | OPHTHALMIC | Status: AC
  Administered 2021-01-15: 1 via OPHTHALMIC
  Filled 2021-01-15: qty 1

## 2021-01-15 NOTE — ED Triage Notes (Signed)
Pt states that she was hit in the L leg with a baseball 6 days ago. States that last night she starting having more swelling and blood pooling in the leg along with nausea and dizziness. States that she has a history of PE and is on eliquis and is concerned for blood clot. States she also scratched her L inner eye as an aside. Alert and oriented.

## 2021-01-15 NOTE — ED Provider Notes (Signed)
[facility]-EMERGENCY DEPT Provider Note   CSN: [id number] Arrival date & time: 01/15/21  1150     History Chief Complaint  Patient presents with   Leg Pain   Eye Problem    Faith Thomas is a 60 y.o. female.  Faith Thomas has a history of DVT and is on Eliquis.  She was struck in the lower leg with a baseball about 6 days ago.  It was swollen and bruised around the site, and then it worsened last night.  She did participate in yoga, and she is unsure if that worsened her symptoms.  She states that is feeling a little bit different than it had, and she is also having some nausea and dizziness.  Also scratched her left eye when putting her contact in.  She is having a foreign body sensation at the inside corner.  No watering, tearing, or blurry vision.   Leg Pain Location:  Leg Time since incident:  6 days Injury: yes   Mechanism of injury comment:  Hit by a baseball Leg location:  L lower leg Pain details:    Quality:  Pressure   Radiates to:  Does not radiate   Severity:  Moderate   Onset quality:  Sudden   Duration:  6 days   Timing:  Constant   Progression:  Worsening Chronicity:  New Prior injury to area:  No Relieved by:  Nothing Worsened by:  Nothing Ineffective treatments:  Elevation Associated symptoms: swelling   Associated symptoms: no back pain, no decreased ROM, no fever, no numbness and no tingling   Eye Pain This is a new problem. The current episode started 6 to 12 hours ago. The problem occurs constantly. The problem has not changed since onset.Pertinent negatives include no chest pain, no abdominal pain and no shortness of breath. Exacerbated by: blinking. Nothing relieves the symptoms. She has tried nothing for the symptoms. The treatment provided no relief.      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 No pertinent surgical history.   OB History   No obstetric history on file.     Family History  Problem Relation Age of Onset   Arthritis Mother    Hypertension Mother    Mental illness Mother    Anxiety disorder Mother    Depression Mother    Arthritis Father    Hypertension Father    Mental illness Father    Anxiety disorder Father    Depression Father    Mental illness Brother  ADD / ADHD Brother    Psychosis Brother    Post-traumatic stress disorder Brother    Arthritis Daughter    Autism Daughter    Cancer Maternal Aunt    Autism Maternal Aunt    Hypertension Maternal Grandmother    Arthritis Maternal Grandfather    Depression Cousin    Anxiety disorder Cousin    Depression Maternal Aunt     Social History   Tobacco Use   Smoking status: Never   Smokeless tobacco: Never  Vaping Use   Vaping Use: Never used  Substance Use Topics   Alcohol use: Yes    Alcohol/week: 0.0 standard drinks    Comment: occasional    Drug use: No    Home Medications Prior to Admission medications   Medication Sig Start Date End Date Taking? Authorizing Provider  acetaminophen (TYLENOL) 500 MG tablet Take 1,000 mg by mouth every 8  (eight) hours as needed.     [provider]  ALPRAZolam (XANAX) 0.5 MG tablet TAKE 1 TAB PO QHS AND 1/2-1 TABLET BY MOUTH TWICE DAILY AS NEEDED FOR ANXIETY 12/23/20   Corie Chiquito, PMHNP  apixaban (ELIQUIS) 5 MG TABS tablet Take 1 tablet (5 mg total) by mouth 2 (two) times daily. 01/24/18   Terressa Koyanagi, DO  azelastine (ASTELIN) 0.1 % nasal spray Place into both nostrils 2 (two) times daily. Use in each nostril as directed Patient not taking: Reported on 01/10/2021    [provider]  Baclofen 5 MG TABS Take 1 tablet by mouth 3 (three) times daily as needed. 11/04/20   Raulkar, Drema Pry, MD  busPIRone (BUSPAR) 30 MG tablet Take 1 tablet (30 mg total) by mouth 2 (two) times daily. 11/29/20   Corie Chiquito, PMHNP  CALCIUM PO Take by mouth.    [provider]  Cholecalciferol (VITAMIN D-3) 1000 units CAPS Take 5,000 Units by mouth daily.     [provider]  Estradiol 10 MCG TABS vaginal tablet Place 1 tablet vaginally 2 (two) times a week. 02/23/20   [provider]  fluticasone (FLONASE) 50 MCG/ACT nasal spray Place into both nostrils daily as needed.    [provider]  L-Methylfolate-Algae (DEPLIN 15) 15-90.314 MG CAPS Take 1 tablet by mouth daily. 01/10/21   Corie Chiquito, PMHNP  Lemborexant (DAYVIGO) 10 MG TABS Take 10 mg by mouth at bedtime. 01/25/21   Corie Chiquito, PMHNP  lisinopril (PRINIVIL,ZESTRIL) 10 MG tablet TAKE 1 TABLET BY MOUTH EVERY DAY 01/11/18   Terressa Koyanagi, DO  meclizine (ANTIVERT) 25 MG tablet Take 1 tablet (25 mg total) by mouth 3 (three) times daily as needed for dizziness. 10/14/20   Fayrene Helper, PA-C  Melatonin 3 MG CAPS Take by mouth.    [provider]  methocarbamol (ROBAXIN) 750 MG tablet TAKE 1 TABLET(750 MG) BY MOUTH THREE TIMES DAILY 10/15/20   Raulkar, Drema Pry, MD  mirtazapine (REMERON) 15 MG tablet TAKE 1 TABLET(15 MG) BY MOUTH AT BEDTIME 01/10/21   Corie Chiquito, PMHNP  Multiple Vitamins-Minerals (ZINC  PO) Take by mouth.    [provider]  Omega-3 Fatty Acids (OMEGA 3 PO) Take by mouth.    [provider]  Probiotic Product (PROBIOTIC PO) Take 1 capsule by mouth daily.     [provider]  propranolol (INDERAL) 10 MG tablet Take 1-2 tabs po BID prn anxiety 01/10/21   Corie Chiquito, PMHNP  sertraline (ZOLOFT) 100 MG tablet TAKE 2 TABLETS(200 MG) BY MOUTH AT BEDTIME  01/10/21   Corie Chiquitoarter, Jessica, PMHNP  sucralfate (CARAFATE) 1 g tablet Take 1 g by mouth 2 (two) times daily as needed.  Patient not taking: Reported on 01/10/2021 11/08/18   [provider]  traMADol (ULTRAM) 50 MG tablet TAKE 1 TABLET BY MOUTH THREE TIMES DAILY AS NEEDED 10/14/20   Raulkar, Drema PryKrutika P, MD  tretinoin (RETIN-A) 0.025 % cream  03/16/20   [provider]  valACYclovir (VALTREX) 1000 MG tablet Take 1,000 mg by mouth 3 (three) times daily. Patient not taking: Reported on 01/10/2021 02/06/20   [provider]  vitamin C (ASCORBIC ACID) 500 MG tablet Take 1,000 mg by mouth daily.     [provider]    Allergies    Citalopram, Escitalopram, Bee venom, Latex, Penicillins, Cymbalta [duloxetine hcl], Escitalopram oxalate, Other, Progesterone, Trintellix [vortioxetine], Bupropion, Duloxetine, and Gabapentin  Review of Systems   Review of Systems  Constitutional:  Negative for chills and fever.  HENT:  Negative for ear pain and sore throat.   Eyes:  Positive for pain. Negative for visual disturbance.  Respiratory:  Negative for cough and shortness of breath.   Cardiovascular:  Negative for chest pain and palpitations.  Gastrointestinal:  Negative for abdominal pain and vomiting.  Genitourinary:  Negative for dysuria and hematuria.  Musculoskeletal:  Negative for arthralgias and back pain.  Skin:  Positive for color change. Negative for rash.  Neurological:  Negative for seizures and syncope.  All other systems reviewed and are negative.  Physical Exam Updated Vital  Signs BP (!) 139/105 (BP Location: Left Arm)   Pulse 80   Temp 98.9 F (37.2 C) (Oral)   Resp 18   Ht 5\' 5"  (1.651 m)   Wt 69.4 kg   SpO2 100%   BMI 25.46 kg/m   Physical Exam Vitals and nursing note reviewed.  HENT:     Head: Normocephalic and atraumatic.  Eyes:     General: Lids are normal. No scleral icterus.    Extraocular Movements: Extraocular movements intact.     Conjunctiva/sclera: Conjunctivae normal.     Pupils: Pupils are equal, round, and reactive to light.     Right eye: No corneal abrasion or fluorescein uptake.     Left eye: No corneal abrasion or fluorescein uptake.  Pulmonary:     Effort: Pulmonary effort is normal. No respiratory distress.  Musculoskeletal:     Cervical back: Normal range of motion.     Comments: There is a large, circular erythematous wound at the proximal and medial aspect of the left lower leg consistent with her history of being struck by a baseball.  There is bruising somewhat diffusely around the entire medial leg extending to the medial ankle.  Lower leg compartments are soft.  Distal pulses are intact.  Range of motion is normal at the ankle and the knee.  There is slight warmth in the overlying skin near the site of redness.  Skin:    General: Skin is warm and dry.  Neurological:     Mental Status: She is alert.  Psychiatric:        Mood and Affect: Mood normal.    ED Results / Procedures / Treatments   Labs (all labs ordered are listed, but only abnormal results are displayed) Labs Reviewed  COMPREHENSIVE METABOLIC PANEL - Abnormal; Notable for the following components:      Result Value   Glucose, Bld 108 (*)    All other components within normal limits  CBC WITH  DIFFERENTIAL/PLATELET    EKG None  Radiology [place] LT LOWER EXTREM LTD SOFT TISSUE NON VASCULAR  Result Date: 01/15/2021 CLINICAL DATA:  Hematoma and contusion. Injury to LEFT leg with a baseball 6 days ago. EXAM: ULTRASOUND LEFT LOWER EXTREMITY LIMITED  TECHNIQUE: Ultrasound examination of the lower extremity soft tissues was performed in the area of clinical concern. COMPARISON:  None. FINDINGS: Ultrasound of the medial LEFT lower extremity was performed, corresponding to the area of clinical concern. There is a nearly anechoic collection within the subcutaneous soft tissues, measuring 1.6 x 0.6 x 1.4 cm, compatible with aging hematoma. There is associated surrounding soft tissue edema/contusion. IMPRESSION: Nearly anechoic collection within the subcutaneous soft tissues, measuring 1.6 cm, avascular, compatible with aging hematoma. Associated surrounding soft tissue edema/contusion. Electronically Signed   By: Bary Richard M.D.   On: 01/15/2021 14:47    Procedures Procedures   Medications Ordered in ED Medications  tetracaine (PONTOCAINE) 0.5 % ophthalmic solution 1 drop (1 drop Right Eye Given 01/15/21 1238)  fluorescein ophthalmic strip 1 strip (1 strip Right Eye Given 01/15/21 1239)    ED Course  I have reviewed the triage vital signs and the nursing notes.  Pertinent labs & imaging results that were available during my care of the patient were reviewed by me and considered in my medical decision making (see chart for details).    MDM Rules/Calculators/A&P                           Liliane Bade presented with progressive swelling and some redness over the left lower extremity where she had a contusion.  She has a resolving hematoma.  I have a little bit concerned about the development of cellulitis since she has had some redness in the overlying skin.  I have started her on antibiotics.  She was reassured that this will continue to resolve and improve but that it could take awhile to fully resolve.  She was advised on return precautions.  I also told her that this is not a blood clot.  She is on anticoagulation, and this represents the sequela of bleeding rather than clotting.  As for her eye, I did not see a discrete corneal  abrasion, but her history is supportive of this fact.  She is a contact lens wearer, and I advised her to not wear her contacts.  She was also given antibiotic eyedrops.  Return precautions were discussed. Final Clinical Impression(s) / ED Diagnoses Final diagnoses:  Hematoma and contusion  Abrasion of left cornea, initial encounter  Long term (current) use of anticoagulants    Rx / DC Orders ED Discharge Orders          Ordered    trimethoprim-polymyxin b (POLYTRIM) ophthalmic solution  Every 4 hours        01/15/21 1512    doxycycline (VIBRAMYCIN) 100 MG capsule  2 times daily        01/15/21 1514             Koleen Distance, MD 01/15/21 1600

## 2021-01-24 ENCOUNTER — Ambulatory Visit: Payer: PRIVATE HEALTH INSURANCE | Admitting: Physical Medicine and Rehabilitation

## 2021-02-03 ENCOUNTER — Other Ambulatory Visit: Payer: Self-pay

## 2021-02-03 ENCOUNTER — Encounter: Payer: Self-pay | Admitting: Physical Medicine and Rehabilitation

## 2021-02-03 ENCOUNTER — Encounter
Payer: No Typology Code available for payment source | Attending: Physical Medicine and Rehabilitation | Admitting: Physical Medicine and Rehabilitation

## 2021-02-03 VITALS — BP 112/82 | HR 86 | Temp 98.3°F | Ht 65.0 in | Wt 154.0 lb

## 2021-02-03 DIAGNOSIS — R5382 Chronic fatigue, unspecified: Secondary | ICD-10-CM | POA: Diagnosis not present

## 2021-02-03 DIAGNOSIS — M7918 Myalgia, other site: Secondary | ICD-10-CM | POA: Diagnosis not present

## 2021-02-03 DIAGNOSIS — M4807 Spinal stenosis, lumbosacral region: Secondary | ICD-10-CM | POA: Diagnosis not present

## 2021-02-03 DIAGNOSIS — M797 Fibromyalgia: Secondary | ICD-10-CM | POA: Diagnosis not present

## 2021-02-03 NOTE — Progress Notes (Addendum)
Subjective:    Patient ID: Faith Thomas, female    DOB: 03/31/1961, 60 y.o.   MRN: [id number]  HPI  Faith Thomas is a 60 year old woman who presents for f/u of her fibromyalgia, facet arthropathy, myofascial pain, and lumbar spinal stenosis.   She received her radiofrequency nerve ablation from Dr. Wynn BankerKirsteins on 7/22 of her left L5 dorsal ramus and left L3 and L4 medial branches. She had good benefit from this. We had discussed that this would help her back pain but not her leg pain. We tried Qutenza for her back pain and leg pain last visit with good benefit.  She is on Eliquis (prescribed by her PCP, she does not see a cardiologist), which she stopped three days prior to prior ESI. She knows to have driver on the day of procedures.   She also has a painful thoracic paraspinal spasm on left side. She has greatly benefited from trigger points for this. This radiates down to her lumbar spine and she is not sure if it is connected to her lower back pain.   She has a There Cane and uses a soft foam roller and rolls on that regularly. She has a left sided thoracic parasponal muscle spasm. Ice helps decrease the pain.  She would like to be able to have less pain in order to do more activities with her 888 year old granddaughter.    She is doing Noom, a weight loss app based on psychology. Being aware of emoitional eating, lost a little bit of weight. Her goal is to get down to 135. Height is 5'5."   She has been using ginger a fair amount. It caused a lot of gastric issues.   She has had very good benefit from trigger point injections.   She has been doing meditation, using the 10% Happier app, with good benefits. At times she does have a lot of fatigue.   She has lost 5 lbs in the past month!  Average pain is 4/5, pain right now is 5/10.  Pain Inventory Average Pain 4 Pain Right Now 5 My pain is sharp, burning, dull, stabbing, tingling and aching  In the last 24 hours, has  pain interfered with the following? General activity 4 Relation with others 3 Enjoyment of life 4 What TIME of day is your pain at its worst? varies Sleep (in general) Fair  Pain is worse with: walking, bending, inactivity and standing Pain improves with: rest, heat/ice, therapy/exercise, medication and injections Relief from Meds: 6  Family History  Problem Relation Age of Onset   Arthritis Mother    Hypertension Mother    Mental illness Mother    Anxiety disorder Mother    Depression Mother    Arthritis Father    Hypertension Father    Mental illness Father    Anxiety disorder Father    Depression Father    Mental illness Brother    ADD / ADHD Brother    Psychosis Brother    Post-traumatic stress disorder Brother    Arthritis Daughter    Autism Daughter    Cancer Maternal Aunt    Autism Maternal Aunt    Hypertension Maternal Grandmother    Arthritis Maternal Grandfather    Depression Cousin    Anxiety disorder Cousin    Depression Maternal Aunt    Social History   Socioeconomic History   Marital status: Divorced    Spouse name: Not on file   Number of children: Not  on file   Years of education: Not on file   Highest education level: Not on file  Occupational History   Not on file  Tobacco Use   Smoking status: Never   Smokeless tobacco: Never  Vaping Use   Vaping Use: Never used  Substance and Sexual Activity   Alcohol use: Yes    Alcohol/week: 0.0 standard drinks    Comment: occasional    Drug use: No   Sexual activity: Not on file  Other Topics Concern   Not on file  Social History Narrative   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 No pertinent surgical  history. History reviewed. No pertinent surgical history. 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   Hypertension    Osteoporosis    PE (pulmonary thromboembolism) (HCC)    Positive TB test    Raynaud disease    Scoliosis    Urine incontinence    BP 112/82   Pulse 86   Temp 98.3 F (36.8 C) (Oral)   Ht 5\' 5"  (1.651 m)   Wt 154 lb (69.9 kg)   SpO2 95%   BMI 25.63 kg/m   Opioid Risk Score:   Fall Risk Score:  `1  Depression screen PHQ 2/9  Depression screen [facility] 2/9 02/03/2021 07/09/2020 02/27/2020 04/15/2019 01/07/2018 09/06/2017  Decreased Interest 0 0 0 0 3 0  Down, Depressed, Hopeless 0 0 0 0 3 2  PHQ - 2 Score 0 0 0 0 6 2    Review of Systems  Constitutional: Negative.   HENT: Negative.    Eyes: Negative.   Respiratory: Negative.    Cardiovascular: Negative.   Gastrointestinal: Negative.   Endocrine: Negative.   Genitourinary: Negative.   Musculoskeletal: Negative.   Skin: Negative.   Allergic/Immunologic: Negative.   Neurological: Negative.   Hematological: Negative.   Psychiatric/Behavioral: Negative.    All other systems reviewed and are negative.     Objective:   Physical Exam Gen: no distress, normal appearing, weight 154 lbs, BMI 25.63, BP 112/82 HEENT: oral mucosa pink and moist, NCAT Cardio: Reg rate Chest: normal effort, normal rate of breathing Abd: soft, non-distended Ext: no edema Skin: intact Neuro: Alert and oriented x3 Musculoskeletal: + slump test on the left for pain radiating down leg in L5 and S1 nerve root distribution. Full and painless flexion and extension. Left sided thoracic paraspinal trigger points. Psych: pleasant, normal affect    Assessment & Plan:  1) Lumbar spondylosis with neurogenic claudication, left side 2) Bilateral  facet joint arthritis, lumbar 3) left sided thoracic paraspinal spasm   -Faith Thomas's chief complaint today is lower back pain with left sided sciatica in the L5 and S1 distribution. On physical exam, she has painless FROM on flexion with her bilateral low back pain. On last visit this was reproduced with extension but currently she has full and painless extension. Her pain has been most consistent with left  L5 and S1 nerve root impingement secondary to stenosis and lower back pain secondary to facet arthropathy, which is seen on her most recent MRI, which shows worsening facet arthropathy as well as left sided annular fissure; results reviewed and discussed with patient previously. She would benefit and is agreeable to a course of physical therapy focused on core strengthening. She would like to hold off on starting this until COVID-19 cases have reduced. For now, she will continue her daily yoga practice (she performed 2 hours today and does so regularly), which has been shown to help low back pain and which helps her.   -She is interested in left thoracic paraspinal trigger point injections- will schedule at next available appointment with me.   -She had good benefit from radiofrequency nerve ablation with Dr. Wynn Banker of left L5 dorsal ramus and left L3 and L4 medial branches. We educated her that this would not help with her leg pain and now she would like to address this. Scheduled for ESI with Dr. Wynn Banker of left L5 and S1. She will hold her Eliquis for three days prior. She will have driver.    -Will refill Robaxin to 750mg  TID and Tramadol 50mg  TID PRN which are working for her and which she is tolerating without side effects, on an as needed basis.   -Discussed Qutenza as an option for neuropathic pain control. Discussed that this is a capsaicin patch, stronger than capsaicin cream. Discussed that it is currently approved for diabetic peripheral neuropathy and post-herpetic neuralgia,  but that it has also shown benefit in treating other forms of neuropathy. Provided patient with link to site to learn more about the patch: . Discussed that the patch would be placed in office and benefits usually last 3 months. Discussed that unintended exposure to capsaicin can cause severe irritation of eyes, mucous membranes, respiratory tract, and skin, but that Qutenza is a local treatment and does not have the systemic side effects of other nerve medications. Discussed that there may be pain, itching, erythema, and decreased sensory function associated with the application of Qutenza. Side effects usually subside within 1 week. A cold pack of analgesic medications can help with these side effects. Blood pressure can also be increased due to pain associated with administration of the patch. Scheduled for 2nd Qutenza application.   4) Fibromyalgia -Patient's symptoms have greatly improved with her change in job, allowing her to sleep better. Continue daily yoga and meditation for stress relief. Discussed the goal of increasing her aerobic exercise to 15 minutes of daily outdoor walking for now.  -Recommended starting NAC (N-Acetyl cysteine) 600mg  BID for her fatigue. Discussed its benefits in boosting glutathione and mitochondrial function.   5) Osteopenia: Counseled regarding supplements, diet, and exercise to improve bone density. She is also following with rheumatology and on Fosfomax, as well as with a dietician. She has been following diet plan well. Advised regarding risk of corticosteroids when osteopenic.    6) Hypotension: BP better controlled today at 112/82. Advised that she log her BP daily and bring reads to follow-up appointment with me or PCP so medication can be appropriately adjusted.   7) General health: provided dietary counseling.   --Follow up in 1 months for continued management of above conditions.    8) Thoracic myofascial pain Trigger points  performed as follows: Indication: Thoracic and sacral myofascial pain not relieved by medication management and other conservative care.  Informed consent was obtained after describing risk and benefits of the procedure  with the patient, this includes bleeding, bruising, infection and medication side effects.  The patient wishes to proceed and has given written consent.  The patient was placed in a seated position.  The thoracic and sacral area was marked and prepped with Betadine.  It was entered with a 25-gauge 1/2 inch needle and a total of 5 mL of 1% lidocaine and normal salinewas injected into a total of 4 trigger points, after negative draw back for blood.  The patient tolerated the procedure well.  Post procedure instructions were given.

## 2021-02-03 NOTE — Addendum Note (Signed)
Addended by: Horton Chin on: 02/03/2021 09:00 PM   Modules accepted: Level of Service

## 2021-03-11 ENCOUNTER — Encounter
Payer: No Typology Code available for payment source | Attending: Physical Medicine and Rehabilitation | Admitting: Physical Medicine and Rehabilitation

## 2021-03-11 ENCOUNTER — Encounter: Payer: Self-pay | Admitting: Physical Medicine and Rehabilitation

## 2021-03-11 ENCOUNTER — Other Ambulatory Visit: Payer: Self-pay

## 2021-03-11 VITALS — Temp 98.2°F | Ht 65.0 in | Wt 155.0 lb

## 2021-03-11 DIAGNOSIS — M4807 Spinal stenosis, lumbosacral region: Secondary | ICD-10-CM | POA: Insufficient documentation

## 2021-03-11 DIAGNOSIS — M7918 Myalgia, other site: Secondary | ICD-10-CM | POA: Diagnosis present

## 2021-03-11 DIAGNOSIS — M47817 Spondylosis without myelopathy or radiculopathy, lumbosacral region: Secondary | ICD-10-CM | POA: Insufficient documentation

## 2021-03-11 NOTE — Progress Notes (Addendum)
Subjective:    Patient ID: Faith Thomas, female    DOB: 07/28/1960, 60 y.o.   MRN: [id number]  HPI  Faith Thomas is a 60 year old woman who presents for f/u of her fibromyalgia, facet arthropathy, myofascial pain, and lumbar spinal stenosis.   She received her radiofrequency nerve ablation from Dr. Wynn Banker on 7/22 of her left L5 dorsal ramus and left L3 and L4 medial branches. She had good benefit from this. We had discussed that this would help her back pain but not her leg pain. We tried Qutenza for her back pain and leg pain last visit with good benefit. She would like to repeat today. She no longer has pain radiating into her leg!  She is on Eliquis (prescribed by her PCP, she does not see a cardiologist), which she stopped three days prior to prior ESI. She knows to have driver on the day of procedures.   She also has a painful thoracic paraspinal spasm on left side. She has greatly benefited from trigger points for this. This radiates down to her lumbar spine and she is not sure if it is connected to her lower back pain.   She has a There Cane and uses a soft foam roller and rolls on that regularly. She has a left sided thoracic parasponal muscle spasm. Ice helps decrease the pain.  She would like to be able to have less pain in order to do more activities with her 46 year old granddaughter.    She is doing Noom, a weight loss app based on psychology. Being aware of emoitional eating, lost a little bit of weight. Her goal is to get down to 135. Height is 5'5."   She has been using ginger a fair amount. It caused a lot of gastric issues.   She has had very good benefit from trigger point injections.   She has been doing meditation, using the 10% Happier app, with good benefits. At times she does have a lot of fatigue.   She has lost 5 lbs in the past month!  Average pain is 4/5, pain right now is 5/10.  Pain Inventory Average Pain 4 Pain Right Now 5 My pain is  sharp, burning, dull, stabbing, tingling and aching  In the last 24 hours, has pain interfered with the following? General activity 4 Relation with others 3 Enjoyment of life 4 What TIME of day is your pain at its worst? varies Sleep (in general) Fair  Pain is worse with: walking, bending, inactivity and standing Pain improves with: rest, heat/ice, therapy/exercise, medication and injections Relief from Meds: 6  Family History  Problem Relation Age of Onset   Arthritis Mother    Hypertension Mother    Mental illness Mother    Anxiety disorder Mother    Depression Mother    Arthritis Father    Hypertension Father    Mental illness Father    Anxiety disorder Father    Depression Father    Mental illness Brother    ADD / ADHD Brother    Psychosis Brother    Post-traumatic stress disorder Brother    Arthritis Daughter    Autism Daughter    Cancer Maternal Aunt    Autism Maternal Aunt    Hypertension Maternal Grandmother    Arthritis Maternal Grandfather    Depression Cousin    Anxiety disorder Cousin    Depression Maternal Aunt    Social History   Socioeconomic History   Marital status:  Divorced    Spouse name: Not on file   Number of children: Not on file   Years of education: Not on file   Highest education level: Not on file  Occupational History   Not on file  Tobacco Use   Smoking status: Never   Smokeless tobacco: Never  Vaping Use   Vaping Use: Never used  Substance and Sexual Activity   Alcohol use: Yes    Alcohol/week: 0.0 standard drinks    Comment: occasional    Drug use: No   Sexual activity: Not on file  Other Topics Concern   Not on file  Social History Narrative   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 No pertinent surgical history. History reviewed. No pertinent surgical history. 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   Hypertension    Osteoporosis    PE (pulmonary thromboembolism) (HCC)    Positive TB test    Raynaud disease    Scoliosis    Urine incontinence    Temp 98.2 F (36.8 C)   Ht 5\' 5"  (1.651 m)   Wt 155 lb (70.3 kg)   BMI 25.79 kg/m   Opioid Risk Score:   Fall Risk Score:  `1  Depression screen PHQ 2/9  Depression screen [facility] 2/9 03/11/2021 02/03/2021 07/09/2020 02/27/2020 04/15/2019 01/07/2018 09/06/2017  Decreased Interest 0 0 0 0 0 3 0  Down, Depressed, Hopeless 0 0 0 0 0 3 2  PHQ - 2 Score 0 0 0 0 0 6 2    Review of Systems  Constitutional: Negative.   HENT: Negative.    Eyes: Negative.   Respiratory: Negative.    Cardiovascular: Negative.   Gastrointestinal: Negative.   Endocrine: Negative.   Genitourinary: Negative.   Musculoskeletal: Negative.   Skin: Negative.   Allergic/Immunologic: Negative.   Neurological: Negative.   Hematological: Negative.   Psychiatric/Behavioral: Negative.    All other systems reviewed and are negative.     Objective:   Physical Exam Gen: no distress, normal appearing, weight 154 lbs, BMI 25.63, BP 112/82 HEENT: oral mucosa pink and moist, NCAT Cardio: Reg rate Chest: normal effort, normal rate of breathing Abd: soft, non-distended Ext: no edema Skin: intact Neuro: Alert and oriented x3 Musculoskeletal: No longer with pain radiating down leg in L5 and S1 nerve root distribution! Full and painless flexion and extension. Left sided thoracic paraspinal and gluteal trigger points. Psych: pleasant, normal affect    Assessment & Plan:  1) Lumbar spondylosis with  neurogenic claudication, left side 2) Bilateral facet joint arthritis, lumbar 3) left sided thoracic paraspinal spasm   -Faith Thomas's chief complaint today is left sided thoracic and gluteal pain. Her left sided sciatica in the L5 and S1 distribution resolved with prior Qutenza treatment! On physical exam, she has painless FROM on flexion with her bilateral low back pain. On last visit this was reproduced with extension but currently she has  full and painless extension. Her pain has been most consistent with left L5 and S1 nerve root impingement secondary to stenosis and lower back pain secondary to facet arthropathy, which is seen on her most recent MRI, which shows worsening facet arthropathy as well as left sided annular fissure; results reviewed and discussed with patient previously. She would benefit and is agreeable to a course of physical therapy focused on core strengthening. She would like to hold off on starting this until COVID-19 cases have reduced. For now, she will continue her daily yoga practice (she performed 2 hours today and does so regularly), which has been shown to help low back pain and which helps her.  -Discussed current symptoms of pain and history of pain.  -Discussed benefits of exercise in reducing pain. -Discussed following foods that may reduce pain: 1) Ginger (especially studied for arthritis)- reduce leukotriene production to decrease inflammation 2) Blueberries- high in phytonutrients that decrease inflammation 3) Salmon- marine omega-3s reduce joint swelling and pain 4) Pumpkin seeds- reduce inflammation 5) dark chocolate- reduces inflammation 6) turmeric- reduces inflammation 7) tart cherries - reduce pain and stiffness 8) extra virgin olive oil - its compound olecanthal helps to block prostaglandins  9) chili peppers- can be eaten or applied topically via capsaicin 10) mint- helpful for headache, muscle aches, joint pain, and itching 11) garlic- reduces  inflammation  Link to further information on diet for chronic pain: [email or web address]   -Good benefit from left thoracic paraspinal trigger point injections in the past  -She had good benefit from radiofrequency nerve ablation with Dr. Wynn Banker of left L5 dorsal ramus and left L3 and L4 medial branches. We educated her that this would not help with her leg pain and now she would like to address this. Has also had temporary relief from left L5 and S1 ESI and full relief since last Qutenza administration.    -Continue Robaxin to 750mg  TID and Tramadol 50mg  TID PRN which are working for her and which she is tolerating without side effects, on an as needed basis.   -Discussed Qutenza as an option for neuropathic pain control. Discussed that this is a capsaicin patch, stronger than capsaicin cream. Discussed that it is currently approved for diabetic peripheral neuropathy and post-herpetic neuralgia, but that it has also shown benefit in treating other forms of neuropathy. Provided patient with link to site to learn more about the patch: . Discussed that the patch would be placed in office and benefits usually last 3 months. Discussed that unintended exposure to capsaicin can cause severe irritation of eyes, mucous membranes, respiratory tract, and skin, but that Qutenza is a local treatment and does not have the systemic side effects of other nerve medications. Discussed that there may be pain, itching, erythema, and decreased sensory function associated with the application of Qutenza. Side effects usually subside within 1 week. A cold pack of analgesic medications can help with these side effects. Blood pressure can also be increased due to pain associated with administration of the patch.  4 patches of Qutenza was applied to the area of pain. Ice packs were applied during the procedure to ensure patient  comfort. Blood pressure was monitored every 15 minutes. The patient tolerated the procedure well. Post-procedure instructions were given and follow-up has been scheduled.  NDC  4) Fibromyalgia -Patient's symptoms have greatly improved with her change in job, allowing her to sleep better. Continue daily yoga and meditation for stress relief. Discussed the goal of increasing her aerobic exercise  to 15 minutes of daily outdoor walking for now.  -Recommended continuing NAC (N-Acetyl cysteine) 600mg  BID for her fatigue. Discussed its benefits in boosting glutathione and mitochondrial function. -continue fibromyalgia relief journal   5) Osteopenia: Counseled regarding supplements, diet, and exercise to improve bone density. She is also following with rheumatology and on Fosfomax, as well as with a dietician. She has been following diet plan well. Advised regarding risk of corticosteroids when osteopenic.    6) Hypotension: BP recently 112/82. Advised that she log her BP daily and bring reads to follow-up appointment with me or PCP so medication can be appropriately adjusted.  -Advised regarding healthy foods that can help lower blood pressure and provided with a list: 1) citrus foods- high in vitamins and minerals 2) salmon and other fatty fish - reduces inflammation and oxylipins 3) swiss chard (leafy green)- high level of nitrates 4) pumpkin seeds- one of the best natural sources of magnesium 5) Beans and lentils- high in fiber, magnesium, and potassium 6) Berries- high in flavonoids 7) Amaranth (whole grain, can be cooked similarly to rice and oats)- high in magnesium and fiber 8) Pistachios- even more effective at reducing BP than other nuts 9) Carrots- high in phenolic compounds that relax blood vessels and reduce inflammation 10) Celery- contain phthalides that relax tissues of arterial walls 11) Tomatoes- can also improve cholesterol and reduce risk of heart disease 12) Broccoli-  good source of magnesium, calcium, and potassium 13) Greek yogurt: high in potassium and calcium 14) Herbs and spices: Celery seed, cilantro, saffron, lemongrass, black cumin, ginseng, cinnamon, cardamom, sweet basil, and ginger 15) Chia and flax seeds- also help to lower cholesterol and blood sugar 16) Beets- high levels of nitrates that relax blood vessels  17) spinach and bananas- high in potassium  -Provided lise of supplements that can help with hypertension:  1) magnesium: one high quality brand is Bioptemizers since it contains all 7 types of magnesium, otherwise over the counter magnesium gluconate 400mg  is a good option 2) B vitamins 3) vitamin D 4) potassium 5) CoQ10 6) L-arginine 7) Vitamin C 8) Beetroot -Educated that goal BP is 120/80. -Made goal to incorporate some of the above foods into diet.    7) General health: provided dietary counseling.   --Follow up in 1 months for continued management of above conditions.    8) Thoracic myofascial pain: good benefit with trigger point injections  9) Overweight BMI 25.79: -Educated that current weight is 155lbs -Educated regarding health benefits of weight loss- for pain, general health, chronic disease prevention, immune health, mental health.  -Will monitor weight every visit.  -Consider Roobois tea daily.  -Discussed the benefits of intermittent fasting. -Discussed foods that can assist in weight loss: 1) leafy greens- high in fiber and nutrients 2) dark chocolate- improves metabolism (if prefer sweetened, best to sweeten with honey instead of sugar).  3) cruciferous vegetables- high in fiber and protein 4) full fat yogurt: high in healthy fat, protein, calcium, and probiotics 5) apples- high in a variety of phytochemicals 6) nuts- high in fiber and protein that increase feelings of fullness 7) grapefruit: rich in nutrients, antioxidants, and fiber (not to be taken with anticoagulation) 8) beans- high in protein and  fiber 9) salmon- has high quality protein and healthy fats 10) green tea- rich in polyphenols 11) eggs- rich in choline and vitamin D 12) tuna- high protein, boosts metabolism 13) avocado- decreases visceral abdominal fat 14) chicken (pasture raised): high in protein and iron  15) blueberries- reduce abdominal fat and cholesterol 16) whole grains- decreases calories retained during digestion, speeds metabolism 17) chia seeds- curb appetite 18) chilies- increases fat metabolism  -Discussed supplements that can be used:  1) Metatrim 400mg  BID 30 minutes before breakfast and dinner  2) Sphaeranthus indicus and Garcinia mangostana (combinations of these and #1 can be found in capsicum and zychrome  3) green coffee bean extract 400mg  twice per day or Irvingia (african mango) 150 to 300mg  twice per day.  >40 minutes spent in discussion of risks and benefits of Qutenza and obtaining informed consent, discussion of q90 day follow-up and expectation of improvement in pain with each repeat application, discussion of family stressor, her plan to change to a less emotionally taxing work position, response to last Qutenza treatment

## 2021-03-15 ENCOUNTER — Other Ambulatory Visit: Payer: Self-pay

## 2021-03-18 MED ORDER — DAYVIGO 10 MG PO TABS: 10.0000 mg | ORAL_TABLET | Freq: Every day | ORAL | 0 refills | Status: DC

## 2021-04-01 ENCOUNTER — Other Ambulatory Visit: Payer: Self-pay | Admitting: Physical Medicine and Rehabilitation

## 2021-04-01 NOTE — Telephone Encounter (Signed)
Sent!

## 2021-04-06 ENCOUNTER — Ambulatory Visit: Payer: PRIVATE HEALTH INSURANCE | Admitting: Physical Medicine and Rehabilitation

## 2021-04-07 ENCOUNTER — Other Ambulatory Visit: Payer: Self-pay

## 2021-04-07 ENCOUNTER — Encounter
Payer: No Typology Code available for payment source | Attending: Physical Medicine and Rehabilitation | Admitting: Physical Medicine and Rehabilitation

## 2021-04-07 VITALS — BP 129/87 | HR 73 | Ht 65.0 in | Wt 154.6 lb

## 2021-04-07 DIAGNOSIS — M7918 Myalgia, other site: Secondary | ICD-10-CM | POA: Diagnosis present

## 2021-04-07 NOTE — Progress Notes (Signed)
Trigger Point Injection  Indication: thoracic and lumbar myofascial pain not relieved by medication management and other conservative care.  Informed consent was obtained after describing risk and benefits of the procedure with the patient, this includes bleeding, bruising, infection and medication side effects.  The patient wishes to proceed and has given written consent.  The patient was placed in a seated position.  The area of pain was marked and prepped with Betadine.  It was entered with a 25-gauge 1/2 inch needle and a total of 5 mL of 1% lidocaine and normal saline was injected into a total of 8 trigger points, after negative draw back for blood.  The patient tolerated the procedure well.  Post procedure instructions were given.

## 2021-04-11 ENCOUNTER — Encounter: Payer: Self-pay | Admitting: Psychiatry

## 2021-04-11 ENCOUNTER — Ambulatory Visit (INDEPENDENT_AMBULATORY_CARE_PROVIDER_SITE_OTHER): Payer: No Typology Code available for payment source | Admitting: Psychiatry

## 2021-04-11 ENCOUNTER — Other Ambulatory Visit: Payer: Self-pay

## 2021-04-11 DIAGNOSIS — F411 Generalized anxiety disorder: Secondary | ICD-10-CM

## 2021-04-11 DIAGNOSIS — F410 Panic disorder [episodic paroxysmal anxiety] without agoraphobia: Secondary | ICD-10-CM

## 2021-04-11 DIAGNOSIS — F5101 Primary insomnia: Secondary | ICD-10-CM | POA: Diagnosis not present

## 2021-04-11 DIAGNOSIS — F3340 Major depressive disorder, recurrent, in remission, unspecified: Secondary | ICD-10-CM | POA: Diagnosis not present

## 2021-04-11 MED ORDER — BUSPIRONE HCL 30 MG PO TABS: 30.0000 mg | ORAL_TABLET | Freq: Two times a day (BID) | ORAL | 1 refills | Status: DC

## 2021-04-11 MED ORDER — PROPRANOLOL HCL 10 MG PO TABS: ORAL_TABLET | ORAL | 1 refills | Status: DC

## 2021-04-11 MED ORDER — ALPRAZOLAM 0.5 MG PO TABS: ORAL_TABLET | ORAL | 5 refills | Status: DC

## 2021-04-11 MED ORDER — SERTRALINE HCL 100 MG PO TABS: ORAL_TABLET | ORAL | 1 refills | Status: DC

## 2021-04-11 MED ORDER — TRAZODONE HCL 100 MG PO TABS: ORAL_TABLET | ORAL | 1 refills | Status: DC

## 2021-04-11 MED ORDER — DAYVIGO 10 MG PO TABS: 10.0000 mg | ORAL_TABLET | Freq: Every day | ORAL | 5 refills | Status: DC

## 2021-04-11 NOTE — Progress Notes (Signed)
Faith Thomas [id number] Nov 11, 1960 60 y.o.  Subjective:   Patient ID:  Faith Thomas is a 60 y.o. (DOB 1960-12-20) female.  Chief Complaint: No chief complaint on file.   HPI Faith Thomas presents to the office today for follow-up of anxiety, depression, and insomnia.   Brother committed suicide. Reports that she is not sure how long he had been deceased before he was found. "In a lot of ways it's less painful than him not being in the pain he was in." She is the executor of his will. She reports thinking he was going to commit suicide. Planning a Celebration of Life service in December.   She changed jobs and was scheduled to start the day after she got the call about her brother's death. 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 She reports that she is having to learn new information. Continues to work from home.   Has increased anxiety "because of what I am dealing with" but has been less nervous in regards to work. Has had some panic attacks. Some intrusive memories. Difficulty staying asleep. "My body can't calm down all the way." Denies nightmares. Has been trying to go to bed earlier. Initially was awakening around 1 am and now awakening later. Will try to return to sleep.   Reports sadness related to grief. Denies persistent depression. Notices some diminished concentration. She reports that "adrenaline' seems to propel her to do what is needed. Energy is low by night-time. Appetite is ok. Notices some stress eating. Denies SI.   Granddaughter, Faith Thomas, and daughter, Faith Thomas, continue to live with her. Daughter is caring more for her daughter.   Father will be coming from [place] for a week.   Taking Xanax 1 mg at bedtime to help with sleep and rarely needing Xanax prn.   Stopped Mirtazapine and weight has been more stable.   Past Medication Trials: Buspar-Effective Trintellix- memory  difficulties Sertraline- Effective Cymbalta- memory difficulties Prozac-stomach pain Paxil Celexa Lexapro Remeron- Effective Wellbutrin XL- Irritability Lamictal- ineffective Klonopin Xanax Propranolol Doxepin Trazodone- stopped working [employer]- More effective.  Deplin Lyrica- hypomania Gabapentin- sleep disturbance Amitriptyline-sleeplessness, ineffective.  PHQ2-9    Flowsheet Row Office Visit from 04/07/2021 in [facility] Office Visit from 03/11/2021 in [facility] Medicine [facility] Rehabilitation Office Visit from 02/03/2021 in [facility] Office Visit from 07/09/2020 in [facility] Physical Medicine and Rehabilitation Office Visit from 02/27/2020 in [facility] Physical Medicine and Rehabilitation  PHQ-2 Total Score 6 0 0 0 0      Flowsheet Row ED from 01/15/2021 in [facility] ED from 10/14/2020 in [facility]  C-SSRS RISK CATEGORY No Risk No Risk        Review of Systems:  Review of Systems  HENT:  Positive for congestion and voice change.   Musculoskeletal:  Negative for gait problem.  Neurological:  Negative for tremors.  Psychiatric/Behavioral:         Please refer to HPI   Medications: I have reviewed the patient's current medications.  Current Outpatient Medications  Medication Sig Dispense Refill   acetaminophen (TYLENOL) 500 MG tablet Take 1,000 mg by mouth every 8 (eight) hours as needed.      apixaban (ELIQUIS) 5 MG TABS tablet Take 1 tablet (5 mg total) by mouth 2 (two) times daily. 180 tablet 1   Baclofen 5 MG TABS Take  1 tablet by mouth 3 (three) times daily as needed. 90 tablet 3   CALCIUM PO Take by mouth.     Cholecalciferol (VITAMIN D-3) 1000 units CAPS Take 5,000 Units by mouth daily.      Estradiol 10 MCG TABS vaginal tablet Place 1 tablet vaginally 2 (two) times a week.     fluticasone (FLONASE)  50 MCG/ACT nasal spray Place into both nostrils daily as needed.     L-Methylfolate-Algae (DEPLIN 15) 15-90.314 MG CAPS Take 1 tablet by mouth daily. 90 capsule 3   lisinopril (PRINIVIL,ZESTRIL) 10 MG tablet TAKE 1 TABLET BY MOUTH EVERY DAY 34 tablet 2   Melatonin 3 MG CAPS Take by mouth.     methocarbamol (ROBAXIN) 750 MG tablet TAKE 1 TABLET(750 MG) BY MOUTH THREE TIMES DAILY 90 tablet 3   Multiple Vitamin (MULTIVITAMIN) tablet Take 1 tablet by mouth daily.     Multiple Vitamins-Minerals (ZINC PO) Take by mouth.     Omega-3 Fatty Acids (OMEGA 3 PO) Take by mouth.     Probiotic Product (PROBIOTIC PO) Take 1 capsule by mouth daily.      traMADol (ULTRAM) 50 MG tablet Take 1 tablet (50 mg total) by mouth 3 (three) times daily as needed. 90 tablet 1   tretinoin (RETIN-A) 0.025 % cream      vitamin C (ASCORBIC ACID) 500 MG tablet Take 1,000 mg by mouth daily.      [START ON 05/27/2021] ALPRAZolam (XANAX) 0.5 MG tablet TAKE 1 TAB PO QHS AND 1/2-1 TABLET BY MOUTH TWICE DAILY AS NEEDED FOR ANXIETY 75 tablet 5   amitriptyline (ELAVIL) 10 MG tablet  (Patient not taking: Reported on 04/11/2021)     azelastine (ASTELIN) 0.1 % nasal spray Place into both nostrils 2 (two) times daily. Use in each nostril as directed (Patient not taking: Reported on 04/11/2021)     busPIRone (BUSPAR) 30 MG tablet Take 1 tablet (30 mg total) by mouth 2 (two) times daily. 180 tablet 1   [START ON 05/04/2021] Lemborexant (DAYVIGO) 10 MG TABS Take 10 mg by mouth at bedtime. 30 tablet 5   propranolol (INDERAL) 10 MG tablet Take 1-2 tabs po BID prn anxiety 120 tablet 1   sertraline (ZOLOFT) 100 MG tablet TAKE 2 TABLETS(200 MG) BY MOUTH AT BEDTIME 180 tablet 1   traZODone (DESYREL) 100 MG tablet Take 1/2-1 tablet po QHS prn insomnia 90 tablet 1   No current facility-administered medications for this visit.    Medication Side Effects: None  Allergies:  Allergies  Allergen Reactions   Citalopram Tinitus   Escitalopram Tinitus    Bee Venom Swelling   Latex Swelling   Penicillins Swelling and Rash    Has patient had a PCN reaction causing immediate rash, facial/tongue/throat swelling, SOB or lightheadedness with hypotension: Yes Has patient had a PCN reaction causing severe rash involving mucus membranes or skin necrosis: No Has patient had a PCN reaction that required hospitalization: No Has patient had a PCN reaction occurring within the last 10 years: No If all of the above answers are "NO", then may proceed with Cephalosporin use.   Cymbalta [Duloxetine Hcl]     -memory loss   Escitalopram Oxalate Tinitus   Other     Yeast creams- causes secondary infections, itching, burning, redness    Progesterone     Pt feels caused weakness, fatigue, cognitive deficits   Trintellix [Vortioxetine]     Memory problems   Bupropion Other (See Comments)    weakness  and malaise.   Duloxetine Other (See Comments)   Gabapentin Other (See Comments)    Could not sleep.      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   Please see review of systems for further details on the patient's review from today.   Objective:   Physical Exam:  There were no vitals taken for this visit.  Physical Exam Constitutional:      General: She is not in acute distress. Musculoskeletal:        General: No deformity.  Neurological:     Mental Status: She is alert and oriented to person, place, and time.     Coordination: Coordination normal.  Psychiatric:         Attention and Perception: Attention and perception normal. She does not perceive auditory or visual hallucinations.        Mood and Affect: Affect is not labile, blunt, angry or inappropriate.        Speech: Speech normal.        Behavior: Behavior normal.        Thought Content: Thought content normal. Thought content is not paranoid or delusional. Thought content does not include homicidal or suicidal ideation. Thought content does not include homicidal or suicidal plan.        Cognition and Memory: Cognition and memory normal.        Judgment: Judgment normal.     Comments: Insight intact Mood is appropriate to content. Affect is congruent    Lab Review:     Component Value Date/Time   NA 138 01/15/2021 1238   NA 134 (L) 01/05/2017 1359   K 4.7 01/15/2021 1238   K 3.9 01/05/2017 1359   CL 101 01/15/2021 1238   CO2 27 01/15/2021 1238   CO2 25 01/05/2017 1359   GLUCOSE 108 (H) 01/15/2021 1238   GLUCOSE 106 01/05/2017 1359   BUN 17 01/15/2021 1238   BUN 10.2 01/05/2017 1359   CREATININE 0.89 01/15/2021 1238   CREATININE 0.8 01/05/2017 1359   CALCIUM 9.6 01/15/2021 1238   CALCIUM 9.5 01/05/2017 1359   PROT 7.3 01/15/2021 1238   PROT 6.4 01/05/2017 1359   ALBUMIN 4.6 01/15/2021 1238   ALBUMIN 4.2 01/05/2017 1359   AST 25 01/15/2021 1238   AST 18 01/05/2017 1359   ALT 24 01/15/2021 1238   ALT 15 01/05/2017 1359   ALKPHOS 46 01/15/2021 1238   ALKPHOS 60 01/05/2017 1359   BILITOT 0.8 01/15/2021 1238   BILITOT 0.22 01/05/2017 1359   GFRNONAA >60 01/15/2021 1238   GFRAA >60 12/04/2016 1548       Component Value Date/Time   WBC 5.1 01/15/2021 1238   RBC 4.22 01/15/2021 1238   HGB 13.9 01/15/2021 1238   HGB 13.4 01/05/2017 1359   HCT 40.2 01/15/2021 1238   HCT 39.0 01/05/2017 1359   PLT 277 01/15/2021 1238   PLT 274 01/05/2017 1359  MCV 95.3 01/15/2021 1238   MCV 97.5 01/05/2017 1359   MCH 32.9 01/15/2021 1238   MCHC 34.6 01/15/2021 1238   RDW 12.5 01/15/2021 1238    RDW 12.4 01/05/2017 1359   LYMPHSABS 2.2 01/15/2021 1238   LYMPHSABS 3.0 01/05/2017 1359   MONOABS 0.4 01/15/2021 1238   MONOABS 0.6 01/05/2017 1359   EOSABS 0.1 01/15/2021 1238   EOSABS 0.1 01/05/2017 1359   BASOSABS 0.0 01/15/2021 1238   BASOSABS 0.0 01/05/2017 1359    No results found for: POCLITH, LITHIUM   No results found for: PHENYTOIN, PHENOBARB, VALPROATE, CBMZ   .res Assessment: Plan:   Will continue current medications without changes at this time since she reports that her mood and anxiety s/s have been manageable, even during recent stressors and loss.  Will continue Sertraline 200 mg po qd for anxiety and depression.  Continue Trazodone 100 mg 1/2-1 tab po QHS prn insomnia.  Continue Propranolol prn anxiety.  Continue L-Methylfolate 15 mg po qd for MTHFR mutation and augmentation of depression.  Continue Dayvigo 10 mg po QHS for insomnia.  Continue Buspar 30 mg po bid for anxiety.  Continue Xanax prn severe anxiety.  Pt to follow-up in 4 months or sooner if clinically indicated.  Patient advised to contact office with any questions, adverse effects, or acute worsening in signs and symptoms.   Diagnoses and all orders for this visit:  Primary insomnia -     traZODone (DESYREL) 100 MG tablet; Take 1/2-1 tablet po QHS prn insomnia  Generalized anxiety disorder -     propranolol (INDERAL) 10 MG tablet; Take 1-2 tabs po BID prn anxiety -     ALPRAZolam (XANAX) 0.5 MG tablet; TAKE 1 TAB PO QHS AND 1/2-1 TABLET BY MOUTH TWICE DAILY AS NEEDED FOR ANXIETY -     busPIRone (BUSPAR) 30 MG tablet; Take 1 tablet (30 mg total) by mouth 2 (two) times daily. -     sertraline (ZOLOFT) 100 MG tablet; TAKE 2 TABLETS(200 MG) BY MOUTH AT BEDTIME  Panic disorder -     propranolol (INDERAL) 10 MG tablet; Take 1-2 tabs po BID prn anxiety  MDD (recurrent major depressive disorder) -     sertraline (ZOLOFT) 100 MG tablet; TAKE 2 TABLETS(200 MG) BY MOUTH AT BEDTIME  Other orders -      Lemborexant (DAYVIGO) 10 MG TABS; Take 10 mg by mouth at bedtime.    Please see After Visit Summary for patient specific instructions.  Future Appointments  Date Time Provider Department Center  06/30/2021  9:20 AM Raulkar, Drema Pry, MD [facility]  08/04/2021 11:00 AM Raulkar, Drema Pry, MD [facility]  08/09/2021  8:30 AM Corie Chiquito, PMHNP [facility] None    No orders of the defined types were placed in this encounter.   -------------------------------

## 2021-06-13 ENCOUNTER — Other Ambulatory Visit: Payer: Self-pay | Admitting: Physical Medicine and Rehabilitation

## 2021-06-13 DIAGNOSIS — M4807 Spinal stenosis, lumbosacral region: Secondary | ICD-10-CM

## 2021-06-13 DIAGNOSIS — M7918 Myalgia, other site: Secondary | ICD-10-CM

## 2021-06-15 ENCOUNTER — Other Ambulatory Visit: Payer: Self-pay

## 2021-06-15 DIAGNOSIS — F410 Panic disorder [episodic paroxysmal anxiety] without agoraphobia: Secondary | ICD-10-CM

## 2021-06-15 DIAGNOSIS — F411 Generalized anxiety disorder: Secondary | ICD-10-CM

## 2021-06-15 MED ORDER — PROPRANOLOL HCL 10 MG PO TABS: ORAL_TABLET | ORAL | 1 refills | Status: DC

## 2021-06-30 ENCOUNTER — Encounter
Payer: PRIVATE HEALTH INSURANCE | Attending: Physical Medicine and Rehabilitation | Admitting: Physical Medicine and Rehabilitation

## 2021-06-30 ENCOUNTER — Other Ambulatory Visit: Payer: Self-pay

## 2021-06-30 ENCOUNTER — Encounter: Payer: Self-pay | Admitting: Physical Medicine and Rehabilitation

## 2021-06-30 VITALS — BP 107/71 | HR 88 | Temp 98.7°F | Ht 65.0 in | Wt 152.0 lb

## 2021-06-30 DIAGNOSIS — M4807 Spinal stenosis, lumbosacral region: Secondary | ICD-10-CM | POA: Insufficient documentation

## 2021-06-30 DIAGNOSIS — M797 Fibromyalgia: Secondary | ICD-10-CM | POA: Insufficient documentation

## 2021-06-30 NOTE — Progress Notes (Signed)
Subjective:    Patient ID: Faith Thomas, female    DOB: 1960-08-02, 61 y.o.   MRN: [id number]  HPI  Faith Thomas is a 61 year old woman who presents for f/u of her fibromyalgia, facet arthropathy, myofascial pain, and lumbar spinal stenosis.   She received her radiofrequency nerve ablation from Dr. Wynn BankerKirsteins on 7/22 of her left L5 dorsal ramus and left L3 and L4 medial branches. She had good benefit from this. We had discussed that this would help her back pain but not her leg pain. We tried Qutenza for her back pain and leg pain last visit with good benefit. She would like to repeat today.  Pain is radiating into left leg, but to a lesser extend than it did before Last time we had done a 40 minute Qutenza appointment, discussed trying 30 minute appointment as this seems to be long enough to provide relief. Pain is overall better between her exercise, physical therapy, Qutenza, and trigger point injections.   She is on Eliquis (prescribed by her PCP, she does not see a cardiologist), which she stopped three days prior to prior ESI. She knows to have driver on the day of procedures.   She also has a painful thoracic paraspinal spasm on left side. She has greatly benefited from trigger points for this. This radiates down to her lumbar spine and she is not sure if it is connected to her lower back pain.   She has a There Cane and uses a soft foam roller and rolls on that regularly. She has a left sided thoracic parasponal muscle spasm. Ice helps decrease the pain.  She would like to be able to have less pain in order to do more activities with her 59 year old granddaughter.    She is doing Noom, a weight loss app based on psychology. Being aware of emoitional eating, lost a little bit of weight. Her goal is to get down to 135. Height is 5'5."   She has been using ginger a fair amount. It caused a lot of gastric issues.   She has had very good benefit from trigger point injections.    She has been doing meditation, using the 10% Happier app, with good benefits. At times she does have a lot of fatigue.   She has lost 5 lbs in the past month!  Average pain is 4/5, pain right now is 5/10.  Pain Inventory Average Pain 4 Pain Right Now 5 My pain is sharp, burning, dull, stabbing, tingling and aching  In the last 24 hours, has pain interfered with the following? General activity 4 Relation with others 3 Enjoyment of life 4 What TIME of day is your pain at its worst? varies Sleep (in general) Fair  Pain is worse with: walking, bending, inactivity and standing Pain improves with: rest, heat/ice, therapy/exercise, medication and injections Relief from Meds: 6  Family History  Problem Relation Age of Onset   Arthritis Mother    Hypertension Mother    Mental illness Mother    Anxiety disorder Mother    Depression Mother    Arthritis Father    Hypertension Father    Mental illness Father    Anxiety disorder Father    Depression Father    Mental illness Brother    ADD / ADHD Brother    Psychosis Brother    Post-traumatic stress disorder Brother    Suicidality Brother    Cancer Maternal Aunt    Autism Maternal Aunt  Depression Maternal Aunt    Arthritis Maternal Grandfather    Hypertension Maternal Grandmother    Depression Cousin    Anxiety disorder Cousin    Arthritis Daughter    Autism Daughter    Social History   Socioeconomic History   Marital status: Divorced    Spouse name: Not on file   Number of children: Not on file   Years of education: Not on file   Highest education level: Not on file  Occupational History   Not on file  Tobacco Use   Smoking status: Never   Smokeless tobacco: Never  Vaping Use   Vaping Use: Never used  Substance and Sexual Activity   Alcohol use: Yes    Alcohol/week: 0.0 standard drinks    Comment: occasional    Drug use: No   Sexual activity: Not on file  Other Topics Concern   Not on file  Social  History Narrative   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 No past surgical history on file. 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   Hypertension    Osteoporosis    PE (pulmonary thromboembolism) (HCC)    Positive TB test    Raynaud disease    Scoliosis    Urine incontinence    BP 107/71    Pulse 88    Temp 98.7 F (37.1 C) (Oral)    Ht 5\' 5"  (1.651 m)    Wt 152 lb (68.9 kg)    SpO2 98%    BMI 25.29 kg/m   Opioid Risk Score:   Fall Risk Score:  `1  Depression screen PHQ 2/9  Depression screen [facility] 2/9 04/07/2021 03/11/2021 02/03/2021 07/09/2020 02/27/2020 04/15/2019 01/07/2018  Decreased Interest 3 0 0 0 0 0 3  Down, Depressed, Hopeless 3 0 0 0 0 0 3  PHQ - 2 Score 6 0 0 0 0 0 6    Review of Systems  Constitutional: Negative.   HENT: Negative.    Eyes: Negative.   Respiratory: Negative.    Cardiovascular: Negative.   Gastrointestinal: Negative.   Endocrine: Negative.   Genitourinary: Negative.   Musculoskeletal: Negative.   Skin: Negative.   Allergic/Immunologic: Negative.   Neurological: Negative.   Hematological: Negative.   Psychiatric/Behavioral: Negative.    All other systems reviewed and are negative.     Objective:   Physical Exam Gen: no distress, normal appearing, weight 152 lbs- 2lb weight loss since last visit!, BMI 25.29, BP 117/71 HEENT: oral mucosa pink and moist, NCAT Cardio: Reg rate Chest: normal effort, normal rate of breathing Abd:  soft, non-distended Ext: no edema Skin: intact Neuro: Alert and oriented x3 Musculoskeletal: No longer with pain radiating down leg in L5 and S1 nerve root distribution! Full and painless flexion and extension. Left sided thoracic paraspinal and gluteal trigger points. Psych: pleasant, normal affect    Assessment & Plan:  1) Lumbar  spondylosis with neurogenic claudication, left side 2) Bilateral facet joint arthritis, lumbar 3) left sided thoracic paraspinal spasm   -Faith Thomas's chief complaint today is left sided thoracic and gluteal pain. Her left sided sciatica in the L5 and S1 distribution resolved with prior Qutenza treatment! On physical exam, she has painless FROM on flexion with her bilateral low back pain. On last visit this was reproduced with extension but currently she has full and painless extension. Her pain has been most consistent with left L5 and S1 nerve root impingement secondary to stenosis and lower back pain secondary to facet arthropathy, which is seen on her most recent MRI, which shows worsening facet arthropathy as well as left sided annular fissure; results reviewed and discussed with patient previously. She would benefit and is agreeable to a course of physical therapy focused on core strengthening. She would like to hold off on starting this until COVID-19 cases have reduced. For now, she will continue her daily yoga practice (she performed 2 hours today and does so regularly), which has been shown to help low back pain and which helps her.  -Discussed current symptoms of pain and history of pain.  -Discussed benefits of exercise in reducing pain. -Discussed following foods that may reduce pain: 1) Ginger (especially studied for arthritis)- reduce leukotriene production to decrease inflammation 2) Blueberries- high in phytonutrients that decrease inflammation 3) Salmon- marine omega-3s reduce joint swelling and pain 4) Pumpkin seeds- reduce inflammation 5) dark  chocolate- reduces inflammation 6) turmeric- reduces inflammation 7) tart cherries - reduce pain and stiffness 8) extra virgin olive oil - its compound olecanthal helps to block prostaglandins  9) chili peppers- can be eaten or applied topically via capsaicin 10) mint- helpful for headache, muscle aches, joint pain, and itching 11) garlic- reduces inflammation  Link to further information on diet for chronic pain: [email or web address].[email or web address]   -Good benefit from left thoracic paraspinal trigger point injections in the past  -She had good benefit from radiofrequency nerve ablation with Dr. Wynn Banker of left L5 dorsal ramus and left L3 and L4 medial branches. We educated her that this would not help with her leg pain and now she would like to address this. Has also had temporary relief from left L5 and S1 ESI and full relief since last Qutenza administration.    -Continue Robaxin to 750mg  TID and Tramadol 50mg  TID PRN which are working for her and which she is tolerating without side effects, on an as needed basis.   -Discussed Qutenza as an option for neuropathic pain control. Discussed that this is a capsaicin patch, stronger than capsaicin cream. Discussed that it is currently approved for diabetic peripheral neuropathy and post-herpetic neuralgia, but that it has also shown benefit in treating other forms of neuropathy. Provided patient with link to site to learn more about the patch: . Discussed that the patch would be placed in office and benefits usually last 3 months. Discussed that unintended exposure to capsaicin can cause severe irritation of eyes, mucous membranes, respiratory tract, and skin, but that Qutenza is a local treatment and does not have the systemic side effects of other nerve medications. Discussed that there may be pain, itching, erythema, and decreased sensory function associated with the  application of Qutenza. Side effects usually subside within 1 week. A cold pack of analgesic medications can help with these side effects. Blood pressure can also be increased due to pain associated with administration of the patch.   1 patch of Qutenza was applied  to the area of pain. Ice packs were applied during the procedure to ensure patient comfort. Blood pressure was monitored every 15 minutes. The patient tolerated the procedure well. Post-procedure instructions were given and follow-up has been scheduled.    4) Fibromyalgia -Patient's symptoms have greatly improved with her change in job, allowing her to sleep better. Continue daily yoga and meditation for stress relief. Discussed the goal of increasing her aerobic exercise to 15 minutes of daily outdoor walking for now.  -Recommended continuing NAC (N-Acetyl cysteine) 600mg  BID for her fatigue. Discussed its benefits in boosting glutathione and mitochondrial function. -continue fibromyalgia relief journal -Provided with a pain relief journal and discussed that it contains foods and lifestyle tips to naturally help to improve pain. Discussed that these lifestyle strategies are also very good for health unlike some medications which can have negative side effects. Discussed that the act of keeping a journal can be therapeutic and helpful to realize patterns what helps to trigger and alleviate pain.     5) Osteopenia: Counseled regarding supplements, diet, and exercise to improve bone density. She is also following with rheumatology and on Fosfomax, as well as with a dietician. She has been following diet plan well. Advised regarding risk of corticosteroids when osteopenic.    6) Hypotension: BP recently 112/82. Advised that she log her BP daily and bring reads to follow-up appointment with me or PCP so medication can be appropriately adjusted.  -Advised regarding healthy foods that can help lower blood pressure and provided with a list: 1)  citrus foods- high in vitamins and minerals 2) salmon and other fatty fish - reduces inflammation and oxylipins 3) swiss chard (leafy green)- high level of nitrates 4) pumpkin seeds- one of the best natural sources of magnesium 5) Beans and lentils- high in fiber, magnesium, and potassium 6) Berries- high in flavonoids 7) Amaranth (whole grain, can be cooked similarly to rice and oats)- high in magnesium and fiber 8) Pistachios- even more effective at reducing BP than other nuts 9) Carrots- high in phenolic compounds that relax blood vessels and reduce inflammation 10) Celery- contain phthalides that relax tissues of arterial walls 11) Tomatoes- can also improve cholesterol and reduce risk of heart disease 12) Broccoli- good source of magnesium, calcium, and potassium 13) Greek yogurt: high in potassium and calcium 14) Herbs and spices: Celery seed, cilantro, saffron, lemongrass, black cumin, ginseng, cinnamon, cardamom, sweet basil, and ginger 15) Chia and flax seeds- also help to lower cholesterol and blood sugar 16) Beets- high levels of nitrates that relax blood vessels  17) spinach and bananas- high in potassium  -Provided lise of supplements that can help with hypertension:  1) magnesium: one high quality brand is Bioptemizers since it contains all 7 types of magnesium, otherwise over the counter magnesium gluconate 400mg  is a good option 2) B vitamins 3) vitamin D 4) potassium 5) CoQ10 6) L-arginine 7) Vitamin C 8) Beetroot -Educated that goal BP is 120/80. -Made goal to incorporate some of the above foods into diet.    7) General health: provided dietary counseling.   --Follow up in 1 months for continued management of above conditions.    8) Thoracic myofascial pain: good benefit with trigger point injections  9) Overweight BMI 25.79: -Educated that current weight is 155lbs -Educated regarding health benefits of weight loss- for pain, general health, chronic disease  prevention, immune health, mental health.  -Will monitor weight every visit.  -Consider Roobois tea daily.  -Discussed the benefits of  intermittent fasting. -Discussed foods that can assist in weight loss: 1) leafy greens- high in fiber and nutrients 2) dark chocolate- improves metabolism (if prefer sweetened, best to sweeten with honey instead of sugar).  3) cruciferous vegetables- high in fiber and protein 4) full fat yogurt: high in healthy fat, protein, calcium, and probiotics 5) apples- high in a variety of phytochemicals 6) nuts- high in fiber and protein that increase feelings of fullness 7) grapefruit: rich in nutrients, antioxidants, and fiber (not to be taken with anticoagulation) 8) beans- high in protein and fiber 9) salmon- has high quality protein and healthy fats 10) green tea- rich in polyphenols 11) eggs- rich in choline and vitamin D 12) tuna- high protein, boosts metabolism 13) avocado- decreases visceral abdominal fat 14) chicken (pasture raised): high in protein and iron 15) blueberries- reduce abdominal fat and cholesterol 16) whole grains- decreases calories retained during digestion, speeds metabolism 17) chia seeds- curb appetite 18) chilies- increases fat metabolism  -Discussed supplements that can be used:  1) Metatrim 400mg  BID 30 minutes before breakfast and dinner  2) Sphaeranthus indicus and Garcinia mangostana (combinations of these and #1 can be found in capsicum and zychrome  3) green coffee bean extract 400mg  twice per day or Irvingia (african mango) 150 to 300mg  twice per day.

## 2021-07-22 ENCOUNTER — Encounter (HOSPITAL_COMMUNITY): Payer: Self-pay

## 2021-07-22 ENCOUNTER — Emergency Department (HOSPITAL_COMMUNITY): Payer: PRIVATE HEALTH INSURANCE

## 2021-07-22 ENCOUNTER — Emergency Department (HOSPITAL_COMMUNITY)
Admission: EM | Admit: 2021-07-22 | Discharge: 2021-07-22 | Disposition: A | Discharge status: Home / Self Care | Payer: PRIVATE HEALTH INSURANCE | Attending: Emergency Medicine | Admitting: Emergency Medicine

## 2021-07-22 DIAGNOSIS — E876 Hypokalemia: Secondary | ICD-10-CM | POA: Diagnosis not present

## 2021-07-22 DIAGNOSIS — Z91040 Latex allergy status: Secondary | ICD-10-CM | POA: Diagnosis not present

## 2021-07-22 DIAGNOSIS — Z20822 Contact with and (suspected) exposure to covid-19: Secondary | ICD-10-CM | POA: Insufficient documentation

## 2021-07-22 DIAGNOSIS — J180 Bronchopneumonia, unspecified organism: Secondary | ICD-10-CM | POA: Insufficient documentation

## 2021-07-22 DIAGNOSIS — E871 Hypo-osmolality and hyponatremia: Secondary | ICD-10-CM | POA: Diagnosis not present

## 2021-07-22 DIAGNOSIS — Z86711 Personal history of pulmonary embolism: Secondary | ICD-10-CM | POA: Diagnosis not present

## 2021-07-22 DIAGNOSIS — E878 Other disorders of electrolyte and fluid balance, not elsewhere classified: Secondary | ICD-10-CM | POA: Insufficient documentation

## 2021-07-22 DIAGNOSIS — Z7901 Long term (current) use of anticoagulants: Secondary | ICD-10-CM | POA: Diagnosis not present

## 2021-07-22 DIAGNOSIS — R0602 Shortness of breath: Secondary | ICD-10-CM | POA: Diagnosis present

## 2021-07-22 DIAGNOSIS — J181 Lobar pneumonia, unspecified organism: Secondary | ICD-10-CM | POA: Insufficient documentation

## 2021-07-22 DIAGNOSIS — J168 Pneumonia due to other specified infectious organisms: Secondary | ICD-10-CM | POA: Insufficient documentation

## 2021-07-22 DIAGNOSIS — J189 Pneumonia, unspecified organism: Secondary | ICD-10-CM

## 2021-07-22 LAB — CBC WITH DIFFERENTIAL/PLATELET
Abs Immature Granulocytes: 0.08 10*3/uL — ABNORMAL HIGH (ref 0.00–0.07)
Basophils Absolute: 0 10*3/uL (ref 0.0–0.1)
Basophils Relative: 0 %
Eosinophils Absolute: 0.1 10*3/uL (ref 0.0–0.5)
Eosinophils Relative: 1 %
HCT: 37 % (ref 36.0–46.0)
Hemoglobin: 12.8 g/dL (ref 12.0–15.0)
Immature Granulocytes: 1 %
Lymphocytes Relative: 26 %
Lymphs Abs: 2.4 10*3/uL (ref 0.7–4.0)
MCH: 33 pg (ref 26.0–34.0)
MCHC: 34.6 g/dL (ref 30.0–36.0)
MCV: 95.4 fL (ref 80.0–100.0)
Monocytes Absolute: 1 10*3/uL (ref 0.1–1.0)
Monocytes Relative: 11 %
Neutro Abs: 5.6 10*3/uL (ref 1.7–7.7)
Neutrophils Relative %: 61 %
Platelets: 387 10*3/uL (ref 150–400)
RBC: 3.88 MIL/uL (ref 3.87–5.11)
RDW: 12.4 % (ref 11.5–15.5)
WBC: 9.2 10*3/uL (ref 4.0–10.5)
nRBC: 0 % (ref 0.0–0.2)

## 2021-07-22 LAB — BASIC METABOLIC PANEL
Anion gap: 10 (ref 5–15)
BUN: 6 mg/dL (ref 6–20)
CO2: 25 mmol/L (ref 22–32)
Calcium: 9.2 mg/dL (ref 8.9–10.3)
Chloride: 97 mmol/L — ABNORMAL LOW (ref 98–111)
Creatinine, Ser: 0.67 mg/dL (ref 0.44–1.00)
GFR, Estimated: >60 mL/min (ref >60)
Glucose, Bld: 101 mg/dL — ABNORMAL HIGH (ref 70–99)
Potassium: 3 mmol/L — ABNORMAL LOW (ref 3.5–5.1)
Sodium: 132 mmol/L — ABNORMAL LOW (ref 135–145)

## 2021-07-22 LAB — TROPONIN I (HIGH SENSITIVITY)
Troponin I (High Sensitivity): 4 ng/L (ref <18)
Troponin I (High Sensitivity): 3 ng/L (ref <18)

## 2021-07-22 LAB — RESP PANEL BY RT-PCR (FLU A&B, COVID) ARPGX2
Influenza A by PCR: NEGATIVE
Influenza B by PCR: NEGATIVE
SARS Coronavirus 2 by RT PCR: NEGATIVE

## 2021-07-22 LAB — BRAIN NATRIURETIC PEPTIDE: B Natriuretic Peptide: 36.2 pg/mL (ref 0.0–100.0)

## 2021-07-22 MED ORDER — SODIUM CHLORIDE 0.9 % IV BOLUS
1000.0000 mL | Freq: Once | INTRAVENOUS | Status: AC
  Administered 2021-07-22: 1000 mL via INTRAVENOUS

## 2021-07-22 MED ORDER — MAGNESIUM OXIDE -MG SUPPLEMENT 400 (240 MG) MG PO TABS
800.0000 mg | ORAL_TABLET | Freq: Once | ORAL | Status: AC
  Administered 2021-07-22: 800 mg via ORAL
  Filled 2021-07-22: qty 2

## 2021-07-22 MED ORDER — DOXYCYCLINE HYCLATE 100 MG PO CAPS: 100.0000 mg | ORAL_CAPSULE | Freq: Two times a day (BID) | ORAL | 0 refills | Status: DC

## 2021-07-22 MED ORDER — LEVOFLOXACIN 750 MG PO TABS: 750.0000 mg | ORAL_TABLET | Freq: Every day | ORAL | 0 refills | Status: DC

## 2021-07-22 MED ORDER — DOXYCYCLINE HYCLATE 100 MG PO TABS
100.0000 mg | ORAL_TABLET | Freq: Once | ORAL | Status: DC
  Filled 2021-07-22: qty 1

## 2021-07-22 MED ORDER — IOHEXOL 350 MG/ML SOLN
100.0000 mL | Freq: Once | INTRAVENOUS | Status: AC | PRN
  Administered 2021-07-22: 100 mL via INTRAVENOUS

## 2021-07-22 MED ORDER — LEVOFLOXACIN 750 MG PO TABS
750.0000 mg | ORAL_TABLET | Freq: Once | ORAL | Status: AC
  Administered 2021-07-22: 750 mg via ORAL
  Filled 2021-07-22: qty 1

## 2021-07-22 MED ORDER — BENZONATATE 100 MG PO CAPS: 100.0000 mg | ORAL_CAPSULE | Freq: Three times a day (TID) | ORAL | 0 refills | Status: DC

## 2021-07-22 MED ORDER — POTASSIUM CHLORIDE CRYS ER 20 MEQ PO TBCR
40.0000 meq | EXTENDED_RELEASE_TABLET | Freq: Once | ORAL | Status: AC
Start: 2021-07-22 — End: 2021-07-22
  Administered 2021-07-22: 40 meq via ORAL
  Filled 2021-07-22: qty 2

## 2021-07-22 NOTE — ED Provider Triage Note (Signed)
Emergency Medicine Provider Triage Evaluation Note  My Rinke , a 61 y.o. female  was evaluated in triage.  Pt complains of shortness of breath.  Patient has a history of PE, is anticoagulated on Eliquis.  States she has been having viral symptoms including cough, body aches, vomiting, nausea, diarrhea, sore throat x2 weeks.  Was seen in urgent care on Monday, started on Levaquin for presumed pneumonia.  Patient viral symptoms improved but she still feels short of breath, the shortness of breath worsened today.  Unsure if she had missed some doses of Eliquis in the last few weeks.  Denies any specific chest pain, states she did have chest pain during the last PE.Marland Kitchen  Review of Systems  Positive: Shortness of breath, cough Negative: Chest pain  Physical Exam  BP (!) 133/98 (BP Location: Left Arm)    Pulse 95    Temp 97.9 F (36.6 C) (Oral)    Resp 20    SpO2 95%  Gen:   Awake, anxious Resp:  Normal effort.  Breath sounds are equal bilaterally, symmetric chest rise. MSK:   Moves extremities without difficulty  Other:    Medical Decision Making  Medically screening exam initiated at 1:10 PM.  Appropriate orders placed.  Lisaanne Lawrie was informed that the remainder of the evaluation will be completed by another provider, this initial triage assessment does not replace that evaluation, and the importance of remaining in the ED until their evaluation is complete.  We will start with labs and chest x-ray.  PE is on the differential but she is currently anticoagulated, not tachycardic, not hypoxic.   Theron Arista, PA-C 07/22/21 1314

## 2021-07-22 NOTE — ED Provider Notes (Signed)
[facility]-EMERGENCY DEPT Provider Note   CSN: [id number] Arrival date & time: 07/22/21  1243     History  Chief Complaint  Patient presents with   Shortness of Breath    Faith Thomas is a 61 y.o. female.  61 yo F with a chief complaints of shortness of breath cough and congestion.  This has been going on for the past couple weeks.  She has been feeling like her cough and congestion have improved but she has been having worsening trouble breathing.  Has a history of a PE in the past and thinks this feels somewhat similar.  Feels like she has a bruise on her left leg which she is worried about as well.   Shortness of Breath     Home Medications Prior to Admission medications   Medication Sig Start Date End Date Taking? Authorizing Provider  benzonatate (TESSALON) 100 MG capsule Take 1 capsule (100 mg total) by mouth every 8 (eight) hours. 07/22/21  Yes Melene Plan, DO  levofloxacin (LEVAQUIN) 750 MG tablet Take 1 tablet (750 mg total) by mouth daily. X 7 days 07/22/21  Yes Melene Plan, DO  acetaminophen (TYLENOL) 500 MG tablet Take 1,000 mg by mouth every 8 (eight) hours as needed.     [provider]  ALPRAZolam (XANAX) 0.5 MG tablet TAKE 1 TAB PO QHS AND 1/2-1 TABLET BY MOUTH TWICE DAILY AS NEEDED FOR ANXIETY 05/27/21   Corie Chiquito, PMHNP  amitriptyline (ELAVIL) 10 MG tablet  10/20/20   [provider]  apixaban (ELIQUIS) 5 MG TABS tablet Take 1 tablet (5 mg total) by mouth 2 (two) times daily. 01/24/18   Terressa Koyanagi, DO  azelastine (ASTELIN) 0.1 % nasal spray Place into both nostrils 2 (two) times daily. Use in each nostril as directed    [provider]  Baclofen 5 MG TABS Take 1 tablet by mouth 3 (three) times daily as needed. 11/04/20   Raulkar, Drema Pry, MD  busPIRone (BUSPAR) 30 MG tablet Take 1 tablet (30 mg total) by mouth 2 (two) times daily. 04/11/21   Corie Chiquito, PMHNP  CALCIUM PO Take by mouth.    [provider]  Cholecalciferol (VITAMIN D-3) 1000 units CAPS Take 5,000 Units by mouth daily.     [provider]  Estradiol 10 MCG TABS vaginal tablet Place 1 tablet vaginally 2 (two) times a week. 02/23/20   [provider]  fluticasone (FLONASE) 50 MCG/ACT nasal spray Place into both nostrils daily as needed.    [provider]  L-Methylfolate-Algae (DEPLIN 15) 15-90.314 MG CAPS Take 1 tablet by mouth daily. 01/10/21   Corie Chiquito, PMHNP  Lemborexant (DAYVIGO) 10 MG TABS Take 10 mg by mouth at bedtime. 05/04/21   Corie Chiquito, PMHNP  lisinopril (PRINIVIL,ZESTRIL) 10 MG tablet TAKE 1 TABLET BY MOUTH EVERY DAY 01/11/18   Terressa Koyanagi, DO  Melatonin 3 MG CAPS Take by mouth.    [provider]  methocarbamol (ROBAXIN) 750 MG tablet TAKE 1 TABLET(750 MG) BY MOUTH THREE TIMES DAILY 06/13/21   Raulkar, Drema Pry, MD  Multiple Vitamin (MULTIVITAMIN) tablet Take 1 tablet by mouth daily.    [provider]  Multiple Vitamins-Minerals (ZINC PO) Take by mouth.    [provider]  Omega-3 Fatty Acids (OMEGA 3 PO) Take by mouth.    [provider]  Probiotic Product (PROBIOTIC PO) Take 1 capsule by mouth daily.     [provider]  propranolol (INDERAL) 10 MG tablet Take 1-2 tabs po BID prn anxiety 06/15/21   Corie Chiquito, PMHNP  sertraline (ZOLOFT) 100 MG tablet TAKE 2 TABLETS(200 MG) BY MOUTH AT BEDTIME 04/11/21   Corie Chiquito, PMHNP  traMADol (ULTRAM) 50 MG tablet Take 1 tablet (50 mg total) by mouth 3 (three) times daily as needed. 06/13/21   Horton Chin, MD  traZODone (DESYREL) 100 MG tablet Take 1/2-1 tablet po QHS prn insomnia 04/11/21   Corie Chiquito, PMHNP  tretinoin (RETIN-A) 0.025 % cream  03/16/20   [provider]  vitamin C (ASCORBIC ACID) 500 MG tablet Take 1,000 mg by mouth daily.     [provider]      Allergies    Citalopram, Escitalopram, Bee venom, Latex, Penicillins, Cymbalta  [duloxetine hcl], Escitalopram oxalate, Other, Progesterone, Trintellix [vortioxetine], Bupropion, Duloxetine, and Gabapentin    Review of Systems   Review of Systems  Respiratory:  Positive for shortness of breath.    Physical Exam Updated Vital Signs BP (!) 127/91    Pulse 83    Temp 97.9 F (36.6 C) (Oral)    Resp 19    SpO2 99%  Physical Exam Vitals and nursing note reviewed.  Constitutional:      General: She is not in acute distress.    Appearance: She is well-developed. She is not diaphoretic.  HENT:     Head: Normocephalic and atraumatic.  Eyes:     Pupils: Pupils are equal, round, and reactive to light.  Cardiovascular:     Rate and Rhythm: Normal rate and regular rhythm.     Heart sounds: No murmur heard.   No friction rub. No gallop.  Pulmonary:     Effort: Pulmonary effort is normal.     Breath sounds: No wheezing or rales.  Abdominal:     General: There is no distension.     Palpations: Abdomen is soft.     Tenderness: There is no abdominal tenderness.  Musculoskeletal:        General: No tenderness.     Cervical back: Normal range of motion and neck supple.  Skin:    General: Skin is warm and dry.  Neurological:     Mental Status: She is alert and oriented to person, place, and time.  Psychiatric:        Behavior: Behavior normal.    ED Results / Procedures / Treatments   Labs (all labs ordered are listed, but only abnormal results are displayed) Labs Reviewed  BASIC METABOLIC PANEL - Abnormal; Notable for the following components:      Result Value   Sodium 132 (*)    Potassium 3.0 (*)    Chloride 97 (*)    Glucose, Bld 101 (*)    All other components within normal limits  CBC WITH DIFFERENTIAL/PLATELET - Abnormal; Notable for the following components:   Abs Immature Granulocytes 0.08 (*)    All other components within normal limits  RESP PANEL BY RT-PCR (FLU A&B, COVID) ARPGX2  BRAIN NATRIURETIC PEPTIDE  TROPONIN I (HIGH SENSITIVITY)  TROPONIN  I (HIGH SENSITIVITY)    EKG EKG Interpretation  Date/Time:  Friday July 22 2021 13:13:18 EST Ventricular Rate:  99 PR Interval:  126 QRS Duration: 124 QT Interval:  362 QTC Calculation: 465 R Axis:   104 Text Interpretation: Sinus rhythm Right atrial enlargement RBBB and LPFB Baseline wander in lead(s) III V1 No significant change since last tracing Confirmed by Melene Plan [phone]) on 07/22/2021 7:46:14  PM  Radiology DG Chest 2 View  Result Date: 07/22/2021 CLINICAL DATA:  Shortness of breath EXAM: CHEST - 2 VIEW COMPARISON:  Chest radiograph dated Oct 26, 2016 FINDINGS: The heart size and mediastinal contours are within normal limits. Both lungs are clear. No acute osseous abnormality. IMPRESSION: No active cardiopulmonary disease. Electronically Signed   By: Larose HiresImran  Ahmed D.O.   On: 07/22/2021 13:29   CT Angio Chest PE W and/or Wo Contrast  Result Date: 07/22/2021 CLINICAL DATA:  Pulmonary embolism suspected, high probability. Shortness of breath. Prior history of pulmonary emboli. Anticoagulated. EXAM: CT ANGIOGRAPHY CHEST WITH CONTRAST TECHNIQUE: Multidetector CT imaging of the chest was performed using the standard protocol during bolus administration of intravenous contrast. Multiplanar CT image reconstructions and MIPs were obtained to evaluate the vascular anatomy. RADIATION DOSE REDUCTION: This exam was performed according to the departmental dose-optimization program which includes automated exposure control, adjustment of the mA and/or kV according to patient size and/or use of iterative reconstruction technique. CONTRAST:  100mL OMNIPAQUE IOHEXOL 350 MG/ML SOLN COMPARISON:  Chest radiography same day. Prior CT angiogram 12/04/2016 FINDINGS: Cardiovascular: Heart size is normal. No pericardial effusion. No visible coronary artery calcification or aortic atherosclerotic calcification. Maximal diameter of the ascending aorta is 3.2 cm. Pulmonary arterial opacification is excellent.  There are no pulmonary emboli. Mediastinum/Nodes: No mediastinal lymphadenopathy. Slightly prominent right hilar nodes, likely reactive. Lungs/Pleura: No pleural effusion. The right lung is clear. No infiltrate, collapse, mass or nodule. The left lung shows focal infiltrate in the superior segment of the left lower lobe consistent with infectious bronchopneumonia. Minimal patchy involvement of the posterior left upper. Upper Abdomen: Normal Musculoskeletal: Normal Review of the MIP images confirms the above findings. IMPRESSION: No pulmonary emboli. Bronchopneumonia within the superior segment of the left lower lobe. Minimal patchy infiltrate in the posterior left upper lobe. Electronically Signed   By: Paulina FusiMark  Shogry M.D.   On: 07/22/2021 21:10    Procedures Procedures    Medications Ordered in ED Medications  sodium chloride 0.9 % bolus 1,000 mL (1,000 mLs Intravenous New Bag/Given 07/22/21 2044)  potassium chloride SA (KLOR-CON M) CR tablet 40 mEq (40 mEq Oral Given 07/22/21 2044)  magnesium oxide (MAG-OX) tablet 800 mg (800 mg Oral Given 07/22/21 2046)  iohexol (OMNIPAQUE) 350 MG/ML injection 100 mL (100 mLs Intravenous Contrast Given 07/22/21 2051)  levofloxacin (LEVAQUIN) tablet 750 mg (750 mg Oral Given 07/22/21 2156)    ED Course/ Medical Decision Making/ A&P                           Medical Decision Making Amount and/or Complexity of Data Reviewed Radiology: ordered.  Risk OTC drugs. Prescription drug management.   61 yo F with a chief complaints of shortness of breath.  Has a remote history of a PE.  More likely she has a number viral syndrome.  Her lungs are clear for me chest x-ray independently interpreted by me without focal infiltrate or pneumothorax.  She has a mild hyponatremia hypochloremia and hypokalemia.  Will give oral K and mag give a bolus of IV fluids.  She is not anemic no leukocytosis.  We will obtain a CT angiogram of the chest to evaluate for PE.  CT scan  negative for pulmonary embolism but does show left upper lobe pneumonia.  Will start on antibiotics.  Have her follow-up with her family doctor.  On discussion of the results the patient tells me that only Levaquin  works for her.  Actually is currently on Levaquin.  We will write her for 7-day course.  9:58 PM:  I have discussed the diagnosis/risks/treatment options with the patient.  Evaluation and diagnostic testing in the emergency department does not suggest an emergent condition requiring admission or immediate intervention beyond what has been performed at this time.  They will follow up with  PCP. We also discussed returning to the ED immediately if new or worsening sx occur. We discussed the sx which are most concerning (e.g., sudden worsening pain, fever, inability to tolerate by mouth) that necessitate immediate return. Medications administered to the patient during their visit and any new prescriptions provided to the patient are listed below.  Medications given during this visit Medications  sodium chloride 0.9 % bolus 1,000 mL (1,000 mLs Intravenous New Bag/Given 07/22/21 2044)  potassium chloride SA (KLOR-CON M) CR tablet 40 mEq (40 mEq Oral Given 07/22/21 2044)  magnesium oxide (MAG-OX) tablet 800 mg (800 mg Oral Given 07/22/21 2046)  iohexol (OMNIPAQUE) 350 MG/ML injection 100 mL (100 mLs Intravenous Contrast Given 07/22/21 2051)  levofloxacin (LEVAQUIN) tablet 750 mg (750 mg Oral Given 07/22/21 2156)     The patient appears reasonably screen and/or stabilized for discharge and I doubt any other medical condition or other [facility] requiring further screening, evaluation, or treatment in the ED at this time prior to discharge.          Final Clinical Impression(s) / ED Diagnoses Final diagnoses:  Pneumonia of left upper lobe due to infectious organism    Rx / DC Orders ED Discharge Orders          Ordered    benzonatate (TESSALON) 100 MG capsule  Every 8 hours        07/22/21  2144    doxycycline (VIBRAMYCIN) 100 MG capsule  2 times daily,   Status:  Discontinued        07/22/21 2144    levofloxacin (LEVAQUIN) 750 MG tablet  Daily        07/22/21 2153              Melene Plan, DO 07/22/21 2158

## 2021-07-22 NOTE — ED Triage Notes (Signed)
Pt arrived via POV, c/o cough, sore throat, worsening SOB. States was exposed to grandchild that was sick, unknown dx. Took home covid test-negative. Denies chest pain

## 2021-07-22 NOTE — ED Notes (Signed)
Walked patient around the ED while connected to pulse ox. SpO2 never fell below 95% and patient was not short of breath. When we returned to the room patient did have a non-productive cough.

## 2021-07-22 NOTE — Discharge Instructions (Signed)
Please return for worsening difficulty breathing.  Follow-up with your family doctor in the office.

## 2021-08-04 ENCOUNTER — Encounter
Payer: PRIVATE HEALTH INSURANCE | Attending: Physical Medicine and Rehabilitation | Admitting: Physical Medicine and Rehabilitation

## 2021-08-04 ENCOUNTER — Other Ambulatory Visit: Payer: Self-pay

## 2021-08-04 VITALS — BP 104/73 | HR 78 | Ht 65.0 in | Wt 151.0 lb

## 2021-08-04 DIAGNOSIS — Z5181 Encounter for therapeutic drug level monitoring: Secondary | ICD-10-CM | POA: Diagnosis present

## 2021-08-04 DIAGNOSIS — M7918 Myalgia, other site: Secondary | ICD-10-CM | POA: Diagnosis not present

## 2021-08-04 DIAGNOSIS — Z79891 Long term (current) use of opiate analgesic: Secondary | ICD-10-CM | POA: Diagnosis present

## 2021-08-04 DIAGNOSIS — G894 Chronic pain syndrome: Secondary | ICD-10-CM | POA: Diagnosis present

## 2021-08-05 NOTE — Progress Notes (Signed)
Trigger Point Injection ? ?Indication: Thoracic and gluteal myofascial pain not relieved by medication management and other conservative care. ? ?Informed consent was obtained after describing risk and benefits of the procedure with the patient, this includes bleeding, bruising, infection and medication side effects.  The patient wishes to proceed and has given written consent.  The patient was placed in a seated position.  The area of pain was marked and prepped with Betadine.  It was entered with a 25-gauge 1/2 inch needle and a total of 5 mL of 1% lidocaine and normal saline was injected into a total of 10 trigger points, after negative draw back for blood.  The patient tolerated the procedure well.  Post procedure instructions were given.  ? ?

## 2021-08-08 LAB — TOXASSURE SELECT,+ANTIDEPR,UR

## 2021-08-09 ENCOUNTER — Ambulatory Visit: Payer: No Typology Code available for payment source | Admitting: Psychiatry

## 2021-08-12 ENCOUNTER — Encounter: Payer: Self-pay | Admitting: Psychiatry

## 2021-08-12 ENCOUNTER — Ambulatory Visit (INDEPENDENT_AMBULATORY_CARE_PROVIDER_SITE_OTHER): Payer: PRIVATE HEALTH INSURANCE | Admitting: Psychiatry

## 2021-08-12 ENCOUNTER — Other Ambulatory Visit: Payer: Self-pay

## 2021-08-12 DIAGNOSIS — F3340 Major depressive disorder, recurrent, in remission, unspecified: Secondary | ICD-10-CM | POA: Diagnosis not present

## 2021-08-12 DIAGNOSIS — F411 Generalized anxiety disorder: Secondary | ICD-10-CM

## 2021-08-12 DIAGNOSIS — F5101 Primary insomnia: Secondary | ICD-10-CM

## 2021-08-12 MED ORDER — TRAZODONE HCL 100 MG PO TABS: ORAL_TABLET | ORAL | 1 refills | Status: DC

## 2021-08-12 MED ORDER — SERTRALINE HCL 100 MG PO TABS: ORAL_TABLET | ORAL | 1 refills | Status: DC

## 2021-08-12 MED ORDER — BUSPIRONE HCL 30 MG PO TABS: 30.0000 mg | ORAL_TABLET | Freq: Two times a day (BID) | ORAL | 1 refills | Status: DC

## 2021-08-12 MED ORDER — ALPRAZOLAM 0.5 MG PO TABS: ORAL_TABLET | ORAL | 5 refills | Status: DC

## 2021-08-12 MED ORDER — DAYVIGO 10 MG PO TABS: 10.0000 mg | ORAL_TABLET | Freq: Every day | ORAL | 2 refills | Status: DC

## 2021-08-12 NOTE — Progress Notes (Signed)
Faith Thomas [id number] 05/10/1961 61 y.o.  Subjective:   Patient ID:  Faith Thomas is a 61 y.o. (DOB Jun 17, 1960) female.  Chief Complaint:  Chief Complaint  Patient presents with   Anxiety   Depression    HPI Faith Thomas presents to the office today for follow-up of anxiety, depression, and insomnia. She reports that she has "been doing a lot of internal work and making some progress." She reports that she continues to have "panic-like reactions" in response to certain severe stressors and then is able to talk herself through it and re-frame it. She reports that she no longer has constant panic-like symptoms. She reports high anxiety periods continue to occur several times a day.   She reports depression is "better, a lot better." She reports that since the pandemic she has not been as concerned about her appearance. Has been maintaining hygiene. She reports that she has been more motivated to make steps towards changing her eating to help with inflammation. Energy has been ok. Energy is improving after pneumonia. Appetite was decreased with pneumonia. Concentration has been improved. Increased enjoyment in things. Denies SI.   She reports that she has had several recent stressors with daughter. She reports that her daughter and granddaughter moved out in January.  She has been using an app called "10% happier." She has been listening to a podcast called, "we can do hard things." She has been playing these podcasts while she works.   Work stress has been less since change in position.   She reports that she has not needed Xanax prn during the daytime the last few months.   Past Medication Trials: Buspar-Effective Trintellix- memory difficulties Sertraline- Effective. Sexual side effects.  Cymbalta- memory difficulties Prozac-stomach pain Paxil Celexa Lexapro Remeron- Effective Wellbutrin XL- Irritability Lamictal-  ineffective Klonopin Xanax Propranolol Doxepin Trazodone- stopped working [employer]- More effective.  Deplin Lyrica- hypomania Gabapentin- sleep disturbance Amitriptyline-sleeplessness, ineffective.  PHQ2-9    Flowsheet Row Office Visit from 04/07/2021 in [facility] Medicine [facility] Office Visit from 03/11/2021 in [facility] Physical Medicine and Rehabilitation Office Visit from 02/03/2021 in [facility] Physical Medicine and Rehabilitation Office Visit from 07/09/2020 in [facility] Physical Medicine and Rehabilitation Office Visit from 02/27/2020 in [facility] Physical Medicine and Rehabilitation  PHQ-2 Total Score 6 0 0 0 0      Flowsheet Row ED from 07/22/2021 in [facility] ED from 01/15/2021 in [facility] from 10/14/2020 in [facility]  C-SSRS RISK CATEGORY No Risk No Risk No Risk        Review of Systems:  Review of Systems  HENT:  Positive for postnasal drip.   Respiratory:         Improved pneumonia s/s.   Genitourinary:        Has apt with gyn to evaluate hormone levels  Musculoskeletal:  Negative for gait problem.  Psychiatric/Behavioral:         Please refer to HPI   Medications: I have reviewed the patient's current medications.  Current Outpatient Medications  Medication Sig Dispense Refill   amitriptyline (ELAVIL) 10 MG tablet      apixaban (ELIQUIS) 5 MG TABS tablet Take 1 tablet (5 mg total) by mouth 2 (two) times daily. 180 tablet 1   Baclofen 5 MG TABS Take 1 tablet by mouth 3 (three) times daily as needed. 90 tablet 3   CALCIUM PO Take by mouth.  Cholecalciferol (VITAMIN D-3) 1000 units CAPS Take 5,000 Units by mouth daily.      fluticasone (FLONASE) 50 MCG/ACT nasal spray Place into both nostrils daily as needed.     L-Methylfolate-Algae (DEPLIN 15) 15-90.314 MG CAPS Take 1 tablet by mouth daily. 90 capsule 3   lisinopril  (PRINIVIL,ZESTRIL) 10 MG tablet TAKE 1 TABLET BY MOUTH EVERY DAY 34 tablet 2   Melatonin 3 MG CAPS Take by mouth.     methocarbamol (ROBAXIN) 750 MG tablet TAKE 1 TABLET(750 MG) BY MOUTH THREE TIMES DAILY 90 tablet 3   Multiple Vitamin (MULTIVITAMIN) tablet Take 1 tablet by mouth daily.     Multiple Vitamins-Minerals (ZINC PO) Take by mouth.     Omega-3 Fatty Acids (OMEGA 3 PO) Take by mouth.     Probiotic Product (PROBIOTIC PO) Take 1 capsule by mouth daily.      propranolol (INDERAL) 10 MG tablet Take 1-2 tabs po BID prn anxiety 120 tablet 1   traMADol (ULTRAM) 50 MG tablet Take 1 tablet (50 mg total) by mouth 3 (three) times daily as needed. 90 tablet 3   vitamin C (ASCORBIC ACID) 500 MG tablet Take 1,000 mg by mouth daily.      acetaminophen (TYLENOL) 500 MG tablet Take 1,000 mg by mouth every 8 (eight) hours as needed.      [START ON 10/23/2021] ALPRAZolam (XANAX) 0.5 MG tablet TAKE 1 TAB PO QHS AND 1/2-1 TABLET BY MOUTH TWICE DAILY AS NEEDED FOR ANXIETY 75 tablet 5   benzonatate (TESSALON) 100 MG capsule Take 1 capsule (100 mg total) by mouth every 8 (eight) hours. (Patient not taking: Reported on 08/12/2021) 21 capsule 0   busPIRone (BUSPAR) 30 MG tablet Take 1 tablet (30 mg total) by mouth 2 (two) times daily. 180 tablet 1   Estradiol 10 MCG TABS vaginal tablet Place 1 tablet vaginally 2 (two) times a week. (Patient not taking: Reported on 08/12/2021)     [START ON 10/09/2021] Lemborexant (DAYVIGO) 10 MG TABS Take 10 mg by mouth at bedtime. 30 tablet 2   levofloxacin (LEVAQUIN) 750 MG tablet Take 1 tablet (750 mg total) by mouth daily. X 7 days (Patient not taking: Reported on 08/12/2021) 7 tablet 0   sertraline (ZOLOFT) 100 MG tablet TAKE 2 TABLETS(200 MG) BY MOUTH AT BEDTIME 180 tablet 1   traZODone (DESYREL) 100 MG tablet Take 1/2-1 tablet po QHS prn insomnia 90 tablet 1   tretinoin (RETIN-A) 0.025 % cream  (Patient not taking: Reported on 08/12/2021)     No current facility-administered  medications for this visit.    Medication Side Effects: Other: Sexual side effects  Allergies:  Allergies  Allergen Reactions   Citalopram Tinitus   Escitalopram Tinitus   Bee Venom Swelling   Latex Swelling   Penicillins Swelling and Rash    Has patient had a PCN reaction causing immediate rash, facial/tongue/throat swelling, SOB or lightheadedness with hypotension: Yes Has patient had a PCN reaction causing severe rash involving mucus membranes or skin necrosis: No Has patient had a PCN reaction that required hospitalization: No Has patient had a PCN reaction occurring within the last 10 years: No If all of the above answers are "NO", then may proceed with Cephalosporin use.   Cymbalta [Duloxetine Hcl]     -memory loss   Escitalopram Oxalate Tinitus   Other     Yeast creams- causes secondary infections, itching, burning, redness    Progesterone     Pt feels caused weakness, fatigue,  cognitive deficits   Trintellix [Vortioxetine]     Memory problems   Bupropion Other (See Comments)    weakness and malaise.   Duloxetine Other (See Comments)   Gabapentin Other (See Comments)    Could not sleep.      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   Please see review of systems for further details on the patient's review from today.   Objective:   Physical Exam:  There were no vitals taken for this visit.  Physical Exam Constitutional:      General: She is not in acute distress. Musculoskeletal:         General: No deformity.  Neurological:     Mental Status: She is alert and oriented to person, place, and time.     Coordination: Coordination normal.  Psychiatric:        Attention and Perception: Attention and perception normal. She does not perceive auditory or visual hallucinations.        Mood and Affect: Mood is anxious. Mood is not depressed. Affect is not labile, blunt, angry or inappropriate.        Speech: Speech normal.        Behavior: Behavior normal.        Thought Content: Thought content normal. Thought content is not paranoid or delusional. Thought content does not include homicidal or suicidal ideation. Thought content does not include homicidal or suicidal plan.        Cognition and Memory: Cognition and memory normal.        Judgment: Judgment normal.     Comments: Insight intact    Lab Review:     Component Value Date/Time   NA 132 (L) 07/22/2021 1351   NA 134 (L) 01/05/2017 1359   K 3.0 (L) 07/22/2021 1351   K 3.9 01/05/2017 1359   CL 97 (L) 07/22/2021 1351   CO2 25 07/22/2021 1351   CO2 25 01/05/2017 1359   GLUCOSE 101 (H) 07/22/2021 1351   GLUCOSE 106 01/05/2017 1359   BUN 6 07/22/2021 1351   BUN 10.2 01/05/2017 1359   CREATININE 0.67 07/22/2021 1351   CREATININE 0.8 01/05/2017 1359   CALCIUM 9.2 07/22/2021 1351   CALCIUM 9.5 01/05/2017 1359   PROT 7.3 01/15/2021 1238   PROT 6.4 01/05/2017 1359   ALBUMIN 4.6 01/15/2021 1238   ALBUMIN 4.2 01/05/2017 1359   AST 25 01/15/2021 1238   AST 18 01/05/2017 1359   ALT 24 01/15/2021 1238   ALT 15 01/05/2017 1359   ALKPHOS 46 01/15/2021 1238   ALKPHOS 60 01/05/2017 1359   BILITOT 0.8 01/15/2021 1238   BILITOT 0.22 01/05/2017 1359   GFRNONAA >60 07/22/2021 1351   GFRAA >60 12/04/2016 1548       Component Value Date/Time   WBC 9.2 07/22/2021 1351   RBC 3.88 07/22/2021 1351   HGB 12.8 07/22/2021 1351   HGB 13.4 01/05/2017 1359  HCT 37.0 07/22/2021 1351   HCT 39.0 01/05/2017 1359   PLT 387 07/22/2021  1351   PLT 274 01/05/2017 1359   MCV 95.4 07/22/2021 1351   MCV 97.5 01/05/2017 1359   MCH 33.0 07/22/2021 1351   MCHC 34.6 07/22/2021 1351   RDW 12.4 07/22/2021 1351   RDW 12.4 01/05/2017 1359   LYMPHSABS 2.4 07/22/2021 1351   LYMPHSABS 3.0 01/05/2017 1359   MONOABS 1.0 07/22/2021 1351   MONOABS 0.6 01/05/2017 1359   EOSABS 0.1 07/22/2021 1351   EOSABS 0.1 01/05/2017 1359   BASOSABS 0.0 07/22/2021 1351   BASOSABS 0.0 01/05/2017 1359    No results found for: POCLITH, LITHIUM   No results found for: PHENYTOIN, PHENOBARB, VALPROATE, CBMZ   .res Assessment: Plan:   Pt seen for 30 minutes and time spent discussing treatment options since she reports possible sexual side effects/decreased sexual interest since around the time she started Sertraline. Discussed option of decreasing Sertraline or switching to Viibryd to improve possible sexual side effects. Plan is to continue Sertraline 200 mg daily at this time and to re-visit alternatives at next visit in 3 months.  Continue Buspar 30 mg po BID for anxiety.  Continue Xanax 0.5 mg po QHS and 1/2-1 tab po BID prn anxiety.  Continue Dayvigo 10 mg po QHS for insomnia.  Continue Deplin for augmentation of depression and MTHFR mutation.  Continue Propranolol 10 mg 1-2 tabs po BID prn anxiety.  Continue Trazodone 100 mg 1/2-1 tab po QHS prn insomnia.  Pt to follow-up in 3 months or sooner if clinically indicated.  Recommend continuing therapy.  Patient advised to contact office with any questions, adverse effects, or acute worsening in signs and symptoms.   Glada was seen today for anxiety and depression.  Diagnoses and all orders for this visit:  Generalized anxiety disorder -     ALPRAZolam (XANAX) 0.5 MG tablet; TAKE 1 TAB PO QHS AND 1/2-1 TABLET BY MOUTH TWICE DAILY AS NEEDED FOR ANXIETY -     busPIRone (BUSPAR) 30 MG tablet; Take 1 tablet (30 mg total) by mouth 2 (two) times daily. -     sertraline (ZOLOFT) 100 MG tablet; TAKE 2  TABLETS(200 MG) BY MOUTH AT BEDTIME  MDD (recurrent major depressive disorder) -     sertraline (ZOLOFT) 100 MG tablet; TAKE 2 TABLETS(200 MG) BY MOUTH AT BEDTIME  Primary insomnia -     traZODone (DESYREL) 100 MG tablet; Take 1/2-1 tablet po QHS prn insomnia  Other orders -     Lemborexant (DAYVIGO) 10 MG TABS; Take 10 mg by mouth at bedtime.     Please see After Visit Summary for patient specific instructions.  Future Appointments  Date Time Provider Department Center  09/26/2021 10:00 AM Raulkar, Drema Pry, MD [facility]  10/04/2021 11:20 AM Raulkar, Drema Pry, MD [facility]  11/07/2021 10:20 AM Carlis Abbott, Drema Pry, MD [facility]  11/11/2021 10:30 AM Corie Chiquito, PMHNP [facility] None  12/20/2021  1:00 PM Raulkar, Drema Pry, MD [facility]  03/23/2022 11:00 AM Raulkar, Drema Pry, MD [facility]    No orders of the defined types were placed in this encounter.   -------------------------------

## 2021-09-26 ENCOUNTER — Encounter
Payer: PRIVATE HEALTH INSURANCE | Attending: Physical Medicine and Rehabilitation | Admitting: Physical Medicine and Rehabilitation

## 2021-09-26 ENCOUNTER — Encounter: Payer: Self-pay | Admitting: Physical Medicine and Rehabilitation

## 2021-09-26 VITALS — BP 97/66 | HR 76 | Ht 65.0 in | Wt 155.0 lb

## 2021-09-26 DIAGNOSIS — M4807 Spinal stenosis, lumbosacral region: Secondary | ICD-10-CM | POA: Insufficient documentation

## 2021-09-26 NOTE — Progress Notes (Addendum)
? ?Subjective:  ? ? Patient ID: Faith Thomas, female    DOB: 03-Dec-1960, 61 y.o.   MRN: [id number] ? ?HPI  ?Faith Thomas is a 61 year old woman who presents for f/u of her fibromyalgia, facet arthropathy, myofascial pain, and lumbar spinal stenosis.  ? ?She received her radiofrequency nerve ablation from Dr. Wynn Banker on 7/22 of her left L5 dorsal ramus and left L3 and L4 medial branches. She had good benefit from this. We had discussed that this would help her back pain but not her leg pain. We tried Qutenza for her back pain and leg pain previously with good benefit. She would like to repeat today. Areas of pain today are left buttock and left side of thoracic and lumbar spine. She would like to try 2 Qutenza patches in this area and doTerra deep blue essential oil in between her upper thoracic shoulder blades.  ?-pain is overall stable ?-she is happy with current system of alternating trigger point injections and Qutenza treatments ?-she continues to be able to tolerate work and her daily yoga.  ?Pain is radiating into left leg, but to a lesser extend than it did before ?Last time we had done a 40 minute Qutenza appointment, discussed trying 30 minute appointment as this seems to be long enough to provide relief. ?Pain is overall better between her exercise, physical therapy, Qutenza, and trigger point injections.  ? ?She is on Eliquis (prescribed by her PCP, she does not see a cardiologist), which she stopped three days prior to prior ESI. She knows to have driver on the day of procedures.  ? ?She also has a painful thoracic paraspinal spasm on left side. She has greatly benefited from trigger points for this. This radiates down to her lumbar spine and she is not sure if it is connected to her lower back pain.  ? ?She has a There Cane and uses a soft foam roller and rolls on that regularly. She has a left sided thoracic parasponal muscle spasm. Ice helps decrease the pain. ? ?She would like to be able  to have less pain in order to do more activities with her 51 year old granddaughter.  ?  ?She is doing Noom, a weight loss app based on psychology. Being aware of emoitional eating, lost a little bit of weight. Her goal is to get down to 135. Height is 5'5."  ? ?She has been using ginger a fair amount. It caused a lot of gastric issues.  ? ?She has had very good benefit from trigger point injections.  ? ?She has been doing meditation, using the 10% Happier app, with good benefits. At times she does have a lot of fatigue.  ? ?She has lost 5 lbs in the past month! ? ?Average pain is 4/5, pain right now is 5/10. ? ?Pain Inventory ?Average Pain 4 ?Pain Right Now 5 ?My pain is sharp, burning, dull, stabbing, tingling and aching ? ?In the last 24 hours, has pain interfered with the following? ?General activity 4 ?Relation with others 3 ?Enjoyment of life 4 ?What TIME of day is your pain at its worst? varies ?Sleep (in general) Fair ? ?Pain is worse with: walking, bending, inactivity and standing ?Pain improves with: rest, heat/ice, therapy/exercise, medication and injections ?Relief from Meds: 6 ? ?Family History  ?Problem Relation Age of Onset  ? Arthritis Mother   ? Hypertension Mother   ? Mental illness Mother   ? Anxiety disorder Mother   ? Depression Mother   ?  Arthritis Father   ? Hypertension Father   ? Mental illness Father   ? Anxiety disorder Father   ? Depression Father   ? Mental illness Brother   ? ADD / ADHD Brother   ? Psychosis Brother   ? Post-traumatic stress disorder Brother   ? Suicidality Brother   ? Cancer Maternal Aunt   ? Autism Maternal Aunt   ? Depression Maternal Aunt   ? Arthritis Maternal Grandfather   ? Hypertension Maternal Grandmother   ? Depression Cousin   ? Anxiety disorder Cousin   ? Arthritis Daughter   ? Autism Daughter   ? ?Social History  ? ?Socioeconomic History  ? Marital status: Divorced  ?  Spouse name: Not on file  ? Number of children: Not on file  ? Years of education: Not on  file  ? Highest education level: Not on file  ?Occupational History  ? Not on file  ?Tobacco Use  ? Smoking status: Never  ? Smokeless tobacco: Never  ?Vaping Use  ? Vaping Use: Never used  ?Substance and Sexual Activity  ? Alcohol use: Yes  ?  Alcohol/week: 0.0 standard drinks  ?  Comment: occasional   ? Drug use: No  ? Sexual activity: Not on file  ?Other Topics Concern  ? Not on file  ?Social History Narrative  ? 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 ?Social Determinants of Health  ? ?Financial Resource Strain: Not on file  ?Food Insecurity: Not on file  ?Transportation Needs: Not on file  ?Physical Activity: Not on file  ?Stress: Not on file  ?Social Connections: Not on file  ? ?History reviewed. No pertinent surgical history. ?History reviewed. No pertinent surgical history. ?Past Medical History:  ?Diagnosis Date  ? Allergy   ? Depression   ? DVT (deep venous thrombosis) (HCC)   ? Fibromyalgia   ? Genetic defect   ? GERD (gastroesophageal reflux disease)   ? History of IBS   ? 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 Hypertension   ? Osteoporosis   ? PE (pulmonary thromboembolism) (HCC)   ? Positive TB test   ? Raynaud disease   ? Scoliosis   ? Urine incontinence   ? ?BP 97/66   Pulse 76   Ht 5\' 5"  (1.651 m)   Wt 155 lb (70.3 kg)   SpO2 96%   BMI 25.79 kg/m?  ? ?Opioid Risk Score:   ?Fall Risk Score:  `1 ? ?Depression screen PHQ 2/9 ? ? ?  09/26/2021  ? 10:04 AM 04/07/2021  ? 10:33 AM 03/11/2021  ?  2:26 PM 02/03/2021  ? 10:42 AM 07/09/2020  ?  9:43 AM 02/27/2020  ? 10:36 AM 04/15/2019  ? 11:14 AM  ?Depression screen PHQ 2/9  ?Decreased Interest 0 3 0 0 0 0 0  ?Down, Depressed, Hopeless 0 3 0 0 0 0 0  ?PHQ - 2 Score 0 6 0 0 0 0 0  ? ? ?Review of Systems  ?Constitutional: Negative.   ?HENT: Negative.    ?Eyes: Negative.   ?Respiratory: Negative.    ?Cardiovascular: Negative.   ?Gastrointestinal: Negative.   ?Endocrine: Negative.   ?Genitourinary: Negative.    ?Musculoskeletal: Negative.   ?Skin: Negative.   ?Allergic/Immunologic: Negative.   ?Neurological: Negative.   ?Hematological: Negative.   ?Psychiatric/Behavioral: Negative.    ?All other systems reviewed and are negative. ? ?   ?Objective:  ? Physical Exam ?Gen:  no distress, normal appearing, weight 155 lbs, BMI 25.79, BP 97/66 ?HEENT: oral mucosa pink and moist, NCAT ?Cardio: Reg rate ?Chest: normal effort, normal rate of breathing ?Abd: soft, non-distended ?Ext: no edema ?Skin: intact ?Neuro: Alert and oriented x3 ?Musculoskeletal: No longer with pain radiating down leg in L5 and S1 nerve root distribution! Full and painless flexion and extension. Left sided thoracic paraspinal and gluteal trigger points. ?Psych: pleasant, normal affect ?   ?Assessment & Plan:  ?1) Lumbar spondylosis with neurogenic claudication, left side ?2) Bilateral facet joint arthritis, lumbar ?3) left sided thoracic paraspinal spasm ?  ?-Faith Thomas chief complaint today is left sided thoracic and gluteal pain. Her left sided sciatica in the L5 and S1 distribution resolved with prior Qutenza treatment! On physical exam, she has painless FROM on flexion with her bilateral low back pain. On last visit this was reproduced with extension but currently she has full and painless extension. Her pain has been most consistent with left L5 and S1 nerve root impingement secondary to stenosis and lower back pain secondary to facet arthropathy, which is seen on her most recent MRI, which shows worsening facet arthropathy as well as left sided annular fissure; results reviewed and discussed with patient previously. She would benefit and is agreeable to a course of physical therapy focused on core strengthening. She would like to hold off on starting this until COVID-19 cases have reduced. For now, she will continue her daily yoga practice (she performed 2 hours today and does so regularly), which has been shown to help low back pain and which  helps her.  ?-Discussed current symptoms of pain and history of pain.  ?-Discussed benefits of exercise in reducing pain. ?-Discussed following foods that may reduce pain: ?1) Ginger (especially studied for

## 2021-09-26 NOTE — Patient Instructions (Signed)
[email or web address] ?

## 2021-10-04 ENCOUNTER — Encounter
Payer: PRIVATE HEALTH INSURANCE | Attending: Physical Medicine and Rehabilitation | Admitting: Physical Medicine and Rehabilitation

## 2021-10-04 DIAGNOSIS — M4807 Spinal stenosis, lumbosacral region: Secondary | ICD-10-CM | POA: Insufficient documentation

## 2021-11-07 ENCOUNTER — Ambulatory Visit: Payer: PRIVATE HEALTH INSURANCE | Admitting: Physical Medicine and Rehabilitation

## 2021-11-08 ENCOUNTER — Ambulatory Visit: Payer: PRIVATE HEALTH INSURANCE | Admitting: Physical Medicine and Rehabilitation

## 2021-11-11 ENCOUNTER — Ambulatory Visit: Payer: PRIVATE HEALTH INSURANCE | Admitting: Psychiatry

## 2021-11-28 ENCOUNTER — Encounter: Payer: Self-pay | Admitting: Physical Medicine and Rehabilitation

## 2021-11-28 ENCOUNTER — Encounter
Payer: PRIVATE HEALTH INSURANCE | Attending: Physical Medicine and Rehabilitation | Admitting: Physical Medicine and Rehabilitation

## 2021-11-28 VITALS — BP 112/76 | HR 74 | Ht 65.0 in | Wt 149.0 lb

## 2021-11-28 DIAGNOSIS — M7918 Myalgia, other site: Secondary | ICD-10-CM | POA: Insufficient documentation

## 2021-11-28 NOTE — Progress Notes (Signed)
Trigger Point Injection  Indication: Myofascial pain not relieved by medication management and other conservative care.  Informed consent was obtained after describing risk and benefits of the procedure with the patient, this includes bleeding, bruising, infection and medication side effects.  The patient wishes to proceed and has given written consent.  The patient was placed in a seated position.  The area of pain was marked and prepped with Betadine.  It was entered with a 25-gauge 1/2 inch needle and a total of 5 mL of 1% lidocaine and normal saline was injected into a total of 10 trigger points, after negative draw back for blood.  The patient tolerated the procedure well.  Post procedure instructions were given.

## 2021-12-01 ENCOUNTER — Ambulatory Visit (INDEPENDENT_AMBULATORY_CARE_PROVIDER_SITE_OTHER): Payer: PRIVATE HEALTH INSURANCE | Admitting: Psychiatry

## 2021-12-01 ENCOUNTER — Encounter: Payer: Self-pay | Admitting: Psychiatry

## 2021-12-01 DIAGNOSIS — Z1589 Genetic susceptibility to other disease: Secondary | ICD-10-CM

## 2021-12-01 DIAGNOSIS — F411 Generalized anxiety disorder: Secondary | ICD-10-CM

## 2021-12-01 DIAGNOSIS — F3340 Major depressive disorder, recurrent, in remission, unspecified: Secondary | ICD-10-CM | POA: Diagnosis not present

## 2021-12-01 MED ORDER — L-METHYLFOLATE 15 MG PO TABS: 15.0000 mg | ORAL_TABLET | Freq: Every day | ORAL | 3 refills | Status: DC

## 2021-12-01 MED ORDER — BUSPIRONE HCL 30 MG PO TABS: 30.0000 mg | ORAL_TABLET | Freq: Two times a day (BID) | ORAL | 1 refills | Status: DC

## 2021-12-01 MED ORDER — SERTRALINE HCL 100 MG PO TABS: ORAL_TABLET | ORAL | 1 refills | Status: DC

## 2021-12-01 NOTE — Patient Instructions (Addendum)
-  Stop Mirtazapine (Remeron) since this may contribute to increased appetite and weight gain.   -Will re-start Dayvigo 10 mg at bedtime since this seemed to helpful for insomnia in the past. Please call your pharmacy and request that they fill Dayvigo since there should be scripts on file. You may want to download a copay savings card from [email or web address]   -Try taking Trazodone as needed only to determine if Dayvigo is effective alone for insomnia. It's ok to use Trazodone in combination with Dayvigo on nights that anxiety and/or stress are higher.   -A script for L-methylfolate (generic deplin) was sent to [facility] since this should be more affordable with the Good Rx discount (about $82 for a 90 day supply).   - Continue all other medications without changes.

## 2021-12-01 NOTE — Progress Notes (Signed)
Faith Thomas [id number] May 08, 1961 61 y.o.  Subjective:   Patient ID:  Faith Thomas is a 61 y.o. (DOB [birth year]) female.  Chief Complaint:  Chief Complaint  Patient presents with   Sleeping Problem   Anxiety   Depression    HPI Faith Thomas presents to the office today for follow-up of anxiety, depression, and insomnia.   Faith Thomas reports "things have been calmer." Faith Thomas reports that generalized anxiety has improved and Faith Thomas is now noticing low level panic attacks. Faith Thomas reports that severe anxiety is often triggered by work-related stress, ie. Some confusion with licensure documents, organizational changes at work, etc. Had more panic in the last month since there were certain triggers. Faith Thomas reports that Faith Thomas had full blown panic attacks with both of these triggers and had some shallow breathing, sweating, heart pounding, racing thoughts, etc.   Faith Thomas reports that her mood has been "mostly good" with occasional sadness. Reports that her physical health has improved some and this has helped her mood. Some depression after learning that Faith Thomas will need to work longer than Faith Thomas had expected before retirement. Faith Thomas reports that Faith Thomas can occasionally fall asleep easily and other times it may take a couple of hours to fall asleep. Waking up at least twice during the night and sometimes 4-5 middle of the night awakenings. Planning to do a sleep study. Appetite has been ok and higher than Faith Thomas would like. Reports some intentional weight loss. Concentration is ok most of the time unless anxiety is elevated. Denies SI.   Faith Thomas notices some seasonal affective symptoms and that her energy and motivation is higher in the summer. Faith Thomas is trying to establish some habits in the summer to carry over into the winter.  Stopped having alcohol at home. Trying to increase walking.  Continues to work remotely.   Typically not taking Xanax prn during the day. Taking 1-1.5 Xanax 0.5 mg tabs at night.    Past  Medication Trials: Buspar-Effective Trintellix- memory difficulties Sertraline- Effective. Sexual side effects.  Cymbalta- memory difficulties Prozac-stomach pain Paxil Celexa Lexapro Remeron- Effective Wellbutrin XL- Irritability Lamictal- ineffective Klonopin Xanax Propranolol Doxepin Trazodone- stopped working [employer] Deplin Lyrica- hypomania Gabapentin- sleep disturbance Amitriptyline-sleeplessness, ineffective.   PHQ2-9    Flowsheet Row Office Visit from 11/28/2021 in [facility] Office Visit from 09/26/2021 in [facility] Physical Medicine and Rehabilitation Office Visit from 04/07/2021 in [facility] Rehabilitation Office Visit from 03/11/2021 in [facility] Physical Medicine and Rehabilitation Office Visit from 02/03/2021 in [facility] Physical Medicine and Rehabilitation  PHQ-2 Total Score 0 0 6 0 0      Flowsheet Row ED from 07/22/2021 in [facility] ED from 01/15/2021 in [facility] from 10/14/2020 in [facility]  C-SSRS RISK CATEGORY No Risk No Risk No Risk        Review of Systems:  Review of Systems  Gastrointestinal:        Improved GERD  Musculoskeletal:  Negative for gait problem.  Neurological:  Negative for tremors.  Psychiatric/Behavioral:         Please refer to HPI    Medications: I have reviewed the patient's current medications.  Current Outpatient Medications  Medication Sig Dispense Refill   acetaminophen (TYLENOL) 500 MG tablet Take 1,000 mg by mouth every 8 (eight) hours as needed.      alendronate (FOSAMAX) 70 MG tablet Take by mouth.  ALPRAZolam (XANAX) 0.5 MG tablet TAKE 1 TAB PO QHS AND 1/2-1 TABLET BY MOUTH TWICE DAILY AS NEEDED FOR ANXIETY 75 tablet 5   apixaban (ELIQUIS) 5 MG TABS tablet Take 1 tablet (5 mg total) by mouth 2 (two) times daily. 180 tablet 1   B  Complex Vitamins (B COMPLEX PO) Take by mouth.     Baclofen 5 MG TABS Take 1 tablet by mouth 3 (three) times daily as needed. 90 tablet 3   CALCIUM PO Take by mouth.     Cholecalciferol (VITAMIN D3) 10 MCG (400 UNIT) CAPS Vitamin D3     Estradiol 10 MCG TABS vaginal tablet Place 1 tablet vaginally 2 (two) times a week.     fluticasone (FLONASE) 50 MCG/ACT nasal spray Place into both nostrils daily as needed.     L-Methylfolate 15 MG TABS Take 1 tablet (15 mg total) by mouth daily. 90 tablet 3   lisinopril (ZESTRIL) 10 MG tablet Take 1 tablet by mouth daily.     Melatonin 3 MG CAPS Take by mouth.     methocarbamol (ROBAXIN) 750 MG tablet TAKE 1 TABLET(750 MG) BY MOUTH THREE TIMES DAILY 90 tablet 3   Multiple Vitamin (MULTIVITAMIN) tablet Take 1 tablet by mouth daily.     Omega-3 Fatty Acids (OMEGA 3 PO) Take by mouth.     Probiotic Product (PROBIOTIC PO) Take 1 capsule by mouth daily.      traMADol (ULTRAM) 50 MG tablet Take 1 tablet (50 mg total) by mouth 3 (three) times daily as needed. 90 tablet 3   traZODone (DESYREL) 100 MG tablet Take 1/2-1 tablet po QHS prn insomnia 90 tablet 1   vitamin C (ASCORBIC ACID) 500 MG tablet Take 1,000 mg by mouth daily.      albuterol (VENTOLIN HFA) 108 (90 Base) MCG/ACT inhaler SMARTSIG:1-2 Puff(s) By Mouth Every 4-6 Hours PRN (Patient not taking: Reported on 12/01/2021)     busPIRone (BUSPAR) 30 MG tablet Take 1 tablet (30 mg total) by mouth 2 (two) times daily. 180 tablet 1   Lemborexant (DAYVIGO) 10 MG TABS Take 10 mg by mouth at bedtime. (Patient not taking: Reported on 12/01/2021) 30 tablet 2   sertraline (ZOLOFT) 100 MG tablet TAKE 2 TABLETS(200 MG) BY MOUTH AT BEDTIME 180 tablet 1   No current facility-administered medications for this visit.    Medication Side Effects: None  Allergies:  Allergies  Allergen Reactions   Citalopram Tinitus    Other reaction(s): Unknown   Escitalopram Tinitus    Other reaction(s): Unknown   Bee Venom Swelling    Latex Swelling    Other reaction(s): Unknown   Penicillins Swelling and Rash    Has patient had a PCN reaction causing immediate rash, facial/tongue/throat swelling, SOB or lightheadedness with hypotension: Yes Has patient had a PCN reaction causing severe rash involving mucus membranes or skin necrosis: No Has patient had a PCN reaction that required hospitalization: No Has patient had a PCN reaction occurring within the last 10 years: No If all of the above answers are "NO", then may proceed with Cephalosporin use.   Duloxetine Hcl     -memory loss Other reaction(s): Unknown   Escitalopram Oxalate Tinitus   Other     Yeast creams- causes secondary infections, itching, burning, redness    Penicillin G     Other reaction(s): Unknown   Progesterone     Pt feels caused weakness, fatigue, cognitive deficits Other reaction(s): Unknown   Vortioxetine  Memory problems Other reaction(s): Unknown   Bupropion Other (See Comments)    weakness and malaise. Other reaction(s): Unknown   Duloxetine Other (See Comments)   Gabapentin Other (See Comments)    Could not sleep.      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   Please see review of systems for further details on the patient's review from today.   Objective:   Physical Exam:  There were no vitals taken for this visit.  Physical Exam Constitutional:      General: Faith Thomas is not in acute distress. Musculoskeletal:        General: No deformity.   Neurological:     Mental Status: Faith Thomas is alert and oriented to person, place, and time.     Coordination: Coordination normal.  Psychiatric:        Attention and Perception: Attention and perception normal. Faith Thomas does not perceive auditory or visual hallucinations.        Mood and Affect: Mood is anxious. Affect is not labile, blunt, angry or inappropriate.        Speech: Speech normal.        Behavior: Behavior normal.        Thought Content: Thought content normal. Thought content is not paranoid or delusional. Thought content does not include homicidal or suicidal ideation. Thought content does not include homicidal or suicidal plan.        Cognition and Memory: Cognition and memory normal.        Judgment: Judgment normal.     Comments: Insight intact Sadness in response to losses     Lab Review:     Component Value Date/Time   NA 132 (L) 07/22/2021 1351   NA 134 (L) 01/05/2017 1359   K 3.0 (L) 07/22/2021 1351   K 3.9 01/05/2017 1359   CL 97 (L) 07/22/2021 1351   CO2 25 07/22/2021 1351   CO2 25 01/05/2017 1359   GLUCOSE 101 (H) 07/22/2021 1351   GLUCOSE 106 01/05/2017 1359   BUN 6 07/22/2021 1351   BUN 10.2 01/05/2017 1359   CREATININE 0.67 07/22/2021 1351   CREATININE 0.8 01/05/2017 1359   CALCIUM 9.2 07/22/2021 1351   CALCIUM 9.5 01/05/2017 1359   PROT 7.3 01/15/2021 1238   PROT 6.4 01/05/2017 1359   ALBUMIN 4.6 01/15/2021 1238   ALBUMIN 4.2 01/05/2017 1359   AST 25 01/15/2021 1238   AST 18 01/05/2017 1359   ALT 24 01/15/2021 1238   ALT 15 01/05/2017 1359   ALKPHOS 46 01/15/2021 1238   ALKPHOS 60 01/05/2017 1359   BILITOT 0.8 01/15/2021 1238   BILITOT 0.22 01/05/2017 1359   GFRNONAA >60 07/22/2021 1351   GFRAA >60 12/04/2016 1548       Component Value Date/Time   WBC 9.2 07/22/2021 1351   RBC 3.88 07/22/2021 1351   HGB 12.8 07/22/2021 1351   HGB 13.4 01/05/2017 1359   HCT  37.0 07/22/2021 1351   HCT 39.0 01/05/2017 1359   PLT 387 07/22/2021 1351   PLT  274 01/05/2017 1359   MCV 95.4 07/22/2021 1351   MCV 97.5 01/05/2017 1359   MCH 33.0 07/22/2021 1351   MCHC 34.6 07/22/2021 1351   RDW 12.4 07/22/2021 1351   RDW 12.4 01/05/2017 1359   LYMPHSABS 2.4 07/22/2021 1351   LYMPHSABS 3.0 01/05/2017 1359   MONOABS 1.0 07/22/2021 1351   MONOABS 0.6 01/05/2017 1359   EOSABS 0.1 07/22/2021 1351   EOSABS 0.1 01/05/2017 1359   BASOSABS 0.0 07/22/2021 1351   BASOSABS 0.0 01/05/2017 1359    No results found for: "POCLITH", "LITHIUM"   No results found for: "PHENYTOIN", "PHENOBARB", "VALPROATE", "CBMZ"   .res Assessment: Plan:    Pt seen for 45 minutes and time spent discussing treatment plan. Will discontinue Remeron due to possible weight gain and to minimize polypharmacy. Discussed that l-methylfolate is available in generic and Faith Thomas reports that Faith Thomas would like to switch from brand name Deplin to L-methylfolate 15 mg po qd.  Will re-start Dayvigo since this was helpful for her insomnia in the past and inadvertently left it off. Will continue Buspar 30 mg po BID for anxiety.  Continue Sertraline 200 mg po qd for anxiety and depression. Continue Xanax 0.5 mg po QHS and 1/2-1 tab po BID prn anxiety.  Continue Trazodone 100 mg 1/2-1 tab po QHS prn insomnia.  Pt to follow-up in 3 months or sooner if clinically indicated.  Patient advised to contact office with any questions, adverse effects, or acute worsening in signs and symptoms.   Faith Thomas was seen today for sleeping problem, anxiety and depression.  Diagnoses and all orders for this visit:  MDD (recurrent major depressive disorder) -     L-Methylfolate 15 MG TABS; Take 1 tablet (15 mg total) by mouth daily. -     sertraline (ZOLOFT) 100 MG tablet; TAKE 2 TABLETS(200 MG) BY MOUTH AT BEDTIME  Homozygous for MTHFR gene mutation -     L-Methylfolate 15 MG TABS; Take 1 tablet (15 mg total) by mouth daily.  Generalized anxiety disorder -     sertraline (ZOLOFT) 100 MG tablet; TAKE 2 TABLETS(200  MG) BY MOUTH AT BEDTIME -     busPIRone (BUSPAR) 30 MG tablet; Take 1 tablet (30 mg total) by mouth 2 (two) times daily.     Please see After Visit Summary for patient specific instructions.  Future Appointments  Date Time Provider Department Center  12/13/2021  9:20 AM Raulkar, Drema Pry, MD [facility]  01/19/2022 11:45 AM Raulkar, Drema Pry, MD [facility]  03/03/2022  1:30 PM Corie Chiquito, PMHNP [facility] None  03/09/2022 11:00 AM Raulkar, Drema Pry, MD [facility]  03/23/2022 11:00 AM Carlis Abbott, Drema Pry, MD [facility]  04/20/2022  9:30 AM Carlis Abbott, Drema Pry, MD [facility]  06/19/2022 10:00 AM Raulkar, Drema Pry, MD [facility]    No orders of the defined types were placed in this encounter.   -------------------------------

## 2021-12-02 ENCOUNTER — Other Ambulatory Visit: Payer: Self-pay | Admitting: Physical Medicine and Rehabilitation

## 2021-12-02 DIAGNOSIS — M7918 Myalgia, other site: Secondary | ICD-10-CM

## 2021-12-02 DIAGNOSIS — M4807 Spinal stenosis, lumbosacral region: Secondary | ICD-10-CM

## 2021-12-08 ENCOUNTER — Telehealth: Payer: Self-pay

## 2021-12-08 NOTE — Telephone Encounter (Signed)
Prior Authorization initiated through [employer]benef[employer], [email or web address] for Dayvigo 10 mg EOC # [id number]  Pending response

## 2021-12-08 NOTE — Telephone Encounter (Signed)
Faxed updated information about previously tried medications per request

## 2021-12-09 NOTE — Telephone Encounter (Signed)
Prior approval received effective 12/09/2021-12/09/2022 with RX Benefits for Dayvigo 10 mg  Faxed approval to her pharmacy

## 2021-12-13 ENCOUNTER — Encounter
Payer: PRIVATE HEALTH INSURANCE | Attending: Physical Medicine and Rehabilitation | Admitting: Physical Medicine and Rehabilitation

## 2021-12-13 ENCOUNTER — Ambulatory Visit: Payer: PRIVATE HEALTH INSURANCE | Admitting: Physical Medicine and Rehabilitation

## 2021-12-13 ENCOUNTER — Encounter: Payer: Self-pay | Admitting: Physical Medicine and Rehabilitation

## 2021-12-13 VITALS — Ht 65.0 in | Wt 150.0 lb

## 2021-12-13 DIAGNOSIS — M7918 Myalgia, other site: Secondary | ICD-10-CM | POA: Diagnosis not present

## 2021-12-13 DIAGNOSIS — G629 Polyneuropathy, unspecified: Secondary | ICD-10-CM | POA: Diagnosis not present

## 2021-12-13 DIAGNOSIS — M48062 Spinal stenosis, lumbar region with neurogenic claudication: Secondary | ICD-10-CM | POA: Diagnosis not present

## 2021-12-13 NOTE — Progress Notes (Signed)
Subjective:    Patient ID: Faith Thomas, female    DOB: 11/17/1960, 61 y.o.   MRN: [id number]   Faith Thomas is a 61 year old woman who presents for f/u of her fibromyalgia, facet arthropathy, myofascial pain, and lumbar spinal stenosis.   She received her radiofrequency nerve ablation from Dr. Wynn Banker on 7/22 of her left L5 dorsal ramus and left L3 and L4 medial branches. She had good benefit from this. We had discussed that this would help her back pain but not her leg pain. We tried Qutenza for her back pain and leg pain previously with good benefit. She would like to repeat today. Areas of pain today are left buttock and left side of thoracic and lumbar spine. She would like to try 2 Qutenza patches in this area and doTerra deep blue essential oil in between her upper thoracic shoulder blades.  -pain has worsened a little from prior but is overall much better than it was in the past, she notes that she is not sure whether it is the trigger point injections, the Qutneza patches, the physical therapy, or the yoga that is most helping her, or a combination of these.  -she is happy with current system of alternating trigger point injections and Qutenza treatments -she continues to be able to tolerate work and her daily yoga.  Pain is radiating into left leg, but to a lesser extend than it did before Last time we had done a 40 minute Qutenza appointment, discussed trying 30 minute appointment as this seems to be long enough to provide relief. Pain is overall better between her exercise, physical therapy, Qutenza, and trigger point injections.   She is on Eliquis (prescribed by her PCP, she does not see a cardiologist), which she stopped three days prior to prior ESI. She knows to have driver on the day of procedures.   She also has a painful thoracic paraspinal spasm on left side. She has greatly benefited from trigger points for this. This radiates down to her lumbar spine and she is  not sure if it is connected to her lower back pain.   She has a There Cane and uses a soft foam roller and rolls on that regularly. She has a left sided thoracic parasponal muscle spasm. Ice helps decrease the pain.  She would like to be able to have less pain in order to do more activities with her 42 year old granddaughter.    She is doing Noom, a weight loss app based on psychology. Being aware of emoitional eating, lost a little bit of weight. Her goal is to get down to 135. Height is 5'5."   She has been using ginger a fair amount. It caused a lot of gastric issues.   She has had very good benefit from trigger point injections.   She has been doing meditation, using the 10% Happier app, with good benefits. At times she does have a lot of fatigue.   She has lost 5 lbs in the past month!  Average pain is 4/5, pain right now is 5/10.  Pain Inventory Average Pain 4 Pain Right Now 5 My pain is sharp, burning, dull, stabbing, tingling and aching  In the last 24 hours, has pain interfered with the following? General activity 4 Relation with others 3 Enjoyment of life 4 What TIME of day is your pain at its worst? varies Sleep (in general) Fair  Pain is worse with: walking, bending, inactivity and standing Pain  improves with: rest, heat/ice, therapy/exercise, medication and injections Relief from Meds: 6  Family History  Problem Relation Age of Onset   Arthritis Mother    Hypertension Mother    Mental illness Mother    Anxiety disorder Mother    Depression Mother    Arthritis Father    Hypertension Father    Mental illness Father    Anxiety disorder Father    Depression Father    Mental illness Brother    ADD / ADHD Brother    Psychosis Brother    Post-traumatic stress disorder Brother    Suicidality Brother    Cancer Maternal Aunt    Autism Maternal Aunt    Depression Maternal Aunt    Arthritis Maternal Grandfather    Hypertension Maternal Grandmother    Depression  Cousin    Anxiety disorder Cousin    Arthritis Daughter    Autism Daughter    Social History   Socioeconomic History   Marital status: Divorced    Spouse name: Not on file   Number of children: Not on file   Years of education: Not on file   Highest education level: Not on file  Occupational History   Not on file  Tobacco Use   Smoking status: Never   Smokeless tobacco: Never  Vaping Use   Vaping Use: Never used  Substance and Sexual Activity   Alcohol use: Yes    Alcohol/week: 0.0 standard drinks of alcohol    Comment: occasional    Drug use: No   Sexual activity: Not on file  Other Topics Concern   Not on file  Social History Narrative   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 No pertinent surgical history. History reviewed. No pertinent surgical history. 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   Hypertension    Osteoporosis    PE (pulmonary thromboembolism) (HCC)    Positive TB test    Raynaud disease    Scoliosis    Urine incontinence    Ht 5\' 5"  (1.651 m)   Wt 150 lb (68 kg)   BMI 24.96 kg/m   Opioid Risk Score:   Fall Risk Score:  `1  Depression screen [facility] 2/9     12/13/2021    9:16 AM 11/28/2021    3:17 PM 09/26/2021   10:04 AM 04/07/2021   10:33 AM 03/11/2021    2:26 PM 02/03/2021   10:42 AM 07/09/2020    9:43 AM  Depression screen PHQ 2/9  Decreased Interest 0 0 0 3 0 0 0  Down, Depressed, Hopeless 0 0 0 3 0 0 0  PHQ - 2 Score 0 0 0 6 0 0 0    Review  of Systems  Constitutional: Negative.   HENT: Negative.    Eyes: Negative.   Respiratory: Negative.    Cardiovascular: Negative.   Gastrointestinal: Negative.   Endocrine: Negative.   Genitourinary: Negative.  Musculoskeletal: Negative.   Skin: Negative.   Allergic/Immunologic: Negative.   Neurological: Negative.   Hematological: Negative.   Psychiatric/Behavioral: Negative.    All other systems reviewed and are negative.      Objective:   Physical Exam Gen: no distress, normal appearing, weight 155 lbs, BMI 25.79, BP 97/66 HEENT: oral mucosa pink and moist, NCAT Cardio: Reg rate Chest: normal effort, normal rate of breathing Abd: soft, non-distended Ext: no edema Skin: intact, improved musculature, no evidence of open lesions or sunburn. Neuro: Alert and oriented x3 Musculoskeletal: No longer with pain radiating down leg in L5 and S1 nerve root distribution! Full and painless flexion and extension. Left sided thoracic paraspinal and gluteal trigger points. Psych: pleasant, normal affect    Assessment & Plan:  1) Lumbar spondylosis with neurogenic claudication, left side 2) Bilateral facet joint arthritis, lumbar 3) left sided thoracic paraspinal spasm   -Mrs. Ferns's chief complaint today is left sided thoracic and gluteal pain. Her left sided sciatica in the L5 and S1 distribution resolved with prior Qutenza treatment! On physical exam, she has painless FROM on flexion with her bilateral low back pain. On last visit this was reproduced with extension but currently she has full and painless extension. Her pain has been most consistent with left L5 and S1 nerve root impingement secondary to stenosis and lower back pain secondary to facet arthropathy, which is seen on her most recent MRI, which shows worsening facet arthropathy as well as left sided annular fissure; results reviewed and discussed with patient previously. She would benefit and is agreeable to a course of  physical therapy focused on core strengthening. She would like to hold off on starting this until COVID-19 cases have reduced. For now, she will continue her daily yoga practice (she performed 2 hours today and does so regularly), which has been shown to help low back pain and which helps her.  -Discussed current symptoms of pain and history of pain.  -Discussed benefits of exercise in reducing pain. -Discussed following foods that may reduce pain: 1) Ginger (especially studied for arthritis)- reduce leukotriene production to decrease inflammation 2) Blueberries- high in phytonutrients that decrease inflammation 3) Salmon- marine omega-3s reduce joint swelling and pain 4) Pumpkin seeds- reduce inflammation 5) dark chocolate- reduces inflammation 6) turmeric- reduces inflammation 7) tart cherries - reduce pain and stiffness 8) extra virgin olive oil - its compound olecanthal helps to block prostaglandins  9) chili peppers- can be eaten or applied topically via capsaicin 10) mint- helpful for headache, muscle aches, joint pain, and itching 11) garlic- reduces inflammation  Link to further information on diet for chronic pain: http://[email or web address]://[email or web address]/diet-patients-[email or web address]-pain   -Good benefit from left thoracic paraspinal trigger point injections in the past  -She had good benefit from radiofrequency nerve ablation with Dr. Wynn BankerKirsteins of left L5 dorsal ramus and left L3 and L4 medial branches. We educated her that this would not help with her leg pain and now she would like to address this. Has also had temporary relief from left L5 and S1 ESI and full relief since last Qutenza administration.    -Continue Robaxin to 750mg  TID and Tramadol 50mg  TID PRN which are working for her and which she is tolerating without side effects, on an as needed basis.   -Discussed Qutenza as an option for neuropathic pain control. Discussed that this is a capsaicin patch,  stronger than capsaicin cream. Discussed that it is currently approved for diabetic peripheral neuropathy and post-herpetic neuralgia, but that it  has also shown benefit in treating other forms of neuropathy. Provided patient with link to site to learn more about the patch: https://www.clark.biz/. Discussed that the patch would be placed in office and benefits usually last 3 months. Discussed that unintended exposure to capsaicin can cause severe irritation of eyes, mucous membranes, respiratory tract, and skin, but that Qutenza is a local treatment and does not have the systemic side effects of other nerve medications. Discussed that there may be pain, itching, erythema, and decreased sensory function associated with the application of Qutenza. Side effects usually subside within 1 week. A cold pack of analgesic medications can help with these side effects. Blood pressure can also be increased due to pain associated with administration of the patch.   2 patches of Qutenza was applied to the area of pain. Ice packs were applied during the procedure to ensure patient comfort. Blood pressure was monitored every 15 minutes. The patient tolerated the procedure well. Post-procedure instructions were given and follow-up has been scheduled.     4) Fibromyalgia with myalgias -Patient's symptoms have greatly improved with her change in job, allowing her to sleep better. Continue daily yoga and meditation for stress relief. Discussed the goal of increasing her aerobic exercise to 15 minutes of daily outdoor walking for now.  -Recommended continuing NAC (N-Acetyl cysteine) 600mg  BID for her fatigue. Discussed its benefits in boosting glutathione and mitochondrial function. -continue fibromyalgia relief journal -Provided with a pain relief journal and discussed that it contains foods and lifestyle tips to naturally help to improve pain. Discussed that these lifestyle strategies are also very good for health unlike  some medications which can have negative side effects. Discussed that the act of keeping a journal can be therapeutic and helpful to realize patterns what helps to trigger and alleviate pain.   -recommended doTerra Deep Blue Essential oil and applied to area of pain today- discussed that this is made of natural plant oils. Shared by personal experience of benefit from use of this essential oil -provided a link to a pdf of Pete Escogue's musculoskeletal alignment exercises -continue trigger point injections every 6 weeks.    5) Osteopenia: Counseled regarding supplements, diet, and exercise to improve bone density. She is also following with rheumatology and on Fosfomax, as well as with a dietician. She has been following diet plan well. Advised regarding risk of corticosteroids when osteopenic.    6) Hypotension: BP recently 112/82. Advised that she log her BP daily and bring reads to follow-up appointment with me or PCP so medication can be appropriately adjusted.  -Advised regarding healthy foods that can help lower blood pressure and provided with a list: 1) citrus foods- high in vitamins and minerals 2) salmon and other fatty fish - reduces inflammation and oxylipins 3) swiss chard (leafy green)- high level of nitrates 4) pumpkin seeds- one of the best natural sources of magnesium 5) Beans and lentils- high in fiber, magnesium, and potassium 6) Berries- high in flavonoids 7) Amaranth (whole grain, can be cooked similarly to rice and oats)- high in magnesium and fiber 8) Pistachios- even more effective at reducing BP than other nuts 9) Carrots- high in phenolic compounds that relax blood vessels and reduce inflammation 10) Celery- contain phthalides that relax tissues of arterial walls 11) Tomatoes- can also improve cholesterol and reduce risk of heart disease 12) Broccoli- good source of magnesium, calcium, and potassium 13) Greek yogurt: high in potassium and calcium 14) Herbs and spices:  Celery seed, cilantro, saffron, lemongrass,  black cumin, ginseng, cinnamon, cardamom, sweet basil, and ginger 15) Chia and flax seeds- also help to lower cholesterol and blood sugar 16) Beets- high levels of nitrates that relax blood vessels  17) spinach and bananas- high in potassium  -Provided lise of supplements that can help with hypertension:  1) magnesium: one high quality brand is Bioptemizers since it contains all 7 types of magnesium, otherwise over the counter magnesium gluconate 400mg  is a good option 2) B vitamins 3) vitamin D 4) potassium 5) CoQ10 6) L-arginine 7) Vitamin C 8) Beetroot -Educated that goal BP is 120/80. -Made goal to incorporate some of the above foods into diet.    7) General health: provided dietary counseling.   --Follow up in 1 months for continued management of above conditions.    8) Thoracic myofascial pain: good benefit with trigger point injections, repeat in 6 weeks and schedule for every 3 months throughout the year  9) Overweight BMI 25.79: -Educated that current weight is 155lbs -Educated regarding health benefits of weight loss- for pain, general health, chronic disease prevention, immune health, mental health.  -Will monitor weight every visit.  -Consider Roobois tea daily.  -Discussed the benefits of intermittent fasting. -Discussed foods that can assist in weight loss: 1) leafy greens- high in fiber and nutrients 2) dark chocolate- improves metabolism (if prefer sweetened, best to sweeten with honey instead of sugar).  3) cruciferous vegetables- high in fiber and protein 4) full fat yogurt: high in healthy fat, protein, calcium, and probiotics 5) apples- high in a variety of phytochemicals 6) nuts- high in fiber and protein that increase feelings of fullness 7) grapefruit: rich in nutrients, antioxidants, and fiber (not to be taken with anticoagulation) 8) beans- high in protein and fiber 9) salmon- has high quality protein and  healthy fats 10) green tea- rich in polyphenols 11) eggs- rich in choline and vitamin D 12) tuna- high protein, boosts metabolism 13) avocado- decreases visceral abdominal fat 14) chicken (pasture raised): high in protein and iron 15) blueberries- reduce abdominal fat and cholesterol 16) whole grains- decreases calories retained during digestion, speeds metabolism 17) chia seeds- curb appetite 18) chilies- increases fat metabolism  -Discussed supplements that can be used:  1) Metatrim 400mg  BID 30 minutes before breakfast and dinner  2) Sphaeranthus indicus and Garcinia mangostana (combinations of these and #1 can be found in capsicum and zychrome  3) green coffee bean extract 400mg  twice per day or Irvingia (african mango) 150 to 300mg  twice per day.

## 2021-12-20 ENCOUNTER — Ambulatory Visit: Payer: PRIVATE HEALTH INSURANCE | Admitting: Physical Medicine and Rehabilitation

## 2021-12-28 ENCOUNTER — Other Ambulatory Visit: Payer: Self-pay | Admitting: Physical Medicine and Rehabilitation

## 2021-12-28 DIAGNOSIS — M4807 Spinal stenosis, lumbosacral region: Secondary | ICD-10-CM

## 2021-12-28 DIAGNOSIS — M7918 Myalgia, other site: Secondary | ICD-10-CM

## 2022-01-10 ENCOUNTER — Other Ambulatory Visit: Payer: Self-pay

## 2022-01-19 ENCOUNTER — Encounter
Payer: PRIVATE HEALTH INSURANCE | Attending: Physical Medicine and Rehabilitation | Admitting: Physical Medicine and Rehabilitation

## 2022-01-19 ENCOUNTER — Encounter: Payer: Self-pay | Admitting: Physical Medicine and Rehabilitation

## 2022-01-19 VITALS — BP 113/81 | HR 72 | Temp 97.8°F | Ht 65.0 in | Wt 148.6 lb

## 2022-01-19 DIAGNOSIS — M7918 Myalgia, other site: Secondary | ICD-10-CM | POA: Insufficient documentation

## 2022-01-19 MED ORDER — CYANOCOBALAMIN 1000 MCG/ML IJ SOLN: 1000.0000 ug | Freq: Once | INTRAMUSCULAR | 0 refills | Status: AC

## 2022-01-19 NOTE — Patient Instructions (Signed)
Alpha lipoic acid

## 2022-01-19 NOTE — Progress Notes (Signed)

## 2022-02-07 ENCOUNTER — Telehealth: Payer: Self-pay

## 2022-02-07 DIAGNOSIS — F5101 Primary insomnia: Secondary | ICD-10-CM

## 2022-02-07 MED ORDER — TRAZODONE HCL 100 MG PO TABS: ORAL_TABLET | ORAL | 1 refills | Status: DC

## 2022-03-03 ENCOUNTER — Ambulatory Visit (INDEPENDENT_AMBULATORY_CARE_PROVIDER_SITE_OTHER): Payer: PRIVATE HEALTH INSURANCE | Admitting: Psychiatry

## 2022-03-03 ENCOUNTER — Encounter: Payer: Self-pay | Admitting: Psychiatry

## 2022-03-03 DIAGNOSIS — F5101 Primary insomnia: Secondary | ICD-10-CM

## 2022-03-03 DIAGNOSIS — F410 Panic disorder [episodic paroxysmal anxiety] without agoraphobia: Secondary | ICD-10-CM | POA: Diagnosis not present

## 2022-03-03 DIAGNOSIS — F411 Generalized anxiety disorder: Secondary | ICD-10-CM

## 2022-03-03 DIAGNOSIS — F3340 Major depressive disorder, recurrent, in remission, unspecified: Secondary | ICD-10-CM | POA: Diagnosis not present

## 2022-03-03 MED ORDER — BELSOMRA 20 MG PO TABS: 20.0000 mg | ORAL_TABLET | Freq: Every day | ORAL | 5 refills | Status: DC

## 2022-03-03 MED ORDER — PROPRANOLOL HCL 10 MG PO TABS: 10.0000 mg | ORAL_TABLET | Freq: Two times a day (BID) | ORAL | 5 refills | Status: DC | PRN

## 2022-03-03 MED ORDER — BUSPIRONE HCL 30 MG PO TABS: 30.0000 mg | ORAL_TABLET | Freq: Two times a day (BID) | ORAL | 1 refills | Status: DC

## 2022-03-03 MED ORDER — ALPRAZOLAM 0.5 MG PO TABS: ORAL_TABLET | ORAL | 5 refills | Status: DC

## 2022-03-03 NOTE — Progress Notes (Signed)
Faith Thomas [id number] 10/15/1960 61 y.o.  Subjective:   Patient ID:  Faith Thomas is a 61 y.o. (DOB [birth year]) female.  Chief Complaint:  Chief Complaint  Patient presents with   Insomnia   Anxiety   Depression    Insomnia PMH includes: depression.   Anxiety Symptoms include insomnia and nausea.    Depression        Associated symptoms include insomnia.  Past medical history includes anxiety.    Faith Thomas presents to the office today for follow-up of anxiety, depression, and insomnia.   She reports that this is the anniversary month of several past negative events. Brother's birthday, her birthday, parents' anniversary, and anniversary of brother's suicide all occurred in September. She reports some depression in response to anniversaries. She reports feeling "agitated again" and irritable in response to stressors. Has increased GI s/s with stressors. Had had less time for self-care this week. She reports sadness in response to having expended energy on others and now "recognizing that was a limited resource." She reports that her motivation is higher and that she does not have the physical energy to do more. She reports difficulty with concentration recently. She reports that her work is now very detailed and requires increased focus. Appetite has been lower. She reports that she has been trying to eat healthy foods. Denies SI.   She reports that every month she has had difficulty getting Dayvigo with authorizations, pharmacy getting it in stock, etc. She reports that she continues to wake up several times a night.   She reports that she is in the process of trying to get a cPap machine.   She had a good vacation to [place] and then [place] to visit a friend. She returned to "immediate family chaos."   Continues to see therapist regularly.   Taking Alprazolam 1.5 tabs to sleep and occ during the day.   Alprazolam last filled 01/10/22 #75  Past  Medication Trials: Buspar-Effective Trintellix- memory difficulties Sertraline- Effective. Sexual side effects.  Cymbalta- memory difficulties Prozac-stomach pain Paxil Celexa Lexapro Remeron- Effective Wellbutrin XL- Irritability Lamictal- ineffective Klonopin Xanax Propranolol Doxepin Trazodone- stopped working [employer] Lyrica- hypomania Gabapentin- sleep disturbance Amitriptyline-sleeplessness, ineffective.    [place] [facility] Visit from 12/13/2021 in [facility] Visit from 11/28/2021 in [facility] Visit from 09/26/2021 in [facility] Visit from 04/07/2021 in [facility] Visit from 03/11/2021 in [facility]  PHQ-2 Total Score 0 0 0 6 0      [place] [facility] from 07/22/2021 in [facility] from 01/15/2021 in [facility] ED from 10/14/2020 in [facility]  C-SSRS RISK CATEGORY No Risk No Risk No Risk        Review of Systems:  Review of Systems  Gastrointestinal:  Positive for nausea.  Musculoskeletal:  Negative for gait problem.  Psychiatric/Behavioral:  Positive for depression. The patient has insomnia.        Please refer to HPI    Medications: I have reviewed the patient's current medications.  Current Outpatient Medications  Medication Sig Dispense Refill   acetaminophen (TYLENOL) 500 MG tablet Take 1,000 mg by mouth every 8 (eight) hours as needed.      alendronate (FOSAMAX) 70 MG tablet Take by mouth.  apixaban (ELIQUIS) 5 MG TABS tablet Take 1 tablet (5 mg total) by mouth 2 (two) times daily. 180 tablet 1   B Complex-C (VITAMIN B + C COMPLEX PO) Take by mouth.     CALCIUM PO Take by mouth.     Cholecalciferol (VITAMIN  D3) 10 MCG (400 UNIT) CAPS Vitamin D3     estradiol (ESTRACE) 0.1 MG/GM vaginal cream Place vaginally.     Estradiol 10 MCG TABS vaginal tablet Place 1 tablet vaginally 2 (two) times a week.     fluticasone (FLONASE) 50 MCG/ACT nasal spray Place into both nostrils daily as needed.     hydroxychloroquine (PLAQUENIL) 200 MG tablet Take by mouth.     L-Methylfolate 15 MG TABS Take 1 tablet (15 mg total) by mouth daily. 90 tablet 3   lisinopril (ZESTRIL) 10 MG tablet Take 1 tablet by mouth daily.     Melatonin 3 MG CAPS Take by mouth.     methocarbamol (ROBAXIN) 750 MG tablet TAKE 1 TABLET(750 MG) BY MOUTH THREE TIMES DAILY 90 tablet 3   Multiple Vitamin (MULTIVITAMIN) tablet Take 1 tablet by mouth daily.     Omega-3 Fatty Acids (OMEGA 3 PO) Take by mouth.     Probiotic Product (PROBIOTIC PO) Take 1 capsule by mouth daily.      sertraline (ZOLOFT) 100 MG tablet TAKE 2 TABLETS(200 MG) BY MOUTH AT BEDTIME 180 tablet 1   Suvorexant (BELSOMRA) 20 MG TABS Take 20 mg by mouth at bedtime. 30 tablet 5   traMADol (ULTRAM) 50 MG tablet TAKE 1 TABLET(50 MG) BY MOUTH THREE TIMES DAILY AS NEEDED 90 tablet 5   traZODone (DESYREL) 100 MG tablet Take 1/2-1 tablet po QHS prn insomnia 90 tablet 1   vitamin C (ASCORBIC ACID) 500 MG tablet Take by oral route.     albuterol (VENTOLIN HFA) 108 (90 Base) MCG/ACT inhaler      ALPRAZolam (XANAX) 0.5 MG tablet Take 1.5 tabs po QHS and 1/2 tablet twice daily as needed 75 tablet 5   busPIRone (BUSPAR) 30 MG tablet Take 1 tablet (30 mg total) by mouth 2 (two) times daily. 180 tablet 1   celecoxib (CELEBREX) 100 MG capsule Take by mouth. (Patient not taking: Reported on 03/03/2022)     propranolol (INDERAL) 10 MG tablet Take 1 tablet (10 mg total) by mouth 2 (two) times daily as needed (Anxiety). 60 tablet 5   No current facility-administered medications for this visit.    Medication Side Effects: None  Allergies:  Allergies  Allergen Reactions   Citalopram Tinitus     Other reaction(s): Unknown   Escitalopram Tinitus    Other reaction(s): Unknown   Latex Swelling    Other reaction(s): Unknown Other reaction(s): other   Penicillins Swelling, Rash and Other (See Comments)    Has patient had a PCN reaction causing immediate rash, facial/tongue/throat swelling, SOB or lightheadedness with hypotension: Yes Has patient had a PCN reaction causing severe rash involving mucus membranes or skin necrosis: No Has patient had a PCN reaction that required hospitalization: No Has patient had a PCN reaction occurring within the last 10 years: No If all of the above answers are "NO", then may proceed with Cephalosporin use.   Bee Venom Swelling   Duloxetine Hcl     -memory loss Other reaction(s): Unknown   Escitalopram Oxalate Tinitus   Other     Yeast creams- causes secondary infections, itching, burning, redness    Penicillin G     Other  reaction(s): Unknown   Progesterone     Pt feels caused weakness, fatigue, cognitive deficits Other reaction(s): Unknown   Vortioxetine     Memory problems Other reaction(s): Unknown   Bupropion Other (See Comments)    weakness and malaise. Other reaction(s): Unknown   Duloxetine Other (See Comments)   Gabapentin Other (See Comments)    Could not sleep.      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   Please see review of systems for further details on the patient's review from today.   Objective:   Physical Exam:  There were  no vitals taken for this visit.  Physical Exam Constitutional:      General: She is not in acute distress. Musculoskeletal:        General: No deformity.  Neurological:     Mental Status: She is alert and oriented to person, place, and time.     Coordination: Coordination normal.  Psychiatric:        Attention and Perception: Attention and perception normal. She does not perceive auditory or visual hallucinations.        Mood and Affect: Mood is anxious and depressed. Affect is tearful. Affect is not labile, blunt, angry or inappropriate.        Speech: Speech normal.        Behavior: Behavior normal.        Thought Content: Thought content normal. Thought content is not paranoid or delusional. Thought content does not include homicidal or suicidal ideation. Thought content does not include homicidal or suicidal plan.        Cognition and Memory: Cognition and memory normal.        Judgment: Judgment normal.     Comments: Insight intact     Lab Review:     Component Value Date/Time   NA 132 (L) 07/22/2021 1351   NA 134 (L) 01/05/2017 1359   K 3.0 (L) 07/22/2021 1351   K 3.9 01/05/2017 1359   CL 97 (L) 07/22/2021 1351   CO2 25 07/22/2021 1351   CO2 25 01/05/2017 1359   GLUCOSE 101 (H) 07/22/2021 1351   GLUCOSE 106 01/05/2017 1359   BUN 6 07/22/2021 1351   BUN 10.2 01/05/2017 1359   CREATININE 0.67 07/22/2021 1351   CREATININE 0.8 01/05/2017 1359   CALCIUM 9.2 07/22/2021 1351   CALCIUM 9.5 01/05/2017 1359   PROT 7.3 01/15/2021 1238   PROT 6.4 01/05/2017 1359   ALBUMIN 4.6 01/15/2021 1238   ALBUMIN 4.2 01/05/2017 1359   AST 25 01/15/2021 1238   AST 18 01/05/2017 1359   ALT 24 01/15/2021 1238   ALT 15 01/05/2017 1359   ALKPHOS 46 01/15/2021 1238   ALKPHOS 60 01/05/2017 1359   BILITOT 0.8 01/15/2021 1238   BILITOT 0.22 01/05/2017 1359   GFRNONAA >60 07/22/2021 1351   GFRAA >60 12/04/2016 1548  Component Value Date/Time   WBC 9.2 07/22/2021 1351   RBC 3.88  07/22/2021 1351   HGB 12.8 07/22/2021 1351   HGB 13.4 01/05/2017 1359   HCT 37.0 07/22/2021 1351   HCT 39.0 01/05/2017 1359   PLT 387 07/22/2021 1351   PLT 274 01/05/2017 1359   MCV 95.4 07/22/2021 1351   MCV 97.5 01/05/2017 1359   MCH 33.0 07/22/2021 1351   MCHC 34.6 07/22/2021 1351   RDW 12.4 07/22/2021 1351   RDW 12.4 01/05/2017 1359   LYMPHSABS 2.4 07/22/2021 1351   LYMPHSABS 3.0 01/05/2017 1359   MONOABS 1.0 07/22/2021 1351   MONOABS 0.6 01/05/2017 1359   EOSABS 0.1 07/22/2021 1351   EOSABS 0.1 01/05/2017 1359   BASOSABS 0.0 07/22/2021 1351   BASOSABS 0.0 01/05/2017 1359    No results found for: "POCLITH", "LITHIUM"   No results found for: "PHENYTOIN", "PHENOBARB", "VALPROATE", "CBMZ"   .res Assessment: Plan:    Pt seen for 45 minutes and time spent discussing recent psychosocial stressors and the effect this has had on her mood and anxiety s/s. Discussed alternatives to [facility] since she reports repeated difficulty with pharmacy filling script, insurance auth, etc. Discussed that hypnotics such as Lunesta and Ambien can cause some respiratory depression and are not recommended with sleep apnea. Discussed use of Trileptal off-label for insomnia, anxiety, and irritability. She reports that she would prefer to avoid Trileptal if it could cause hyponatremia since she has experienced this in the past. She agrees to re-trial of Belsomra 20 mg po QHS since this was somewhat effective and more accessible/available than Dayvigo.  Will continue L-Methylfolate 15 mg po qd for MTHFR mutation and augmentation of depression. Will continue Buspar 30 mg po BID for anxiety.  Continue Sertraline 200 mg po qd for anxiety and depression. Continue Xanax 0.5 mg po QHS and 1/2-1 tab po BID prn anxiety.  Continue Trazodone 100 mg 1/2-1 tab po QHS prn insomnia.  Pt to follow-up in 3 months or sooner if clinically indicated.   Recommend continuing psychotherapy.  Patient advised to contact office  with any questions, adverse effects, or acute worsening in signs and symptoms.   Faith Thomas was seen today for insomnia, anxiety and depression.  Diagnoses and all orders for this visit:  Generalized anxiety disorder -     ALPRAZolam (XANAX) 0.5 MG tablet; Take 1.5 tabs po QHS and 1/2 tablet twice daily as needed -     busPIRone (BUSPAR) 30 MG tablet; Take 1 tablet (30 mg total) by mouth 2 (two) times daily.  Primary insomnia -     ALPRAZolam (XANAX) 0.5 MG tablet; Take 1.5 tabs po QHS and 1/2 tablet twice daily as needed -     Suvorexant (BELSOMRA) 20 MG TABS; Take 20 mg by mouth at bedtime.  Panic disorder -     propranolol (INDERAL) 10 MG tablet; Take 1 tablet (10 mg total) by mouth 2 (two) times daily as needed (Anxiety).  MDD (recurrent major depressive disorder)     Please see After Visit Summary for patient specific instructions.  Future Appointments  Date Time Provider [place]  03/09/2022 11:00 AM Raulkar, Clide Deutscher, MD [facility]  03/23/2022 11:00 AM Raulkar, Clide Deutscher, MD [facility]  04/20/2022  9:30 AM Ranell Patrick, Clide Deutscher, MD [facility]  05/23/2022  1:30 PM Thayer Headings, PMHNP [facility] None  06/01/2022  9:30 AM Ranell Patrick, Clide Deutscher, MD [facility]  06/19/2022 10:00 AM Raulkar, Clide Deutscher, MD [facility]  No orders of the defined types were placed in this encounter.   -------------------------------

## 2022-03-09 ENCOUNTER — Encounter: Payer: PRIVATE HEALTH INSURANCE | Admitting: Physical Medicine and Rehabilitation

## 2022-03-23 ENCOUNTER — Encounter
Payer: PRIVATE HEALTH INSURANCE | Attending: Physical Medicine and Rehabilitation | Admitting: Physical Medicine and Rehabilitation

## 2022-03-23 DIAGNOSIS — M7918 Myalgia, other site: Secondary | ICD-10-CM | POA: Diagnosis present

## 2022-03-23 DIAGNOSIS — E663 Overweight: Secondary | ICD-10-CM | POA: Diagnosis present

## 2022-03-23 DIAGNOSIS — M4807 Spinal stenosis, lumbosacral region: Secondary | ICD-10-CM | POA: Diagnosis present

## 2022-03-23 MED ORDER — TRAMADOL HCL 50 MG PO TABS: 50.0000 mg | ORAL_TABLET | Freq: Two times a day (BID) | ORAL | 5 refills | Status: DC | PRN

## 2022-03-23 NOTE — Progress Notes (Signed)
Subjective:    Patient ID: Faith Thomas, female    DOB: 07/20/1960, 61 y.o.   MRN: [id number]   Faith Thomas is a 61 year old woman who presents for f/u of her fibromyalgia, facet arthropathy, myofascial pain, and lumbar spinal stenosis.   She received her radiofrequency nerve ablation from Dr. Wynn Thomas on 7/22 of her left L5 dorsal ramus and left L3 and L4 medial branches. She had good benefit from this. We had discussed that this would help her back pain but not her leg pain. We tried Qutenza for her back pain and leg pain previously with good benefit. She would like to repeat today. Areas of pain today are left buttock and left side of thoracic and lumbar spine. She would like to try 2 Qutenza patches in this area and doTerra deep blue essential oil in between her upper thoracic shoulder blades.  -pain has worsened a little from prior but is overall much better than it was in the past, she notes that she is not sure whether it is the trigger point injections, the Qutneza patches, the physical therapy, or the yoga that is most helping her, or a combination of these.  -she felt sick doing the Whole 30 but she felt really sick- she couldn't walk up the stairs- she quit this and felt better and added in electrolytes. She added back in dairy (yogurt, cheese, not whole milk), increased the olive oil and salt. She visited on a friend on vacation. She visited a friend from [place] and they did cooking lessons together. She uses a lot more olive oil.  -her weight today is 141, down 7 lbs from last viist! -pain is well controlled, she is ready to try Qutenza patches today Her sodium is always low normal and it tanked.  -she is happy with current system of alternating trigger point injections and Qutenza treatments -she continues to be able to tolerate work and her daily yoga.  Pain is radiating into left leg, but to a lesser extend than it did before Last time we had done a 40 minute Qutenza  appointment, discussed trying 30 minute appointment as this seems to be long enough to provide relief. Pain is overall better between her exercise, physical therapy, Qutenza, and trigger point injections.   She is on Eliquis (prescribed by her PCP, she does not see a cardiologist), which she stopped three days prior to prior ESI. She knows to have driver on the day of procedures.   She also has a painful thoracic paraspinal spasm on left side. She has greatly benefited from trigger points for this. This radiates down to her lumbar spine and she is not sure if it is connected to her lower back pain.   She has a There Cane and uses a soft foam roller and rolls on that regularly. She has a left sided thoracic parasponal muscle spasm. Ice helps decrease the pain.  She would like to be able to have less pain in order to do more activities with her 40 year old granddaughter.    She is doing Noom, a weight loss app based on psychology. Being aware of emoitional eating, lost a little bit of weight. Her goal is to get down to 135. Height is 5'5."   She has been using ginger a fair amount. It caused a lot of gastric issues.   She has had very good benefit from trigger point injections.   She has been doing meditation, using the 10%  Happier app, with good benefits. At times she does have a lot of fatigue.   She has lost 5 lbs in the past month!  Average pain is 4/5, pain right now is 5/10.  Pain Inventory Average Pain 4 Pain Right Now 5 My pain is sharp, burning, dull, stabbing, tingling and aching  In the last 24 hours, has pain interfered with the following? General activity 4 Relation with others 3 Enjoyment of life 4 What TIME of day is your pain at its worst? varies Sleep (in general) Fair  Pain is worse with: walking, bending, inactivity and standing Pain improves with: rest, heat/ice, therapy/exercise, medication and injections Relief from Meds: 6  Family History  Problem Relation  Age of Onset   Arthritis Mother    Hypertension Mother    Mental illness Mother    Anxiety disorder Mother    Depression Mother    Arthritis Father    Hypertension Father    Mental illness Father    Anxiety disorder Father    Depression Father    Mental illness Brother    ADD / ADHD Brother    Psychosis Brother    Post-traumatic stress disorder Brother    Suicidality Brother    Cancer Maternal Aunt    Autism Maternal Aunt    Depression Maternal Aunt    Arthritis Maternal Grandfather    Hypertension Maternal Grandmother    Depression Cousin    Anxiety disorder Cousin    Arthritis Daughter    Autism Daughter    Social History   Socioeconomic History   Marital status: Divorced    Spouse name: Not on file   Number of children: Not on file   Years of education: Not on file   Highest education level: Not on file  Occupational History   Not on file  Tobacco Use   Smoking status: Never   Smokeless tobacco: Never  Vaping Use   Vaping Use: Never used  Substance and Sexual Activity   Alcohol use: Yes    Alcohol/week: 0.0 standard drinks of alcohol    Comment: occasional    Drug use: No   Sexual activity: Not on file  Other Topics Concern   Not on file  Social History Narrative   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 No past surgical history on file. 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   Hypertension    Osteoporosis    PE (pulmonary thromboembolism)  (HCC)    Positive TB test    Raynaud disease    Scoliosis    Sleep apnea    Urine incontinence    There were no vitals taken for this visit.  Opioid Risk Score:   Fall Risk Score:  `1  Depression screen [facility] 2/9     12/13/2021    9:16 AM 11/28/2021    3:17 PM 09/26/2021   10:04 AM 04/07/2021  10:33 AM 03/11/2021    2:26 PM 02/03/2021   10:42 AM 07/09/2020    9:43 AM  Depression screen PHQ 2/9  Decreased Interest 0 0 0 3 0 0 0  Down, Depressed, Hopeless 0 0 0 3 0 0 0  PHQ - 2 Score 0 0 0 6 0 0 0    Review of Systems  Constitutional: Negative.   HENT: Negative.    Eyes: Negative.   Respiratory: Negative.    Cardiovascular: Negative.   Gastrointestinal: Negative.   Endocrine: Negative.   Genitourinary: Negative.   Musculoskeletal: Negative.   Skin: Negative.   Allergic/Immunologic: Negative.   Neurological: Negative.   Hematological: Negative.   Psychiatric/Behavioral: Negative.    All other systems reviewed and are negative.      Objective:   Physical Exam Gen: no distress, normal appearing, weight 141 lbs, BMI 25.79, BP 97/66 HEENT: oral mucosa pink and moist, NCAT Cardio: Reg rate Chest: normal effort, normal rate of breathing Abd: soft, non-distended Ext: no edema Skin: intact, improved musculature, no evidence of open lesions or sunburn. Neuro: Alert and oriented x3 Musculoskeletal: No longer with pain radiating down leg in L5 and S1 nerve root distribution! Full and painless flexion and extension. Left sided thoracic paraspinal and gluteal trigger points. Psych: pleasant, normal affect    Assessment & Plan:  1) Lumbar spondylosis with neurogenic claudication, left side 2) Bilateral facet joint arthritis, lumbar 3) left sided thoracic paraspinal spasm   -Mrs. Ploch's chief complaint today is left sided thoracic and gluteal pain. Her left sided sciatica in the L5 and S1 distribution resolved with prior Qutenza treatment! On physical exam, she has  painless FROM on flexion with her bilateral low back pain. On last visit this was reproduced with extension but currently she has full and painless extension. Her pain has been most consistent with left L5 and S1 nerve root impingement secondary to stenosis and lower back pain secondary to facet arthropathy, which is seen on her most recent MRI, which shows worsening facet arthropathy as well as left sided annular fissure; results reviewed and discussed with patient previously. She would benefit and is agreeable to a course of physical therapy focused on core strengthening. She would like to hold off on starting this until COVID-19 cases have reduced. For now, she will continue her daily yoga practice (she performed 2 hours today and does so regularly), which has been shown to help low back pain and which helps her.  -refilled tramadol -continue physical therapy -Discussed Qutenza as an option for neuropathic pain control. Discussed that this is a capsaicin patch, stronger than capsaicin cream. Discussed that it is currently approved for diabetic peripheral neuropathy and post-herpetic neuralgia, but that it has also shown benefit in treating other forms of neuropathy. Provided patient with link to site to learn more about the patch: FaxManual.com.pt. Discussed that the patch would be placed in office and benefits usually last 3 months. Discussed that unintended exposure to capsaicin can cause severe irritation of eyes, mucous membranes, respiratory tract, and skin, but that Qutenza is a local treatment and does not have the systemic side effects of other nerve medications. Discussed that there may be pain, itching, erythema, and decreased sensory function associated with the application of Qutenza. Side effects usually subside within 1 week. A cold pack of analgesic medications can help with these side effects. Blood pressure can also be increased due to pain associated with administration of the patch.   2 patches of Qutenza  was applied to the area of pain. Ice packs were applied during the procedure to ensure patient comfort. Blood pressure was monitored every 15 minutes. The patient tolerated the procedure well. Post-procedure instructions were given and follow-up has been scheduled.   -Discussed current symptoms of pain and history of pain.  -Discussed benefits of exercise in reducing pain. -Discussed following foods that may reduce pain: 1) Ginger (especially studied for arthritis)- reduce leukotriene production to decrease inflammation 2) Blueberries- high in phytonutrients that decrease inflammation 3) Salmon- marine omega-3s reduce joint swelling and pain 4) Pumpkin seeds- reduce inflammation 5) dark chocolate- reduces inflammation 6) turmeric- reduces inflammation 7) tart cherries - reduce pain and stiffness 8) extra virgin olive oil - its compound olecanthal helps to block prostaglandins  9) chili peppers- can be eaten or applied topically via capsaicin 10) mint- helpful for headache, muscle aches, joint pain, and itching 11) garlic- reduces inflammation  Link to further information on diet for chronic pain: http://www.bray.com/   -Good benefit from left thoracic paraspinal trigger point injections in the past  -She had good benefit from radiofrequency nerve ablation with Dr. Wynn Thomas of left L5 dorsal ramus and left L3 and L4 medial branches. We educated her that this would not help with her leg pain and now she would like to address this. Has also had temporary relief from left L5 and S1 ESI and full relief since last Qutenza administration.    -Continue Robaxin to 750mg  TID and Tramadol 50mg  TID PRN which are working for her and which she is tolerating without side effects, on an as needed basis.   4) Fibromyalgia with myalgias -Patient's symptoms have greatly improved with her change in job, allowing her  to sleep better. Continue daily yoga and meditation for stress relief. Discussed the goal of increasing her aerobic exercise to 15 minutes of daily outdoor walking for now.  -Recommended continuing NAC (N-Acetyl cysteine) 600mg  BID for her fatigue. Discussed its benefits in boosting glutathione and mitochondrial function. -continue fibromyalgia relief journal -Provided with a pain relief journal and discussed that it contains foods and lifestyle tips to naturally help to improve pain. Discussed that these lifestyle strategies are also very good for health unlike some medications which can have negative side effects. Discussed that the act of keeping a journal can be therapeutic and helpful to realize patterns what helps to trigger and alleviate pain.   -recommended doTerra Deep Blue Essential oil and applied to area of pain today- discussed that this is made of natural plant oils. Shared by personal experience of benefit from use of this essential oil -provided a link to a pdf of Pete Escogue's musculoskeletal alignment exercises -continue trigger point injections every 6 weeks.    5) Osteopenia: Counseled regarding supplements, diet, and exercise to improve bone density. She is also following with rheumatology and on Fosfomax, as well as with a dietician. She has been following diet plan well. Advised regarding risk of corticosteroids when osteopenic.    6) Hypotension: BP recently 112/82. Advised that she log her BP daily and bring reads to follow-up appointment with me or PCP so medication can be appropriately adjusted.  -Advised regarding healthy foods that can help lower blood pressure and provided with a list: 1) citrus foods- high in vitamins and minerals 2) salmon and other fatty fish - reduces inflammation and oxylipins 3) swiss chard (leafy green)- high level of nitrates 4) pumpkin seeds- one of the best natural sources of magnesium 5) Beans and lentils- high  in fiber, magnesium, and  potassium 6) Berries- high in flavonoids 7) Amaranth (whole grain, can be cooked similarly to rice and oats)- high in magnesium and fiber 8) Pistachios- even more effective at reducing BP than other nuts 9) Carrots- high in phenolic compounds that relax blood vessels and reduce inflammation 10) Celery- contain phthalides that relax tissues of arterial walls 11) Tomatoes- can also improve cholesterol and reduce risk of heart disease 12) Broccoli- good source of magnesium, calcium, and potassium 13) Greek yogurt: high in potassium and calcium 14) Herbs and spices: Celery seed, cilantro, saffron, lemongrass, black cumin, ginseng, cinnamon, cardamom, sweet basil, and ginger 15) Chia and flax seeds- also help to lower cholesterol and blood sugar 16) Beets- high levels of nitrates that relax blood vessels  17) spinach and bananas- high in potassium  -Provided lise of supplements that can help with hypertension:  1) magnesium: one high quality brand is Bioptemizers since it contains all 7 types of magnesium, otherwise over the counter magnesium gluconate 400mg  is a good option 2) B vitamins 3) vitamin D 4) potassium 5) CoQ10 6) L-arginine 7) Vitamin C 8) Beetroot -Educated that goal BP is 120/80. -Made goal to incorporate some of the above foods into diet.    7) General health: provided dietary counseling.   --Follow up in 1 months for continued management of above conditions.    8) Thoracic myofascial pain: good benefit with trigger point injections, repeat in 6 weeks and schedule for every 3 months throughout the year, apply Qutenza patches today  9) Overweight BMI 25.79: -Educated that current weight is 141 lbs, commended on her 13 lb weight loss! -continue fish oil -Educated regarding health benefits of weight loss- for pain, general health, chronic disease prevention, immune health, mental health.  -Will monitor weight every visit.  -Consider Roobois tea daily.  -Discussed the  benefits of intermittent fasting. -Discussed foods that can assist in weight loss: 1) leafy greens- high in fiber and nutrients 2) dark chocolate- improves metabolism (if prefer sweetened, best to sweeten with honey instead of sugar).  3) cruciferous vegetables- high in fiber and protein 4) full fat yogurt: high in healthy fat, protein, calcium, and probiotics 5) apples- high in a variety of phytochemicals 6) nuts- high in fiber and protein that increase feelings of fullness 7) grapefruit: rich in nutrients, antioxidants, and fiber (not to be taken with anticoagulation) 8) beans- high in protein and fiber 9) salmon- has high quality protein and healthy fats 10) green tea- rich in polyphenols 11) eggs- rich in choline and vitamin D 12) tuna- high protein, boosts metabolism 13) avocado- decreases visceral abdominal fat 14) chicken (pasture raised): high in protein and iron 15) blueberries- reduce abdominal fat and cholesterol 16) whole grains- decreases calories retained during digestion, speeds metabolism 17) chia seeds- curb appetite 18) chilies- increases fat metabolism  -Discussed supplements that can be used:  1) Metatrim 400mg  BID 30 minutes before breakfast and dinner  2) Sphaeranthus indicus and Garcinia mangostana (combinations of these and #1 can be found in capsicum and zychrome  3) green coffee bean extract 400mg  twice per day or Irvingia (african mango) 150 to 300mg  twice per day.

## 2022-03-23 NOTE — Patient Instructions (Signed)
Joovv infrared lamp 

## 2022-04-20 ENCOUNTER — Encounter: Payer: PRIVATE HEALTH INSURANCE | Admitting: Physical Medicine and Rehabilitation

## 2022-04-22 ENCOUNTER — Other Ambulatory Visit: Payer: Self-pay | Admitting: Physical Medicine and Rehabilitation

## 2022-04-22 DIAGNOSIS — M7918 Myalgia, other site: Secondary | ICD-10-CM

## 2022-04-22 DIAGNOSIS — M4807 Spinal stenosis, lumbosacral region: Secondary | ICD-10-CM

## 2022-05-05 ENCOUNTER — Telehealth: Payer: Self-pay | Admitting: Psychiatry

## 2022-05-05 ENCOUNTER — Other Ambulatory Visit: Payer: Self-pay

## 2022-05-05 DIAGNOSIS — F5101 Primary insomnia: Secondary | ICD-10-CM

## 2022-05-05 MED ORDER — TRAZODONE HCL 100 MG PO TABS: ORAL_TABLET | ORAL | 0 refills | Status: DC

## 2022-05-05 NOTE — Telephone Encounter (Signed)
Rx sent 

## 2022-05-05 NOTE — Telephone Encounter (Signed)
Faith Thomas lvm stating pharmacy has sent request for the Trazodone twice with no response.  Concerns were voiced stating this is not the first time this has happen. It is showing 1 refill remaining but pt stating pharmacy doesn't have one.   Patient would like a call back when refill is ready. # [phone]

## 2022-05-15 ENCOUNTER — Encounter: Payer: Self-pay | Admitting: Psychiatry

## 2022-05-15 ENCOUNTER — Ambulatory Visit (INDEPENDENT_AMBULATORY_CARE_PROVIDER_SITE_OTHER): Payer: PRIVATE HEALTH INSURANCE | Admitting: Psychiatry

## 2022-05-15 DIAGNOSIS — F3340 Major depressive disorder, recurrent, in remission, unspecified: Secondary | ICD-10-CM | POA: Diagnosis not present

## 2022-05-15 DIAGNOSIS — F5101 Primary insomnia: Secondary | ICD-10-CM

## 2022-05-15 DIAGNOSIS — F411 Generalized anxiety disorder: Secondary | ICD-10-CM

## 2022-05-15 DIAGNOSIS — F410 Panic disorder [episodic paroxysmal anxiety] without agoraphobia: Secondary | ICD-10-CM | POA: Diagnosis not present

## 2022-05-15 MED ORDER — TRAZODONE HCL 100 MG PO TABS: ORAL_TABLET | ORAL | 0 refills | Status: DC

## 2022-05-15 MED ORDER — SERTRALINE HCL 100 MG PO TABS: ORAL_TABLET | ORAL | 1 refills | Status: DC

## 2022-05-15 MED ORDER — PROPRANOLOL HCL 10 MG PO TABS: 10.0000 mg | ORAL_TABLET | Freq: Two times a day (BID) | ORAL | 5 refills | Status: DC | PRN

## 2022-05-15 MED ORDER — CARIPRAZINE HCL 1.5 MG PO CAPS: 1.5000 mg | ORAL_CAPSULE | Freq: Every day | ORAL | 2 refills | Status: DC

## 2022-05-15 MED ORDER — BUSPIRONE HCL 30 MG PO TABS: 30.0000 mg | ORAL_TABLET | Freq: Two times a day (BID) | ORAL | 1 refills | Status: DC

## 2022-05-15 NOTE — Progress Notes (Unsigned)
Faith BadeMary Anna Glowacki [id number] [birth year] 61 y.o.  Subjective:   Patient ID:  Faith Thomas is a 61 y.o. (DOB [birth year]) female.  Chief Complaint: No chief complaint on file.   HPI Faith BadeMary Anna Hibner presents to the office today for follow-up of anxiety, depression, and insomnia.   "Definitely less depressed." She reports "I have more ability to follow through." She reports that she continues "to have a fair amount of anxiety." Had to go and [place] and after that it was taking 5-6 hours to "calm my body down." She reports that she had been sleeping better with cPap and then had some sleep disturbance. She has been taking Alprazolam 1.5 tabs to help fall asleep. She reports that Trazodone was initially effective and then no longer as helpful. She had panic attacks the first week of acute stressors with father and noticed she made some mistakes at work. She reports that concentration has improved since then and that she continues to have some difficulty with concentration at times when distracted by acute stressors when working on more detailed work that requires sustained focus. She reports frequent worry and muscle tension. Energy and motivation were low when acute stressors occurred and have been lower with current sinus infection. Appetite has been ok with some occ binge eating. Denies SI. "I do feel a lot more hopeful."   Father has been the victim of multiple scams. She has POA. She has been trying to figure out how involved she needs/wants to get into his care and protection, ie. Guardianship.  They have been back to [place] for 2.5 weeks. Father is calling her a various hours, ie. 4 am because his technology is not working. Father is now in an independent living facility in [place].   She has a sinus infection.   She reports that Belsomra required prior authorization from office and this was not received. She therefore did not receive  Belsomra.   She is seeing her therapist regularly.   Some work related changes and take over at work.   Past Medication Trials: Buspar-Effective Trintellix- memory difficulties Sertraline- Effective. Sexual side effects.  Cymbalta- memory difficulties Prozac-stomach pain Paxil Celexa Lexapro Remeron- Effective Wellbutrin XL- Irritability Lamictal- ineffective Klonopin Xanax Propranolol Doxepin Trazodone- stopped working [employer] Belsomra [place] Lyrica- hypomania Gabapentin- sleep disturbance Amitriptyline-sleeplessness, ineffective.    PHQ2-9    Flowsheet Row [facility]  PHQ-2 Total Score 0 0 0 6 0      Flowsheet Row [facility] from 10/14/2020 in [facility]  C-SSRS RISK CATEGORY No Risk No Risk No Risk        Review of Systems:  Review of Systems  HENT:  Positive for sinus pressure and sinus pain.   Musculoskeletal:  Negative for gait problem.  Neurological:  Negative for tremors.  Psychiatric/Behavioral:         Please refer to HPI    Medications: I have reviewed the patient's current medications.  Current Outpatient Medications  Medication Sig Dispense Refill   acetaminophen (TYLENOL) 500 MG  tablet Take 1,000 mg by mouth every 8 (eight) hours as needed.      albuterol (VENTOLIN HFA) 108 (90 Base) MCG/ACT inhaler      alendronate (FOSAMAX) 70 MG tablet Take by mouth.     ALPRAZolam (XANAX) 0.5 MG tablet Take 1.5 tabs  po QHS and 1/2 tablet twice daily as needed 75 tablet 5   apixaban (ELIQUIS) 5 MG TABS tablet Take 1 tablet (5 mg total) by mouth 2 (two) times daily. 180 tablet 1   B Complex-C (VITAMIN B + C COMPLEX PO) Take by mouth.     busPIRone (BUSPAR) 30 MG tablet Take 1 tablet (30 mg total) by mouth 2 (two) times daily. 180 tablet 1   CALCIUM PO Take by mouth.     celecoxib (CELEBREX) 100 MG capsule Take by mouth. (Patient not taking: Reported on 03/03/2022)     Cholecalciferol (VITAMIN D3) 10 MCG (400 UNIT) CAPS Vitamin D3     estradiol (ESTRACE) 0.1 MG/GM vaginal cream Place vaginally.     Estradiol 10 MCG TABS vaginal tablet Place 1 tablet vaginally 2 (two) times a week.     fluticasone (FLONASE) 50 MCG/ACT nasal spray Place into both nostrils daily as needed.     hydroxychloroquine (PLAQUENIL) 200 MG tablet Take by mouth.     L-Methylfolate 15 MG TABS Take 1 tablet (15 mg total) by mouth daily. 90 tablet 3   lisinopril (ZESTRIL) 10 MG tablet Take 1 tablet by mouth daily.     Melatonin 3 MG CAPS Take by mouth.     methocarbamol (ROBAXIN) 750 MG tablet TAKE 1 TABLET(750 MG) BY MOUTH THREE TIMES DAILY 90 tablet 3   Multiple Vitamin (MULTIVITAMIN) tablet Take 1 tablet by mouth daily.     Omega-3 Fatty Acids (OMEGA 3 PO) Take by mouth.     Probiotic Product (PROBIOTIC PO) Take 1 capsule by mouth daily.      propranolol (INDERAL) 10 MG tablet Take 1 tablet (10 mg total) by mouth 2 (two) times daily as needed (Anxiety). 60 tablet 5   sertraline (ZOLOFT) 100 MG tablet TAKE 2 TABLETS(200 MG) BY MOUTH AT BEDTIME 180 tablet 1   Suvorexant (BELSOMRA) 20 MG TABS Take 20 mg by mouth at bedtime. 30 tablet 5   traMADol (ULTRAM) 50 MG tablet Take 1 tablet (50 mg total) by mouth 2 (two) times daily as needed. 60 tablet 5   traZODone (DESYREL) 100 MG tablet Take 1/2-1 tablet po QHS prn insomnia 90 tablet 0   vitamin C (ASCORBIC ACID) 500 MG tablet Take by oral route.     No current facility-administered medications  for this visit.    Medication Side Effects: None  Allergies:  Allergies  Allergen Reactions   Citalopram Tinitus    Other reaction(s): Unknown   Escitalopram Tinitus    Other reaction(s): Unknown   Latex Swelling    Other reaction(s): Unknown Other reaction(s): other   Penicillins Swelling, Rash and Other (See Comments)    Has patient had a PCN reaction causing immediate rash, facial/tongue/throat swelling, SOB or lightheadedness with hypotension: Yes Has patient had a PCN reaction causing severe rash involving mucus membranes or skin necrosis: No Has patient had a PCN reaction that required hospitalization: No Has patient had a PCN reaction occurring within the last 10 years: No If all of the above answers are "NO", then may proceed with Cephalosporin use.   Bee Venom Swelling   Duloxetine Hcl     -memory loss Other  reaction(s): Unknown   Escitalopram Oxalate Tinitus   Other     Yeast creams- causes secondary infections, itching, burning, redness    Penicillin G     Other reaction(s): Unknown   Progesterone     Pt feels caused weakness, fatigue, cognitive deficits Other reaction(s): Unknown   Vortioxetine     Memory problems Other reaction(s): Unknown   Bupropion Other (See Comments)    weakness and malaise. Other reaction(s): Unknown   Duloxetine Other (See Comments)   Gabapentin Other (See Comments)    Could not sleep.      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   Please  see review of systems for further details on the patient's review from today.   Objective:   Physical Exam:  There were no vitals taken for this visit.  Physical Exam Constitutional:      General: She is not in acute distress. Musculoskeletal:        General: No deformity.  Neurological:     Mental Status: She is alert and oriented to person, place, and time.     Coordination: Coordination normal.  Psychiatric:        Attention and Perception: Attention and perception normal. She does not perceive auditory or visual hallucinations.        Mood and Affect: Mood is not depressed. Affect is not labile, blunt, angry or inappropriate.        Speech: Speech normal.        Behavior: Behavior normal.        Thought Content: Thought content normal. Thought content is not paranoid or delusional. Thought content does not include homicidal or suicidal ideation. Thought content does not include homicidal or suicidal plan.        Cognition and Memory: Cognition and memory normal.        Judgment: Judgment normal.     Comments: Insight intact Some anxiety in response to stressors with father.      Lab Review:     Component Value Date/Time   NA 132 (L) 07/22/2021 1351   NA 134 (L) 01/05/2017 1359   K 3.0 (L) 07/22/2021 1351   K 3.9 01/05/2017 1359   CL 97 (L) 07/22/2021 1351   CO2 25 07/22/2021 1351   CO2 25 01/05/2017 1359   GLUCOSE 101 (H) 07/22/2021 1351   GLUCOSE 106 01/05/2017 1359   BUN 6 07/22/2021 1351   BUN 10.2 01/05/2017 1359   CREATININE 0.67 07/22/2021 1351   CREATININE 0.8 01/05/2017 1359   CALCIUM 9.2 07/22/2021 1351   CALCIUM 9.5 01/05/2017 1359   PROT 7.3 01/15/2021 1238   PROT 6.4 01/05/2017 1359   ALBUMIN 4.6 01/15/2021 1238   ALBUMIN 4.2 01/05/2017 1359   AST 25 01/15/2021 1238   AST 18 01/05/2017 1359   ALT 24 01/15/2021 1238   ALT 15 01/05/2017 1359   ALKPHOS 46  01/15/2021 1238   ALKPHOS 60 01/05/2017 1359   BILITOT 0.8 01/15/2021 1238   BILITOT 0.22  01/05/2017 1359   GFRNONAA >60 07/22/2021 1351   GFRAA >60 12/04/2016 1548       Component Value Date/Time   WBC 9.2 07/22/2021 1351   RBC 3.88 07/22/2021 1351   HGB 12.8 07/22/2021 1351   HGB 13.4 01/05/2017 1359   HCT 37.0 07/22/2021 1351   HCT 39.0 01/05/2017 1359   PLT 387 07/22/2021 1351   PLT 274 01/05/2017 1359   MCV 95.4 07/22/2021 1351   MCV 97.5 01/05/2017 1359   MCH 33.0 07/22/2021 1351   MCHC 34.6 07/22/2021 1351   RDW 12.4 07/22/2021 1351   RDW 12.4 01/05/2017 1359   LYMPHSABS 2.4 07/22/2021 1351   LYMPHSABS 3.0 01/05/2017 1359   MONOABS 1.0 07/22/2021 1351   MONOABS 0.6 01/05/2017 1359   EOSABS 0.1 07/22/2021 1351   EOSABS 0.1 01/05/2017 1359   BASOSABS 0.0 07/22/2021 1351   BASOSABS 0.0 01/05/2017 1359    No results found for: "POCLITH", "LITHIUM"   No results found for: "PHENYTOIN", "PHENOBARB", "VALPROATE", "CBMZ"   .res Assessment: Plan:    There are no diagnoses linked to this encounter.   Please see After Visit Summary for patient specific instructions.  Future Appointments  Date Time Provider Department Center  06/01/2022  9:30 AM Raulkar, Drema Pry, MD [facility]  06/19/2022 10:00 AM Raulkar, Drema Pry, MD [facility]  07/03/2022 11:20 AM Raulkar, Drema Pry, MD [facility]    No orders of the defined types were placed in this encounter.   -------------------------------

## 2022-05-23 ENCOUNTER — Ambulatory Visit (INDEPENDENT_AMBULATORY_CARE_PROVIDER_SITE_OTHER): Payer: PRIVATE HEALTH INSURANCE | Admitting: Psychiatry

## 2022-06-01 ENCOUNTER — Encounter
Payer: PRIVATE HEALTH INSURANCE | Attending: Physical Medicine and Rehabilitation | Admitting: Physical Medicine and Rehabilitation

## 2022-06-01 VITALS — BP 107/74 | HR 72 | Ht 65.0 in | Wt 140.6 lb

## 2022-06-01 DIAGNOSIS — M48062 Spinal stenosis, lumbar region with neurogenic claudication: Secondary | ICD-10-CM | POA: Diagnosis present

## 2022-06-01 DIAGNOSIS — M7918 Myalgia, other site: Secondary | ICD-10-CM | POA: Diagnosis present

## 2022-06-01 MED ORDER — SODIUM CHLORIDE (PF) 0.9 % IJ SOLN
2.0000 mL | Freq: Every day | INTRAMUSCULAR | Status: AC
  Administered 2022-06-01: 2 mL via INTRAVENOUS

## 2022-06-01 MED ORDER — LIDOCAINE HCL 1 % IJ SOLN
3.0000 mL | Freq: Once | INTRAMUSCULAR | Status: AC
  Administered 2022-06-01: 3 mL

## 2022-06-01 NOTE — Progress Notes (Signed)
Subjective:    Patient ID: Faith Thomas, female    DOB: 07/20/1960, 61 y.o.   MRN: [id number]   Faith Thomas is a 61 year old woman who presents for f/u of her fibromyalgia, facet arthropathy, myofascial pain, and lumbar spinal stenosis.   She received her radiofrequency nerve ablation from Dr. Wynn Banker on 7/22 of her left L5 dorsal ramus and left L3 and L4 medial branches. She had good benefit from this. We had discussed that this would help her back pain but not her leg pain. We tried Qutenza for her back pain and leg pain previously with good benefit. She would like to repeat today. Areas of pain today are left buttock and left side of thoracic and lumbar spine. She would like to try 2 Qutenza patches in this area and doTerra deep blue essential oil in between her upper thoracic shoulder blades.  -pain has worsened a little from prior but is overall much better than it was in the past, she notes that she is not sure whether it is the trigger point injections, the Qutneza patches, the physical therapy, or the yoga that is most helping her, or a combination of these.  -she felt sick doing the Whole 30 but she felt really sick- she couldn't walk up the stairs- she quit this and felt better and added in electrolytes. She added back in dairy (yogurt, cheese, not whole milk), increased the olive oil and salt. She visited on a friend on vacation. She visited a friend from [place] and they did cooking lessons together. She uses a lot more olive oil.  -her weight today is 141, down 7 lbs from last viist! -pain is well controlled, she is ready to try Qutenza patches today Her sodium is always low normal and it tanked.  -she is happy with current system of alternating trigger point injections and Qutenza treatments -she continues to be able to tolerate work and her daily yoga.  Pain is radiating into left leg, but to a lesser extend than it did before Last time we had done a 40 minute Qutenza  appointment, discussed trying 30 minute appointment as this seems to be long enough to provide relief. Pain is overall better between her exercise, physical therapy, Qutenza, and trigger point injections.   She is on Eliquis (prescribed by her PCP, she does not see a cardiologist), which she stopped three days prior to prior ESI. She knows to have driver on the day of procedures.   She also has a painful thoracic paraspinal spasm on left side. She has greatly benefited from trigger points for this. This radiates down to her lumbar spine and she is not sure if it is connected to her lower back pain.   She has a There Cane and uses a soft foam roller and rolls on that regularly. She has a left sided thoracic parasponal muscle spasm. Ice helps decrease the pain.  She would like to be able to have less pain in order to do more activities with her 40 year old granddaughter.    She is doing Noom, a weight loss app based on psychology. Being aware of emoitional eating, lost a little bit of weight. Her goal is to get down to 135. Height is 5'5."   She has been using ginger a fair amount. It caused a lot of gastric issues.   She has had very good benefit from trigger point injections.   She has been doing meditation, using the 10%  Happier app, with good benefits. At times she does have a lot of fatigue.   She has lost 5 lbs in the past month!  Average pain is 4/5, pain right now is 5/10.  Has been having a sinus infection for 4-6 weeks She was having yellow mucus drainage Stress has dramatically increased She had to move her dad back from [place] right before Thanksgiving He has a history of TBI and has a hard time distinguishing between needs and wants.   Pain Inventory Average Pain 4 Pain Right Now 5 My pain is sharp, burning, dull, stabbing, tingling and aching  In the last 24 hours, has pain interfered with the following? General activity 4 Relation with others 3 Enjoyment of life  4 What TIME of day is your pain at its worst? varies Sleep (in general) Fair  Pain is worse with: walking, bending, inactivity and standing Pain improves with: rest, heat/ice, therapy/exercise, medication and injections Relief from Meds: 6  Family History  Problem Relation Age of Onset   Arthritis Mother    Hypertension Mother    Mental illness Mother    Anxiety disorder Mother    Depression Mother    Arthritis Father    Hypertension Father    Mental illness Father    Anxiety disorder Father    Depression Father    Mental illness Brother    ADD / ADHD Brother    Psychosis Brother    Post-traumatic stress disorder Brother    Suicidality Brother    Cancer Maternal Aunt    Autism Maternal Aunt    Depression Maternal Aunt    Arthritis Maternal Grandfather    Hypertension Maternal Grandmother    Depression Cousin    Anxiety disorder Cousin    Arthritis Daughter    Autism Daughter    Social History   Socioeconomic History   Marital status: Divorced    Spouse name: Not on file   Number of children: Not on file   Years of education: Not on file   Highest education level: Not on file  Occupational History   Not on file  Tobacco Use   Smoking status: Never   Smokeless tobacco: Never  Vaping Use   Vaping Use: Never used  Substance and Sexual Activity   Alcohol use: Yes    Alcohol/week: 0.0 standard drinks of alcohol    Comment: occasional    Drug use: No   Sexual activity: Not on file  Other Topics Concern   Not on file  Social History Narrative   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 No past surgical history on file. Past Medical History:  Diagnosis  Date   Allergy    Depression    DVT (deep venous thrombosis) (HCC)    Fibromyalgia    Genetic defect    GERD (gastroesophageal reflux disease)    History of IBS    Homozygous for MTHFR gene mutation 11/28/2016   Hypertension    Osteoporosis    PE (pulmonary thromboembolism) (HCC)    Positive TB test    Raynaud disease    Scoliosis    Sleep apnea    Urine incontinence  There were no vitals taken for this visit.  Opioid Risk Score:   Fall Risk Score:  `1  Depression screen [facility] 2/9     12/13/2021    9:16 AM 11/28/2021    3:17 PM 09/26/2021   10:04 AM 04/07/2021   10:33 AM 03/11/2021    2:26 PM 02/03/2021   10:42 AM 07/09/2020    9:43 AM  Depression screen PHQ 2/9  Decreased Interest 0 0 0 3 0 0 0  Down, Depressed, Hopeless 0 0 0 3 0 0 0  PHQ - 2 Score 0 0 0 6 0 0 0    Review of Systems  Constitutional: Negative.   HENT: Negative.    Eyes: Negative.   Respiratory: Negative.    Cardiovascular: Negative.   Gastrointestinal: Negative.   Endocrine: Negative.   Genitourinary: Negative.   Musculoskeletal: Negative.   Skin: Negative.   Allergic/Immunologic: Negative.   Neurological: Negative.   Hematological: Negative.   Psychiatric/Behavioral: Negative.    All other systems reviewed and are negative.      Objective:   Physical Exam Gen: no distress, normal appearing, weight 148 lbs HEENT: oral mucosa pink and moist, NCAT Cardio: Reg rate Chest: normal effort, normal rate of breathing Abd: soft, non-distended Ext: no edema Skin: intact, improved musculature, no evidence of open lesions or sunburn. Neuro: Alert and oriented x3 Musculoskeletal: No longer with pain radiating down leg in L5 and S1 nerve root distribution! Full and painless flexion and extension. Left sided thoracic paraspinal and gluteal trigger points. Psych: pleasant, normal affect    Assessment & Plan:  1) Lumbar spondylosis with neurogenic claudication, left side 2) Bilateral facet joint  arthritis, lumbar 3) left sided thoracic paraspinal spasm   -Faith Thomas's chief complaint today is left sided thoracic and gluteal pain. Her left sided sciatica in the L5 and S1 distribution resolved with prior Qutenza treatment! On physical exam, she has painless FROM on flexion with her bilateral low back pain. On last visit this was reproduced with extension but currently she has full and painless extension. Her pain has been most consistent with left L5 and S1 nerve root impingement secondary to stenosis and lower back pain secondary to facet arthropathy, which is seen on her most recent MRI, which shows worsening facet arthropathy as well as left sided annular fissure; results reviewed and discussed with patient previously. She would benefit and is agreeable to a course of physical therapy focused on core strengthening. She would like to hold off on starting this until COVID-19 cases have reduced. For now, she will continue her daily yoga practice (she performed 2 hours today and does so regularly), which has been shown to help low back pain and which helps her.  -refilled tramadol -continue physical therapy -Discussed Qutenza as an option for neuropathic pain control. Discussed that this is a capsaicin patch, stronger than capsaicin cream. Discussed that it is currently approved for diabetic peripheral neuropathy and post-herpetic neuralgia, but that it has also shown benefit in treating other forms of neuropathy. Provided patient with link to site to learn more about the patch: https://www.clark.biz/. Discussed that the patch would be placed in office and benefits usually last 3 months. Discussed that unintended exposure to capsaicin can cause severe irritation of eyes, mucous membranes, respiratory tract, and skin, but that Qutenza is a local treatment and does not have the systemic side effects of other nerve medications. Discussed that there may be pain, itching, erythema, and decreased  sensory function associated  with the application of Qutenza. Side effects usually subside within 1 week. A cold pack of analgesic medications can help with these side effects. Blood pressure can also be increased due to pain associated with administration of the patch.  2 patches of Qutenza was applied to the area of pain. Ice packs were applied during the procedure to ensure patient comfort. Blood pressure was monitored every 15 minutes. The patient tolerated the procedure well. Post-procedure instructions were given and follow-up has been scheduled.   -Discussed current symptoms of pain and history of pain.  -Discussed benefits of exercise in reducing pain. -Discussed following foods that may reduce pain: 1) Ginger (especially studied for arthritis)- reduce leukotriene production to decrease inflammation 2) Blueberries- high in phytonutrients that decrease inflammation 3) Salmon- marine omega-3s reduce joint swelling and pain 4) Pumpkin seeds- reduce inflammation 5) dark chocolate- reduces inflammation 6) turmeric- reduces inflammation 7) tart cherries - reduce pain and stiffness 8) extra virgin olive oil - its compound olecanthal helps to block prostaglandins  9) chili peppers- can be eaten or applied topically via capsaicin 10) mint- helpful for headache, muscle aches, joint pain, and itching 11) garlic- reduces inflammation  Link to further information on diet for chronic pain: http://www.bray.com/   -Good benefit from left thoracic paraspinal trigger point injections in the past  -She had good benefit from radiofrequency nerve ablation with Dr. Wynn Banker of left L5 dorsal ramus and left L3 and L4 medial branches. We educated her that this would not help with her leg pain and now she would like to address this. Has also had temporary relief from left L5 and S1 ESI and full relief since last Qutenza administration.     -Continue Robaxin to  TID and Tramadol  TID PRN which are working for her and which she is tolerating without side effects, on an as needed basis.   4) Fibromyalgia with myalgias -Patient's symptoms have greatly improved with her change in job, allowing her to sleep better. Continue daily yoga and meditation for stress relief. Discussed the goal of increasing her aerobic exercise to 15 minutes of daily outdoor walking for now.  -Recommended continuing NAC (N-Acetyl cysteine)  BID for her fatigue. Discussed its benefits in boosting glutathione and mitochondrial function. -continue fibromyalgia relief journal -Provided with a pain relief journal and discussed that it contains foods and lifestyle tips to naturally help to improve pain. Discussed that these lifestyle strategies are also very good for health unlike some medications which can have negative side effects. Discussed that the act of keeping a journal can be therapeutic and helpful to realize patterns what helps to trigger and alleviate pain.   -recommended doTerra Deep Blue Essential oil and applied to area of pain today- discussed that this is made of natural plant oils. Shared by personal experience of benefit from use of this essential oil -provided a link to a pdf of Pete Escogue's musculoskeletal alignment exercises -continue trigger point injections every 6 weeks.    5) Osteopenia: Counseled regarding supplements, diet, and exercise to improve bone density. She is also following with rheumatology and on Fosfomax, as well as with a dietician. She has been following diet plan well. Advised regarding risk of corticosteroids when osteopenic.    6) Hypotension: BP recently 112/82. Advised that she log her BP daily and bring reads to follow-up appointment with me or PCP so medication can be appropriately adjusted.  -Advised regarding healthy foods that can help lower blood pressure and provided with a list:  1) citrus foods- high  in vitamins and minerals 2) salmon and other fatty fish - reduces inflammation and oxylipins 3) swiss chard (leafy green)- high level of nitrates 4) pumpkin seeds- one of the best natural sources of magnesium 5) Beans and lentils- high in fiber, magnesium, and potassium 6) Berries- high in flavonoids 7) Amaranth (whole grain, can be cooked similarly to rice and oats)- high in magnesium and fiber 8) Pistachios- even more effective at reducing BP than other nuts 9) Carrots- high in phenolic compounds that relax blood vessels and reduce inflammation 10) Celery- contain phthalides that relax tissues of arterial walls 11) Tomatoes- can also improve cholesterol and reduce risk of heart disease 12) Broccoli- good source of magnesium, calcium, and potassium 13) Greek yogurt: high in potassium and calcium 14) Herbs and spices: Celery seed, cilantro, saffron, lemongrass, black cumin, ginseng, cinnamon, cardamom, sweet basil, and ginger 15) Chia and flax seeds- also help to lower cholesterol and blood sugar 16) Beets- high levels of nitrates that relax blood vessels  17) spinach and bananas- high in potassium  -Provided lise of supplements that can help with hypertension:  1) magnesium: one high quality brand is Bioptemizers since it contains all 7 types of magnesium, otherwise over the counter magnesium gluconate 400mg  is a good option 2) B vitamins 3) vitamin D 4) potassium 5) CoQ10 6) L-arginine 7) Vitamin C 8) Beetroot -Educated that goal BP is 120/80. -Made goal to incorporate some of the above foods into diet.    7) General health: provided dietary counseling.   --Follow up in 1 months for continued management of above conditions.    8) Thoracic myofascial pain: good benefit with trigger point injections, repeat in 6 weeks and schedule for every 3 months throughout the year, apply Qutenza patches today Trigger Point Injection  Indication: Thoracic myofascial pain not relieved by  medication management and other conservative care.  Informed consent was obtained after describing risk and benefits of the procedure with the patient, this includes bleeding, bruising, infection and medication side effects.  The patient wishes to proceed and has given written consent.  The patient was placed in a seated position.  The area of pain was marked and prepped with Betadine.  It was entered with a 25-gauge 1/2 inch needle and a total of 5 mL of 1% lidocaine and normal saline was injected into a total of 4 trigger points, after negative draw back for blood.  The patient tolerated the procedure well.  Post procedure instructions were given.   9) Overweight BMI 25.79: -Educated that current weight is 141 lbs, commended on her 13 lb weight loss! -continue fish oil -Educated regarding health benefits of weight loss- for pain, general health, chronic disease prevention, immune health, mental health.  -Will monitor weight every visit.  -Consider Roobois tea daily.  -Discussed the benefits of intermittent fasting. -Discussed foods that can assist in weight loss: 1) leafy greens- high in fiber and nutrients 2) dark chocolate- improves metabolism (if prefer sweetened, best to sweeten with honey instead of sugar).  3) cruciferous vegetables- high in fiber and protein 4) full fat yogurt: high in healthy fat, protein, calcium, and probiotics 5) apples- high in a variety of phytochemicals 6) nuts- high in fiber and protein that increase feelings of fullness 7) grapefruit: rich in nutrients, antioxidants, and fiber (not to be taken with anticoagulation) 8) beans- high in protein and fiber 9) salmon- has high quality protein and healthy fats 10) green tea- rich in polyphenols  11) eggs- rich in choline and vitamin D 12) tuna- high protein, boosts metabolism 13) avocado- decreases visceral abdominal fat 14) chicken (pasture raised): high in protein and iron 15) blueberries- reduce abdominal fat  and cholesterol 16) whole grains- decreases calories retained during digestion, speeds metabolism 17) chia seeds- curb appetite 18) chilies- increases fat metabolism  -Discussed supplements that can be used:  1) Metatrim  BID 30 minutes before breakfast and dinner  2) Sphaeranthus indicus and Garcinia mangostana (combinations of these and #1 can be found in capsicum and zychrome  3) green coffee bean extract  twice per day or Irvingia (african mango) 150 to  twice per day.  10) Sinus infections -recommended turmeric and ginger, steam inhalation.

## 2022-06-19 ENCOUNTER — Ambulatory Visit: Payer: PRIVATE HEALTH INSURANCE | Admitting: Physical Medicine and Rehabilitation

## 2022-06-26 ENCOUNTER — Encounter: Payer: Self-pay | Admitting: Psychiatry

## 2022-06-26 ENCOUNTER — Ambulatory Visit (INDEPENDENT_AMBULATORY_CARE_PROVIDER_SITE_OTHER): Payer: PRIVATE HEALTH INSURANCE | Admitting: Psychiatry

## 2022-06-26 DIAGNOSIS — F5101 Primary insomnia: Secondary | ICD-10-CM | POA: Diagnosis not present

## 2022-06-26 DIAGNOSIS — F411 Generalized anxiety disorder: Secondary | ICD-10-CM

## 2022-06-26 DIAGNOSIS — F3340 Major depressive disorder, recurrent, in remission, unspecified: Secondary | ICD-10-CM

## 2022-06-26 MED ORDER — REXULTI 0.5 MG PO TABS: 0.5000 mg | ORAL_TABLET | Freq: Every day | ORAL | 1 refills | Status: DC

## 2022-06-26 MED ORDER — TRAZODONE HCL 100 MG PO TABS: ORAL_TABLET | ORAL | 0 refills | Status: DC

## 2022-06-26 MED ORDER — REXULTI 0.5 MG PO TABS: 0.5000 mg | ORAL_TABLET | Freq: Every day | ORAL | 0 refills | Status: DC

## 2022-06-26 NOTE — Progress Notes (Signed)
Faith Thomas [id number] 1961-04-08 62 y.o.  Subjective:   Patient ID:  Faith Thomas is a 62 y.o. (DOB June 21, 1960) female.  Chief Complaint: No chief complaint on file.   HPI Faith Thomas presents to the office today for follow-up of anxiety, depression, and insomnia.   She reports that she had been better able to complete tasks and had more energy. She reports that some tasks, "didn't seem as effortful." She reports, "I think the motivation has always been there."   She has been waking up between 3:30-5 am. She feels like Vraylar may be causing her to wake up earlier. Some middle of the night awakenings. Estimates sleeping 5-6 hours a night. She is now keeping granddaughter over night 3 nights a week since daughter is now working nights. She also had some constipation while starting Vraylar and also taking antibiotics. She notices increased cravings for sweets and gained 5 lbs. Appetite has been increased overall and notices more hunger. She has just started alternating Vraylar with every other day and notices a significant in her energy today. "I need something like Vraylar, but I am really happy with my weight loss" and wants to maintain current weight. She reports that her mood has been "happier" in response to feeling more productive. She reports that she feels like her "thinking is more focused" and this may be related to a decrease in anxiety. She reports that she occasionally waking up during the night and will have some rumination about her father. Improved interest in things. Denies SI.   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   Past Medication Trials: Buspar-Effective Trintellix- memory difficulties Sertraline- Effective. Sexual side effects.  Cymbalta- memory difficulties Prozac-stomach pain Paxil Celexa Lexapro Remeron- Effective Wellbutrin XL- Irritability Vraylar-Helpful for energy and motivation. Increased cravings for sugar.  Lamictal-  ineffective Klonopin Xanax Propranolol Doxepin Trazodone- stopped working [employer] Lyrica- hypomania Gabapentin- sleep disturbance Amitriptyline-sleeplessness, ineffective.      AIMS    Flowsheet Row Office Visit from 06/26/2022 in [facility]  AIMS Total Score 1      PHQ2-9    Flowsheet Row Office Visit from 12/13/2021 in [facility] Office Visit from 11/28/2021 in [facility] Office Visit from 09/26/2021 in [facility] Physical Medicine & Rehabilitation Office Visit from 04/07/2021 in [facility] Physical Medicine & Rehabilitation Office Visit from 03/11/2021 in [facility] Physical Medicine & Rehabilitation  PHQ-2 Total Score 0 0 0 6 0      Flowsheet Row ED from 07/22/2021 in [facility] Emergency Department at [facility] ED from 01/15/2021 in [facility] Emergency Department at [facility] ED from 10/14/2020 in [facility] Emergency Department at [facility]  C-SSRS RISK CATEGORY No Risk No Risk No Risk        Review of Systems:  Review of Systems  Musculoskeletal:  Positive for arthralgias and myalgias. Negative for gait problem.  Neurological:  Positive for tremors.  Psychiatric/Behavioral:         Please refer to HPI    Medications: I have reviewed the patient's current medications.  Current Outpatient Medications  Medication Sig Dispense Refill   Brexpiprazole (REXULTI) 0.5 MG TABS Take 1 tablet (0.5 mg total) by mouth daily. 30 tablet 1   Brexpiprazole (REXULTI) 0.5 MG TABS Take 1 tablet (0.5 mg total) by mouth daily. 14 tablet 0   cetirizine (ZYRTEC) 10 MG tablet Take 10  mg by mouth daily as needed for allergies.     acetaminophen (TYLENOL) 500 MG tablet Take 1,000 mg by mouth every 8 (eight) hours as needed.      albuterol (VENTOLIN HFA) 108 (90 Base) MCG/ACT inhaler      alendronate (FOSAMAX) 70 MG tablet Take by mouth.      ALPRAZolam (XANAX) 0.5 MG tablet Take 1.5 tabs po QHS and 1/2 tablet twice daily as needed 75 tablet 5   apixaban (ELIQUIS) 5 MG TABS tablet Take 1 tablet (5 mg total) by mouth 2 (two) times daily. 180 tablet 1   B Complex-C (VITAMIN B + C COMPLEX PO) Take by mouth.     busPIRone (BUSPAR) 30 MG tablet Take 1 tablet (30 mg total) by mouth 2 (two) times daily. 180 tablet 1   CALCIUM PO Take by mouth.     celecoxib (CELEBREX) 100 MG capsule Take by mouth. (Patient not taking: Reported on 03/03/2022)     Cholecalciferol (VITAMIN D3) 10 MCG (400 UNIT) CAPS Vitamin D3     estradiol (ESTRACE) 0.1 MG/GM vaginal cream Place vaginally.     Estradiol 10 MCG TABS vaginal tablet Place 1 tablet vaginally 2 (two) times a week.     fluticasone (FLONASE) 50 MCG/ACT nasal spray Place into both nostrils daily as needed.     hydroxychloroquine (PLAQUENIL) 200 MG tablet Take by mouth.     L-Methylfolate 15 MG TABS Take 1 tablet (15 mg total) by mouth daily. 90 tablet 3   lisinopril (ZESTRIL) 10 MG tablet Take 1 tablet by mouth daily.     Melatonin 3 MG CAPS Take by mouth.     methocarbamol (ROBAXIN) 750 MG tablet TAKE 1 TABLET(750 MG) BY MOUTH THREE TIMES DAILY 90 tablet 3   Multiple Vitamin (MULTIVITAMIN) tablet Take 1 tablet by mouth daily.     Omega-3 Fatty Acids (OMEGA 3 PO) Take by mouth.     Probiotic Product (PROBIOTIC PO) Take 1 capsule by mouth daily.      propranolol (INDERAL) 10 MG tablet Take 1 tablet (10 mg total) by mouth 2 (two) times daily as needed (Anxiety). 60 tablet 5   sertraline (ZOLOFT) 100 MG tablet TAKE 2 TABLETS(200 MG) BY MOUTH AT BEDTIME 180 tablet 1   traMADol (ULTRAM) 50 MG tablet Take 1 tablet (50 mg total) by mouth 2 (two) times daily as needed. 60 tablet 5   traZODone (DESYREL) 100 MG tablet Take 1/2-1 tablet po QHS prn insomnia 90 tablet 0   vitamin C (ASCORBIC ACID) 500 MG tablet Take by oral route.     Current Facility-Administered Medications  Medication Dose Route  Frequency Provider Last Rate Last Admin   sodium chloride (PF) 0.9 % injection 2 mL  2 mL Intravenous Daily Raulkar, Drema Pry, MD   2 mL at 06/01/22 0943    Medication Side Effects: Other: Increased appetite, possible sleep disturbance  Allergies:  Allergies  Allergen Reactions   Citalopram Tinitus    Other reaction(s): Unknown   Escitalopram Tinitus    Other reaction(s): Unknown   Latex Swelling    Other reaction(s): Unknown Other reaction(s): other   Penicillins Swelling, Rash and Other (See Comments)    Has patient had a PCN reaction causing immediate rash, facial/tongue/throat swelling, SOB or lightheadedness with hypotension: Yes Has patient had a PCN reaction causing severe rash involving mucus membranes or skin necrosis: No Has patient had a PCN reaction that required hospitalization: No Has patient had a PCN reaction occurring  within the last 10 years: No If all of the above answers are "NO", then may proceed with Cephalosporin use.   Bee Venom Swelling   Duloxetine Hcl     -memory loss Other reaction(s): Unknown   Escitalopram Oxalate Tinitus   Other     Yeast creams- causes secondary infections, itching, burning, redness    Penicillin G     Other reaction(s): Unknown   Progesterone     Pt feels caused weakness, fatigue, cognitive deficits Other reaction(s): Unknown   Vortioxetine     Memory problems Other reaction(s): Unknown   Bupropion Other (See Comments)    weakness and malaise. Other reaction(s): Unknown   Duloxetine Other (See Comments)   Gabapentin Other (See Comments)    Could not sleep.      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   Please see review of systems for further details on the patient's review from today.   Objective:   Physical Exam:  There were no vitals taken for this visit.  Physical Exam Constitutional:      General: She is not in acute distress. Musculoskeletal:        General: No deformity.  Neurological:     Mental Status: She is alert and oriented to person, place, and time.     Coordination: Coordination normal.  Psychiatric:        Attention and Perception: Attention and perception normal. She does not perceive auditory or visual hallucinations.        Mood and Affect: Affect is not labile, blunt, angry or inappropriate.        Speech: Speech normal.        Behavior: Behavior normal.        Thought Content: Thought content normal. Thought content is not paranoid or delusional. Thought content does not include homicidal or suicidal ideation. Thought content does not include homicidal or suicidal plan.        Cognition and Memory: Cognition and memory normal.        Judgment: Judgment normal.     Comments: Insight intact Mood presents as less anxious and less depressed compared to last exam     Lab Review:     Component Value Date/Time   NA 132 (L) 07/22/2021 1351   NA 134 (L) 01/05/2017 1359   K 3.0 (L) 07/22/2021 1351   K 3.9 01/05/2017 1359   CL 97 (L) 07/22/2021 1351   CO2 25 07/22/2021 1351   CO2 25 01/05/2017 1359   GLUCOSE 101 (H) 07/22/2021 1351   GLUCOSE 106 01/05/2017 1359   BUN 6 07/22/2021 1351   BUN 10.2 01/05/2017 1359   CREATININE 0.67 07/22/2021 1351   CREATININE 0.8 01/05/2017 1359   CALCIUM 9.2 07/22/2021 1351   CALCIUM 9.5 01/05/2017 1359   PROT 7.3 01/15/2021 1238   PROT 6.4 01/05/2017 1359   ALBUMIN 4.6 01/15/2021  1238   ALBUMIN 4.2 01/05/2017 1359   AST 25 01/15/2021 1238    AST 18 01/05/2017 1359   ALT 24 01/15/2021 1238   ALT 15 01/05/2017 1359   ALKPHOS 46 01/15/2021 1238   ALKPHOS 60 01/05/2017 1359   BILITOT 0.8 01/15/2021 1238   BILITOT 0.22 01/05/2017 1359   GFRNONAA >60 07/22/2021 1351   GFRAA >60 12/04/2016 1548       Component Value Date/Time   WBC 9.2 07/22/2021 1351   RBC 3.88 07/22/2021 1351   HGB 12.8 07/22/2021 1351   HGB 13.4 01/05/2017 1359   HCT 37.0 07/22/2021 1351   HCT 39.0 01/05/2017 1359   PLT 387 07/22/2021 1351   PLT 274 01/05/2017 1359   MCV 95.4 07/22/2021 1351   MCV 97.5 01/05/2017 1359   MCH 33.0 07/22/2021 1351   MCHC 34.6 07/22/2021 1351   RDW 12.4 07/22/2021 1351   RDW 12.4 01/05/2017 1359   LYMPHSABS 2.4 07/22/2021 1351   LYMPHSABS 3.0 01/05/2017 1359   MONOABS 1.0 07/22/2021 1351   MONOABS 0.6 01/05/2017 1359   EOSABS 0.1 07/22/2021 1351   EOSABS 0.1 01/05/2017 1359   BASOSABS 0.0 07/22/2021 1351   BASOSABS 0.0 01/05/2017 1359    No results found for: "POCLITH", "LITHIUM"   No results found for: "PHENYTOIN", "PHENOBARB", "VALPROATE", "CBMZ"   .res Assessment: Plan:    Pt seen for 35 minutes and time spent discussing response to Vraylar and possible alternatives to Vraylar that may have a similar benefit with less risk of side effects. Discussed potential benefits, risks, and side effects of Rexulti as an alternative to Vraylar since it has a similar mechanism of action. Discussed that Rexulti also can potentially cause weight gain and can be discontinued if she notices weight gain and/or food cravings with Rexulti. Also discussed considering trial of Modafinil/Armodafinil to possibly improve energy. Pt reports that she would like to start trial of Rexulti. Will start Rexulti 0.5 mg daily. Pt provided with Rexulti starter pack. She reports that she may want to increase dose to 1 mg depending on response and tolerability. Discussed that Arman Filter has a long half-life and it may take several weeks before it washes  out and side effects resolve.  Will continue all other medications as prescribed.  Pt to follow-up with this provider in 6 weeks or sooner if clinically indicated.  Patient advised to contact office with any questions, adverse effects, or acute worsening in signs and symptoms.    Diagnoses and all orders for this visit:  Generalized anxiety disorder -     Brexpiprazole (REXULTI) 0.5 MG TABS; Take 1 tablet (0.5 mg total) by mouth daily. -     Brexpiprazole (REXULTI) 0.5 MG TABS; Take 1 tablet (0.5 mg total) by mouth daily.  Primary insomnia -     traZODone (DESYREL) 100 MG tablet; Take 1/2-1 tablet po QHS prn insomnia  MDD (recurrent major depressive disorder) in remission (HCC) -     Brexpiprazole (REXULTI) 0.5 MG TABS; Take 1 tablet (0.5 mg total) by mouth daily. -     Brexpiprazole (REXULTI) 0.5 MG TABS; Take 1 tablet (0.5 mg total) by mouth daily.     Please see After Visit Summary for patient specific instructions.  Future Appointments  Date Time Provider [place]  07/03/2022 11:20 AM Raulkar, Clide Deutscher, MD [facility]  07/11/2022  1:00 PM Raulkar, Clide Deutscher, MD [facility]  08/07/2022  4:00 PM Thayer Headings, PMHNP [facility] None    No orders  of the defined types were placed in this encounter.   -------------------------------

## 2022-07-03 ENCOUNTER — Encounter
Payer: PRIVATE HEALTH INSURANCE | Attending: Physical Medicine and Rehabilitation | Admitting: Physical Medicine and Rehabilitation

## 2022-07-03 ENCOUNTER — Encounter: Payer: Self-pay | Admitting: Physical Medicine and Rehabilitation

## 2022-07-03 VITALS — BP 118/84 | HR 76 | Ht 65.0 in

## 2022-07-03 DIAGNOSIS — M7918 Myalgia, other site: Secondary | ICD-10-CM | POA: Insufficient documentation

## 2022-07-03 DIAGNOSIS — M47817 Spondylosis without myelopathy or radiculopathy, lumbosacral region: Secondary | ICD-10-CM | POA: Insufficient documentation

## 2022-07-03 MED ORDER — LIDOCAINE HCL 1 % IJ SOLN
5.0000 mL | Freq: Once | INTRAMUSCULAR | Status: AC
  Administered 2022-07-03: 5 mL

## 2022-07-03 NOTE — Progress Notes (Signed)
Trigger Point Injection  Indication: Cervical myofascial pain not relieved by medication management and other conservative care.  Informed consent was obtained after describing risk and benefits of the procedure with the patient, this includes bleeding, bruising, infection and medication side effects.  The patient wishes to proceed and has given written consent.  The patient was placed in a seated position.  The area of pain was marked and prepped with Betadine.  It was entered with a 25-gauge 1/2 inch needle and a total of 5 mL of 1% lidocaine and normal saline was injected into a total of 8 trigger points, after negative draw back for blood.  The patient tolerated the procedure well.  Post procedure instructions were given.  

## 2022-07-03 NOTE — Progress Notes (Signed)
Subjective:    Patient ID: Faith Thomas, female    DOB: Feb 28, 1961, 62 y.o.   MRN: [id number]   Faith Thomas is a 62 year old woman who presents for f/u of her fibromyalgia, facet arthropathy, myofascial pain, and lumbar spinal stenosis.   She received her radiofrequency nerve ablation from Dr. Letta Pate on 7/22 of her left L5 dorsal ramus and left L3 and L4 medial branches. She had good benefit from this. We had discussed that this would help her back pain but not her leg pain. We tried Qutenza for her back pain and leg pain previously with good benefit. She would like to repeat today. Areas of pain today are left buttock and left side of thoracic and lumbar spine.   -she would like to try Qutenza again ins 6 weeks -she recently did cupping, it seems to help more when her therapist does this for her than when she does this herself -she has been caring for her granddaughter recently since her daughter needs help with childcare -pain has worsened a little from prior but is overall much better than it was in the past, she notes that she is not sure whether it is the trigger point injections, the Qutneza patches, the physical therapy, or the yoga that is most helping her, or a combination of these.  -she felt sick doing the Whole 30 but she felt really sick- she couldn't walk up the stairs- she quit this and felt better and added in electrolytes. She added back in dairy (yogurt, cheese, not whole milk), increased the olive oil and salt. She visited on a friend on vacation. She visited a friend from [place] and they did cooking lessons together. She uses a lot more olive oil.  -her weight today is 141, down 7 lbs from last viist! -pain is well controlled, she is ready to try Qutenza patches today Her sodium is always low normal and it tanked.  -she is happy with current system of alternating trigger point injections and Qutenza treatments -she continues to be able to tolerate work and her  daily yoga.  Pain is radiating into left leg, but to a lesser extend than it did before Last time we had done a 40 minute Qutenza appointment, discussed trying 30 minute appointment as this seems to be long enough to provide relief. Pain is overall better between her exercise, physical therapy, Qutenza, and trigger point injections.   She is on Eliquis (prescribed by her PCP, she does not see a cardiologist), which she stopped three days prior to prior ESI. She knows to have driver on the day of procedures.   She also has a painful thoracic paraspinal spasm on left side. She has greatly benefited from trigger points for this. This radiates down to her lumbar spine and she is not sure if it is connected to her lower back pain.   She has a There Cane and uses a soft foam roller and rolls on that regularly. She has a left sided thoracic parasponal muscle spasm. Ice helps decrease the pain.  She would like to be able to have less pain in order to do more activities with her 44 year old granddaughter.    She is doing Noom, a weight loss app based on psychology. Being aware of emoitional eating, lost a little bit of weight. Her goal is to get down to 135. Height is 5'5."   She has been using ginger a fair amount. It caused a lot of  gastric issues.   She has had very good benefit from trigger point injections.   She has been doing meditation, using the 10% Happier app, with good benefits. At times she does have a lot of fatigue.   She has lost 5 lbs in the past month!  Average pain is 4/5, pain right now is 5/10.  Has been having a sinus infection for 4-6 weeks She was having yellow mucus drainage Stress has dramatically increased She had to move her dad back from [place] right before Thanksgiving He has a history of TBI and has a hard time distinguishing between needs and wants.   Pain Inventory Average Pain 4 Pain Right Now 5 My pain is sharp, burning, dull, stabbing, tingling and  aching  In the last 24 hours, has pain interfered with the following? General activity 4 Relation with others 3 Enjoyment of life 4 What TIME of day is your pain at its worst? varies Sleep (in general) Fair  Pain is worse with: walking, bending, inactivity and standing Pain improves with: rest, heat/ice, therapy/exercise, medication and injections Relief from Meds: 6  Family History  Problem Relation Age of Onset   Arthritis Mother    Hypertension Mother    Mental illness Mother    Anxiety disorder Mother    Depression Mother    Arthritis Father    Hypertension Father    Mental illness Father    Anxiety disorder Father    Depression Father    Mental illness Brother    ADD / ADHD Brother    Psychosis Brother    Post-traumatic stress disorder Brother    Suicidality Brother    Cancer Maternal Aunt    Autism Maternal Aunt    Depression Maternal Aunt    Arthritis Maternal Grandfather    Hypertension Maternal Grandmother    Depression Cousin    Anxiety disorder Cousin    Arthritis Daughter    Autism Daughter    Social History   Socioeconomic History   Marital status: Divorced    Spouse name: Not on file   Number of children: Not on file   Years of education: Not on file   Highest education level: Not on file  Occupational History   Not on file  Tobacco Use   Smoking status: Never   Smokeless tobacco: Never  Vaping Use   Vaping Use: Never used  Substance and Sexual Activity   Alcohol use: Yes    Alcohol/week: 0.0 standard drinks of alcohol    Comment: occasional    Drug use: No   Sexual activity: Not on file  Other Topics Concern   Not on file  Social History Narrative   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 No pertinent surgical history. History reviewed. No pertinent surgical history. 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   HENT: Negative.    Eyes: Negative.   Respiratory: Negative.    Cardiovascular: Negative.   Gastrointestinal: Negative.   Endocrine: Negative.   Genitourinary: Negative.   Musculoskeletal: Negative.   Skin: Negative.   Allergic/Immunologic: Negative.   Neurological: Negative.   Hematological: Negative.   Psychiatric/Behavioral: Negative.    All other systems reviewed and are negative.      Objective:   Physical Exam Gen: no distress, normal appearing, weight 140 lbs HEENT: oral mucosa pink and moist, NCAT Cardio: Reg rate Chest: normal effort, normal rate of breathing Abd: soft, non-distended Ext: no edema Skin: intact, improved musculature, no evidence of open lesions or sunburn. Neuro: Alert and oriented x3 Musculoskeletal: No longer with pain radiating down leg in L5 and S1 nerve root distribution! Full and painless flexion and extension. Left sided  thoracic paraspinal and gluteal trigger points. Tight cervical myofascia Psych: pleasant, normal affect    Assessment & Plan:  1) Lumbar spondylosis with neurogenic claudication, left side 2) Bilateral facet joint arthritis, lumbar 3) left sided thoracic paraspinal spasm   -Faith Thomas's chief complaint today is left sided thoracic and gluteal pain. Her left sided sciatica in the L5 and S1 distribution resolved with prior Qutenza treatment! On physical exam, she has painless FROM on flexion with her bilateral low back pain. On last visit this was reproduced with extension but currently she has full and painless extension. Her pain has been most consistent with left L5 and S1 nerve root impingement secondary to stenosis and lower back pain secondary to facet arthropathy, which is seen on her most recent MRI, which shows worsening facet arthropathy as well as left sided annular fissure; results reviewed and discussed with patient previously. She would benefit and is agreeable to a course of physical therapy focused on core strengthening. She would like to hold off on starting this until COVID-19 cases have reduced. For now, she will continue her daily yoga practice (she performed 2 hours today and does so regularly), which has been shown to help low back pain and which helps her.  -continue tramadol -continue physical therapy -Discussed Qutenza as an option for neuropathic pain control. Discussed that this is a capsaicin patch, stronger than capsaicin cream. Discussed that it is currently approved for diabetic peripheral neuropathy and post-herpetic neuralgia, but that it has also shown benefit in treating other forms of neuropathy. Provided patient with link to site to learn more about the patch: CinemaBonus.fr. Discussed that the patch would be placed in office and benefits usually last 3 months. Discussed that unintended exposure to capsaicin can cause severe  irritation of eyes, mucous  membranes, respiratory tract, and skin, but that Qutenza is a local treatment and does not have the systemic side effects of other nerve medications. Discussed that there may be pain, itching, erythema, and decreased sensory function associated with the application of Qutenza. Side effects usually subside within 1 week. A cold pack of analgesic medications can help with these side effects. Blood pressure can also be increased due to pain associated with administration of the patch.  2 patches of Qutenza was applied to the area of pain. Ice packs were applied during the procedure to ensure patient comfort. Blood pressure was monitored every 15 minutes. The patient tolerated the procedure well. Post-procedure instructions were given and follow-up has been scheduled.   -Discussed current symptoms of pain and history of pain.  -Discussed benefits of exercise in reducing pain. -Discussed following foods that may reduce pain: 1) Ginger (especially studied for arthritis)- reduce leukotriene production to decrease inflammation 2) Blueberries- high in phytonutrients that decrease inflammation 3) Salmon- marine omega-3s reduce joint swelling and pain 4) Pumpkin seeds- reduce inflammation 5) dark chocolate- reduces inflammation 6) turmeric- reduces inflammation 7) tart cherries - reduce pain and stiffness 8) extra virgin olive oil - its compound olecanthal helps to block prostaglandins  9) chili peppers- can be eaten or applied topically via capsaicin 10) mint- helpful for headache, muscle aches, joint pain, and itching 11) garlic- reduces inflammation  Link to further information on diet for chronic pain: http://www.randall.com/   -Good benefit from left thoracic paraspinal trigger point injections in the past  -She had good benefit from radiofrequency nerve ablation with Dr. Letta Pate of left L5 dorsal ramus and left L3 and L4 medial  branches. We educated her that this would not help with her leg pain and now she would like to address this. Has also had temporary relief from left L5 and S1 ESI and full relief since last Qutenza administration.    -continue Robaxin to 750mg  TID and Tramadol 50mg  TID PRN which are working for her and which she is tolerating without side effects, on an as needed basis.   4) Fibromyalgia with myalgias -Patient's symptoms have greatly improved with her change in job, allowing her to sleep better. Continue daily yoga and meditation for stress relief. Discussed the goal of increasing her aerobic exercise to 15 minutes of daily outdoor walking for now.  -Recommended continuing NAC (N-Acetyl cysteine) 600mg  BID for her fatigue. Discussed its benefits in boosting glutathione and mitochondrial function. -continue fibromyalgia relief journal -Provided with a pain relief journal and discussed that it contains foods and lifestyle tips to naturally help to improve pain. Discussed that these lifestyle strategies are also very good for health unlike some medications which can have negative side effects. Discussed that the act of keeping a journal can be therapeutic and helpful to realize patterns what helps to trigger and alleviate pain.   -recommended doTerra Deep Blue Essential oil and applied to area of pain today- discussed that this is made of natural plant oils. Shared by personal experience of benefit from use of this essential oil -provided a link to a pdf of Pete Escogue's musculoskeletal alignment exercises -continue trigger point injections every 6 weeks.  Prescribed Zynex heating/cooling blanket    5) Osteopenia: Counseled regarding supplements, diet, and exercise to improve bone density. She is also following with rheumatology and on Fosfomax, as well as with a dietician. She has been following diet plan well. Advised regarding risk of corticosteroids when osteopenic.  6) Hypotension: BP recently  112/82. Advised that she log her BP daily and bring reads to follow-up appointment with me or PCP so medication can be appropriately adjusted.  -Advised regarding healthy foods that can help lower blood pressure and provided with a list: 1) citrus foods- high in vitamins and minerals 2) salmon and other fatty fish - reduces inflammation and oxylipins 3) swiss chard (leafy green)- high level of nitrates 4) pumpkin seeds- one of the best natural sources of magnesium 5) Beans and lentils- high in fiber, magnesium, and potassium 6) Berries- high in flavonoids 7) Amaranth (whole grain, can be cooked similarly to rice and oats)- high in magnesium and fiber 8) Pistachios- even more effective at reducing BP than other nuts 9) Carrots- high in phenolic compounds that relax blood vessels and reduce inflammation 10) Celery- contain phthalides that relax tissues of arterial walls 11) Tomatoes- can also improve cholesterol and reduce risk of heart disease 12) Broccoli- good source of magnesium, calcium, and potassium 13) Greek yogurt: high in potassium and calcium 14) Herbs and spices: Celery seed, cilantro, saffron, lemongrass, black cumin, ginseng, cinnamon, cardamom, sweet basil, and ginger 15) Chia and flax seeds- also help to lower cholesterol and blood sugar 16) Beets- high levels of nitrates that relax blood vessels  17) spinach and bananas- high in potassium  -Provided lise of supplements that can help with hypertension:  1) magnesium: one high quality brand is Bioptemizers since it contains all 7 types of magnesium, otherwise over the counter magnesium gluconate 400mg  is a good option 2) B vitamins 3) vitamin D 4) potassium 5) CoQ10 6) L-arginine 7) Vitamin C 8) Beetroot -Educated that goal BP is 120/80. -Made goal to incorporate some of the above foods into diet.    7) General health: provided dietary counseling.   --Follow up in 1 months for continued management of above conditions.     8) Thoracic myofascial pain: good benefit with trigger point injections, repeat in 6 weeks and schedule for every 3 months throughout the year, apply Qutenza patches today Trigger Point Injection Prescribed Zynex Nexwave heating/cooling blanket   Indication: Thoracic myofascial pain not relieved by medication management and other conservative care.  Informed consent was obtained after describing risk and benefits of the procedure with the patient, this includes bleeding, bruising, infection and medication side effects.  The patient wishes to proceed and has given written consent.  The patient was placed in a seated position.  The area of pain was marked and prepped with Betadine.  It was entered with a 25-gauge 1/2 inch needle and a total of 5 mL of 1% lidocaine and normal saline was injected into a total of 4 trigger points, after negative draw back for blood.  The patient tolerated the procedure well.  Post procedure instructions were given.   9) Overweight BMI 25.79: -Educated that current weight is 140 lbs, commended on her 14 lb weight loss! -continue fish oil -Educated regarding health benefits of weight loss- for pain, general health, chronic disease prevention, immune health, mental health.  -Will monitor weight every visit.  -Consider Roobois tea daily.  -Discussed the benefits of intermittent fasting. -Discussed foods that can assist in weight loss: 1) leafy greens- high in fiber and nutrients 2) dark chocolate- improves metabolism (if prefer sweetened, best to sweeten with honey instead of sugar).  3) cruciferous vegetables- high in fiber and protein 4) full fat yogurt: high in healthy fat, protein, calcium, and probiotics 5) apples- high in a variety  of phytochemicals 6) nuts- high in fiber and protein that increase feelings of fullness 7) grapefruit: rich in nutrients, antioxidants, and fiber (not to be taken with anticoagulation) 8) beans- high in protein and fiber 9)  salmon- has high quality protein and healthy fats 10) green tea- rich in polyphenols 11) eggs- rich in choline and vitamin D 12) tuna- high protein, boosts metabolism 13) avocado- decreases visceral abdominal fat 14) chicken (pasture raised): high in protein and iron 15) blueberries- reduce abdominal fat and cholesterol 16) whole grains- decreases calories retained during digestion, speeds metabolism 17) chia seeds- curb appetite 18) chilies- increases fat metabolism  -Discussed supplements that can be used:  1) Metatrim 400mg  BID 30 minutes before breakfast and dinner  2) Sphaeranthus indicus and Garcinia mangostana (combinations of these and #1 can be found in capsicum and zychrome  3) green coffee bean extract 400mg  twice per day or Irvingia (african mango) 150 to 300mg  twice per day.  10) Sinus infections -recommended turmeric and ginger, steam inhalation.

## 2022-07-11 ENCOUNTER — Encounter: Payer: PRIVATE HEALTH INSURANCE | Admitting: Physical Medicine and Rehabilitation

## 2022-07-11 DIAGNOSIS — G629 Polyneuropathy, unspecified: Secondary | ICD-10-CM

## 2022-07-24 ENCOUNTER — Other Ambulatory Visit: Payer: Self-pay

## 2022-07-24 ENCOUNTER — Telehealth: Payer: Self-pay | Admitting: Psychiatry

## 2022-07-24 DIAGNOSIS — F411 Generalized anxiety disorder: Secondary | ICD-10-CM

## 2022-07-24 DIAGNOSIS — F3340 Major depressive disorder, recurrent, in remission, unspecified: Secondary | ICD-10-CM

## 2022-07-24 MED ORDER — BREXPIPRAZOLE 1 MG PO TABS: 1.0000 mg | ORAL_TABLET | Freq: Every day | ORAL | 2 refills | Status: DC

## 2022-07-24 NOTE — Telephone Encounter (Signed)
Prior Authorization was completed through [email or web address], questions answered and office notes submitted, [place] [id number] for Rexulti 1 mg. Response should be today. Rx submitted to [facility] per request.     Pt's ID# [id number]

## 2022-07-24 NOTE — Telephone Encounter (Signed)
Pt called this am very frustrated about message she left on 2/15 that did not get entered or taken care of. Glass blower/designer discussed this with her. She needs a PA for Rexulti at the pharmacy. She filled the 0.14m Rexulti she was only able to get #15 (not #30) from the pharmacy because it needed a PA. She filled that on 2/15 and has been taking 2- 0.554mto equal 1.4m38mer day and called our office to request a RX and a PA. Today I see no notes about the PA in the chart and patient is adamant she left a detailed message with someone about what she needed. She is taking 1.4mg34mxulti and will need a prescription [facility]. I have set aside 2 boxes of samples for her to pick up to allow [place] work on getting a PA. Discussed with her that [facility] is notoriously difficult to work with and this could be one source of the problem. I will head off the PA info with the pharmacy to assist . Please submit the RX and the PA today. [phone]-  [email or web address] to submit online

## 2022-08-01 ENCOUNTER — Telehealth: Payer: Self-pay | Admitting: Psychiatry

## 2022-08-01 ENCOUNTER — Other Ambulatory Visit: Payer: Self-pay | Admitting: Physical Medicine and Rehabilitation

## 2022-08-01 DIAGNOSIS — M4807 Spinal stenosis, lumbosacral region: Secondary | ICD-10-CM

## 2022-08-01 DIAGNOSIS — M7918 Myalgia, other site: Secondary | ICD-10-CM

## 2022-08-01 NOTE — Telephone Encounter (Signed)
Glass blower/designer called [facility] to check status of Rexulti PA. [facility] has the second submission from [place] for 1st level appeal. They predict that a decision will be made on the PA by 3/1. Notified patient of this and suggested she pick up more samples.

## 2022-08-03 ENCOUNTER — Telehealth: Payer: Self-pay | Admitting: Psychiatry

## 2022-08-03 NOTE — Telephone Encounter (Signed)
REXULTI 1.'0mg'$   approved thru [employer] [phone])  Approved from 08/02/22-08/02/23.  [place] # [id number]

## 2022-08-04 NOTE — Telephone Encounter (Signed)
Noted thanks for update. She has apt on Monday with Janett Billow so hopefully we will have a response/approval to let her know.

## 2022-08-07 ENCOUNTER — Ambulatory Visit: Payer: PRIVATE HEALTH INSURANCE | Admitting: Psychiatry

## 2022-08-21 ENCOUNTER — Ambulatory Visit (INDEPENDENT_AMBULATORY_CARE_PROVIDER_SITE_OTHER): Payer: PRIVATE HEALTH INSURANCE | Admitting: Psychiatry

## 2022-08-21 ENCOUNTER — Telehealth: Payer: Self-pay | Admitting: Physical Medicine and Rehabilitation

## 2022-08-21 ENCOUNTER — Encounter: Payer: Self-pay | Admitting: Psychiatry

## 2022-08-21 VITALS — Wt 150.0 lb

## 2022-08-21 DIAGNOSIS — F5101 Primary insomnia: Secondary | ICD-10-CM

## 2022-08-21 DIAGNOSIS — F410 Panic disorder [episodic paroxysmal anxiety] without agoraphobia: Secondary | ICD-10-CM

## 2022-08-21 DIAGNOSIS — F411 Generalized anxiety disorder: Secondary | ICD-10-CM | POA: Diagnosis not present

## 2022-08-21 DIAGNOSIS — Z1589 Genetic susceptibility to other disease: Secondary | ICD-10-CM

## 2022-08-21 DIAGNOSIS — F3340 Major depressive disorder, recurrent, in remission, unspecified: Secondary | ICD-10-CM

## 2022-08-21 MED ORDER — ALPRAZOLAM 0.5 MG PO TABS: ORAL_TABLET | ORAL | 5 refills | Status: DC

## 2022-08-21 MED ORDER — PROPRANOLOL HCL 10 MG PO TABS: 10.0000 mg | ORAL_TABLET | Freq: Two times a day (BID) | ORAL | 5 refills | Status: AC | PRN

## 2022-08-21 MED ORDER — SERTRALINE HCL 100 MG PO TABS: ORAL_TABLET | ORAL | 1 refills | Status: DC

## 2022-08-21 MED ORDER — TRAZODONE HCL 100 MG PO TABS: ORAL_TABLET | ORAL | 1 refills | Status: DC

## 2022-08-21 MED ORDER — L-METHYLFOLATE 15 MG PO TABS: 15.0000 mg | ORAL_TABLET | Freq: Every day | ORAL | 3 refills | Status: AC

## 2022-08-21 MED ORDER — BREXPIPRAZOLE 2 MG PO TABS: 2.0000 mg | ORAL_TABLET | Freq: Every day | ORAL | 4 refills | Status: DC

## 2022-08-21 MED ORDER — BUSPIRONE HCL 30 MG PO TABS: 30.0000 mg | ORAL_TABLET | Freq: Two times a day (BID) | ORAL | 1 refills | Status: DC

## 2022-08-21 NOTE — Progress Notes (Signed)
Faith Thomas [id number] [birth year] 62 y.o.  Subjective:   Patient ID:  Faith Thomas is a 62 y.o. (DOB 02-12-1961) female.  Chief Complaint:  Chief Complaint  Patient presents with   Fatigue   Depression   Anxiety    HPI Faith Thomas presents to the office today for follow-up of depression and anxiety.  She reports that Rexulti has been helpful- "I feel more able to get stuff done." She reports that she has been more productive recently compared to the last 3-6 months. Energy is improved but wishes it "could be better." She reports that she has noticed some weight gain. She reports that she has less cravings for sweets on Rexulti compared to [name].   She reports, "I'm still kind of depressed, mostly about family stuff." She reports some anxiety in response to work. "I now think I have more anxiety" than depression. She reports periods of "intense anxiety." She reports "some level of rumination about 90% of the time." She reports that she "cares less" about some family member's choices. She reports that she has rarely needed Xanax prn during the day. She has been tapering Xanax and is taking 0.5 mg every other night alternating with 0.75 mg every other night. Sleeping well. Denies anhedonia. Reports that concentration is "better than it was." She reports that she has to enforce boundaries with family about not disrupting her while she is working. Denies SI.   Father is "a little less erratic." Working on finding services for her father. Father will periodically spend excessively. Reports daughter is "angry again." Able to see granddaughter, Faith Thomas, periodically. Reports mother has been wanting to die and not taking care of herself. Has some friends. She reports concern about doing things with friends due to possible interruptions from family members in crisis.   Alprazolam last filled 08/18/22.   Past Medication Trials: Buspar-Effective Trintellix- memory  difficulties Sertraline- Effective. Sexual side effects.  Cymbalta- memory difficulties Prozac-stomach pain Paxil Celexa Lexapro Remeron- Effective Wellbutrin XL- Irritability Vraylar-Helpful for energy and motivation. Increased cravings for sugar.  Lamictal- ineffective Klonopin Xanax Propranolol Doxepin Trazodone- stopped working [employer] Lyrica- hypomania Gabapentin- sleep disturbance Amitriptyline-sleeplessness, ineffective.  [place] Office Visit from 08/21/2022 in [facility] Office Visit from 06/26/2022 in [place] Total Score 0 1      PHQ2-9    [place] Office Visit from 07/03/2022 in [place] Visit from 12/13/2021 in [place] Visit from 11/28/2021 in [place] Visit from 09/26/2021 in [place] Visit from 04/07/2021 in [place]  PHQ-2 Total Score 0 0 0 0 6      [place] ED from 07/22/2021 in [facility] Emergency Department at [facility] ED from 01/15/2021 in [facility] Emergency Department at [facility] ED from 10/14/2020 in [facility] Emergency Department at [place] No Risk No Risk No Risk        Review of Systems:  Review of Systems  Musculoskeletal:  Negative for gait problem.       She reports some improvement in pain in left side.   Neurological:  Negative for tremors.  Psychiatric/Behavioral:         Please refer to HPI    Medications: I have reviewed the  patient's current medications.  Current Outpatient Medications  Medication Sig Dispense Refill   acetaminophen (TYLENOL) 500 MG tablet Take 1,000 mg by mouth every 8 (eight) hours as needed.      alendronate (FOSAMAX) 70 MG tablet  Take by mouth.     apixaban (ELIQUIS) 5 MG TABS tablet Take 1 tablet (5 mg total) by mouth 2 (two) times daily. (Patient taking differently: Take 2.5 mg by mouth daily.) 180 tablet 1   B Complex-C (VITAMIN B + C COMPLEX PO) Take by mouth.     CALCIUM PO Take by mouth.     celecoxib (CELEBREX) 100 MG capsule Take by mouth.     cetirizine (ZYRTEC) 10 MG tablet Take 10 mg by mouth daily as needed for allergies.     estradiol (ESTRACE) 0.1 MG/GM vaginal cream Place vaginally.     Estradiol 10 MCG TABS vaginal tablet Place 1 tablet vaginally 2 (two) times a week.     fluticasone (FLONASE) 50 MCG/ACT nasal spray Place into both nostrils daily as needed.     hydroxychloroquine (PLAQUENIL) 200 MG tablet Take by mouth.     lisinopril (ZESTRIL) 10 MG tablet Take 1 tablet by mouth daily.     Melatonin 3 MG CAPS Take by mouth.     methocarbamol (ROBAXIN) 750 MG tablet TAKE 1 TABLET(750 MG) BY MOUTH THREE TIMES DAILY 90 tablet 3   Multiple Vitamin (MULTIVITAMIN) tablet Take 1 tablet by mouth daily.     Omega-3 Fatty Acids (OMEGA 3 PO) Take by mouth.     Probiotic Product (PROBIOTIC PO) Take 1 capsule by mouth daily.      traMADol (ULTRAM) 50 MG tablet TAKE 1 TABLET(50 MG) BY MOUTH THREE TIMES DAILY AS NEEDED 90 tablet 3   vitamin C (ASCORBIC ACID) 500 MG tablet Take by oral route.     [START ON 09/15/2022] ALPRAZolam (XANAX) 0.5 MG tablet Take 1.5 tabs po QHS and 1/2 tablet twice daily as needed 75 tablet 5   brexpiprazole (REXULTI) 2 MG TABS tablet Take 1 tablet (2 mg total) by mouth daily. 30 tablet 4   busPIRone (BUSPAR) 30 MG tablet Take 1 tablet (30 mg total) by mouth 2 (two) times daily. 180 tablet 1   Cholecalciferol (VITAMIN D3) 10 MCG (400 UNIT) CAPS Vitamin D3 (Patient not taking: Reported on 08/21/2022)     L-Methylfolate 15 MG TABS Take 1 tablet (15 mg total) by mouth daily. 90 tablet 3   propranolol (INDERAL) 10 MG tablet Take 1 tablet (10 mg total) by mouth 2 (two) times daily as needed  (Anxiety). 60 tablet 5   sertraline (ZOLOFT) 100 MG tablet TAKE 2 TABLETS(200 MG) BY MOUTH AT BEDTIME 180 tablet 1   traZODone (DESYREL) 100 MG tablet Take 1/2-1 tablet po QHS prn insomnia 90 tablet 1   Current Facility-Administered Medications  Medication Dose Route Frequency Provider Last Rate Last Admin   sodium chloride (PF) 0.9 % injection 2 mL  2 mL Intravenous Daily Raulkar, Clide Deutscher, MD   2 mL at 06/01/22 0943    Medication Side Effects: Other: Some increase in appetite.   Allergies:  Allergies  Allergen Reactions   Citalopram Tinitus    Other reaction(s): Unknown   Escitalopram Tinitus    Other reaction(s): Unknown   Latex Swelling    Other reaction(s): Unknown Other reaction(s): other   Penicillins Swelling, Rash and Other (See Comments)    Has patient had a PCN reaction causing immediate rash, facial/tongue/throat swelling, SOB or  lightheadedness with hypotension: Yes Has patient had a PCN reaction causing severe rash involving mucus membranes or skin necrosis: No Has patient had a PCN reaction that required hospitalization: No Has patient had a PCN reaction occurring within the last 10 years: No If all of the above answers are "NO", then may proceed with Cephalosporin use.   Bee Venom Swelling   Duloxetine Hcl     -memory loss Other reaction(s): Unknown   Escitalopram Oxalate Tinitus   Other     Yeast creams- causes secondary infections, itching, burning, redness    Penicillin G     Other reaction(s): Unknown   Progesterone     Pt feels caused weakness, fatigue, cognitive deficits Other reaction(s): Unknown   Vortioxetine     Memory problems Other reaction(s): Unknown   Bupropion Other (See Comments)    weakness and malaise. Other reaction(s): Unknown   Duloxetine Other (See Comments)   Gabapentin Other (See Comments)    Could not sleep.      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   Please see review of systems for further details on the patient's review from today.   Objective:   Physical Exam:  Wt 150 lb (68 kg)   BMI 24.96 kg/m   Physical Exam Constitutional:      General: She is not in acute distress. Musculoskeletal:        General: No deformity.  Neurological:     Mental Status: She is alert and oriented to person, place, and time.     Coordination: Coordination normal.  Psychiatric:        Attention and Perception: Attention and perception normal. She does not perceive auditory or visual hallucinations.        Mood and Affect: Affect is not labile, blunt, angry or inappropriate.        Speech: Speech normal.        Behavior: Behavior normal.        Thought Content: Thought content normal. Thought content is not paranoid or delusional. Thought content does not include homicidal or suicidal ideation. Thought content does not include homicidal or suicidal plan.        Cognition and Memory: Cognition and memory normal.        Judgment: Judgment normal.     Comments: Insight intact Mood is mildly depressed and anxious     Lab Review:     Component Value Date/Time   NA 132 (L) 07/22/2021 1351   NA 134 (L) 01/05/2017 1359   K 3.0 (L) 07/22/2021 1351   K 3.9 01/05/2017 1359   CL 97 (L) 07/22/2021 1351   CO2 25 07/22/2021 1351   CO2 25 01/05/2017 1359   GLUCOSE 101 (H) 07/22/2021 1351   GLUCOSE 106 01/05/2017 1359   BUN 6 07/22/2021 1351   BUN 10.2 01/05/2017 1359   CREATININE 0.67 07/22/2021 1351  CREATININE 0.8  01/05/2017 1359   CALCIUM 9.2 07/22/2021 1351   CALCIUM 9.5 01/05/2017 1359   PROT 7.3 01/15/2021 1238   PROT 6.4 01/05/2017 1359   ALBUMIN 4.6 01/15/2021 1238   ALBUMIN 4.2 01/05/2017 1359   AST 25 01/15/2021 1238   AST 18 01/05/2017 1359   ALT 24 01/15/2021 1238   ALT 15 01/05/2017 1359   ALKPHOS 46 01/15/2021 1238   ALKPHOS 60 01/05/2017 1359   BILITOT 0.8 01/15/2021 1238   BILITOT 0.22 01/05/2017 1359   GFRNONAA >60 07/22/2021 1351   GFRAA >60 12/04/2016 1548       Component Value Date/Time   WBC 9.2 07/22/2021 1351   RBC 3.88 07/22/2021 1351   HGB 12.8 07/22/2021 1351   HGB 13.4 01/05/2017 1359   HCT 37.0 07/22/2021 1351   HCT 39.0 01/05/2017 1359   PLT 387 07/22/2021 1351   PLT 274 01/05/2017 1359   MCV 95.4 07/22/2021 1351   MCV 97.5 01/05/2017 1359   MCH 33.0 07/22/2021 1351   MCHC 34.6 07/22/2021 1351   RDW 12.4 07/22/2021 1351   RDW 12.4 01/05/2017 1359   LYMPHSABS 2.4 07/22/2021 1351   LYMPHSABS 3.0 01/05/2017 1359   MONOABS 1.0 07/22/2021 1351   MONOABS 0.6 01/05/2017 1359   EOSABS 0.1 07/22/2021 1351   EOSABS 0.1 01/05/2017 1359   BASOSABS 0.0 07/22/2021 1351   BASOSABS 0.0 01/05/2017 1359    No results found for: "POCLITH", "LITHIUM"   No results found for: "PHENYTOIN", "PHENOBARB", "VALPROATE", "CBMZ"   .res Assessment: Plan:    Discussed potential benefits, risks, and side effects of increasing Rexulti to 2 mg po qd. Pt agrees to trial of Rexulti 2 mg po qd for depression. Advised pt to contact office if she experiences weight gain or other adverse effects with dose increase.  Continue Sertraline 200 mg po qd for anxiety and depression.  Continue Buspar 30 mg po BID for anxiety.  Continue L-Methylfolate 15 mg po qd for MTHFR mutation and depression.  Continue Alprazolam 0.5 mg 1.5 tabs po QHS and 1/2 tab po BID prn anxiety.  Continue Propranolol 10 mg po BID prn anxiety.  Continue Trazodone 100 mg 1/2-1 tab po QHS prn insomnia.  Pt to  follow-up in 4 months or sooner if clinically indicated.  Patient advised to contact office with any questions, adverse effects, or acute worsening in signs and symptoms.   Lianne was seen today for fatigue, depression and anxiety.  Diagnoses and all orders for this visit:  Generalized anxiety disorder -     ALPRAZolam (XANAX) 0.5 MG tablet; Take 1.5 tabs po QHS and 1/2 tablet twice daily as needed -     sertraline (ZOLOFT) 100 MG tablet; TAKE 2 TABLETS(200 MG) BY MOUTH AT BEDTIME -     busPIRone (BUSPAR) 30 MG tablet; Take 1 tablet (30 mg total) by mouth 2 (two) times daily.  MDD (recurrent major depressive disorder) -     brexpiprazole (REXULTI) 2 MG TABS tablet; Take 1 tablet (2 mg total) by mouth daily. -     sertraline (ZOLOFT) 100 MG tablet; TAKE 2 TABLETS(200 MG) BY MOUTH AT BEDTIME -     L-Methylfolate 15 MG TABS; Take 1 tablet (15 mg total) by mouth daily.  Primary insomnia -     ALPRAZolam (XANAX) 0.5 MG tablet; Take 1.5 tabs po QHS and 1/2 tablet twice daily as needed -     traZODone (DESYREL) 100 MG tablet; Take 1/2-1 tablet po QHS prn  insomnia  Panic disorder -     propranolol (INDERAL) 10 MG tablet; Take 1 tablet (10 mg total) by mouth 2 (two) times daily as needed (Anxiety).  Homozygous for MTHFR gene mutation -     L-Methylfolate 15 MG TABS; Take 1 tablet (15 mg total) by mouth daily.     Please see After Visit Summary for patient specific instructions.  Future Appointments  Date Time Provider [place]  08/22/2022  9:20 AM Raulkar, Clide Deutscher, MD [facility]  12/19/2022  8:45 AM Thayer Headings, PMHNP [facility] None    No orders of the defined types were placed in this encounter.   -------------------------------

## 2022-08-21 NOTE — Progress Notes (Unsigned)
Subjective:    Patient ID: Faith Thomas, female    DOB: Feb 28, 1961, 62 y.o.   MRN: [id number]   Mrs. Faith Thomas is a 62 year old woman who presents for f/u of her fibromyalgia, facet arthropathy, myofascial pain, and lumbar spinal stenosis.   She received her radiofrequency nerve ablation from Dr. Letta Pate on 7/22 of her left L5 dorsal ramus and left L3 and L4 medial branches. She had good benefit from this. We had discussed that this would help her back pain but not her leg pain. We tried Qutenza for her back pain and leg pain previously with good benefit. She would like to repeat today. Areas of pain today are left buttock and left side of thoracic and lumbar spine.   -she would like to try Qutenza again ins 6 weeks -she recently did cupping, it seems to help more when her therapist does this for her than when she does this herself -she has been caring for her granddaughter recently since her daughter needs help with childcare -pain has worsened a little from prior but is overall much better than it was in the past, she notes that she is not sure whether it is the trigger point injections, the Qutneza patches, the physical therapy, or the yoga that is most helping her, or a combination of these.  -she felt sick doing the Whole 30 but she felt really sick- she couldn't walk up the stairs- she quit this and felt better and added in electrolytes. She added back in dairy (yogurt, cheese, not whole milk), increased the olive oil and salt. She visited on a friend on vacation. She visited a friend from [place] and they did cooking lessons together. She uses a lot more olive oil.  -her weight today is 141, down 7 lbs from last viist! -pain is well controlled, she is ready to try Qutenza patches today Her sodium is always low normal and it tanked.  -she is happy with current system of alternating trigger point injections and Qutenza treatments -she continues to be able to tolerate work and her  daily yoga.  Pain is radiating into left leg, but to a lesser extend than it did before Last time we had done a 40 minute Qutenza appointment, discussed trying 30 minute appointment as this seems to be long enough to provide relief. Pain is overall better between her exercise, physical therapy, Qutenza, and trigger point injections.   She is on Eliquis (prescribed by her PCP, she does not see a cardiologist), which she stopped three days prior to prior ESI. She knows to have driver on the day of procedures.   She also has a painful thoracic paraspinal spasm on left side. She has greatly benefited from trigger points for this. This radiates down to her lumbar spine and she is not sure if it is connected to her lower back pain.   She has a There Cane and uses a soft foam roller and rolls on that regularly. She has a left sided thoracic parasponal muscle spasm. Ice helps decrease the pain.  She would like to be able to have less pain in order to do more activities with her 44 year old granddaughter.    She is doing Noom, a weight loss app based on psychology. Being aware of emoitional eating, lost a little bit of weight. Her goal is to get down to 135. Height is 5'5."   She has been using ginger a fair amount. It caused a lot of  gastric issues.   She has had very good benefit from trigger point injections.   She has been doing meditation, using the 10% Happier app, with good benefits. At times she does have a lot of fatigue.   She has lost 5 lbs in the past month!  Average pain is 4/5, pain right now is 5/10.  Has been having a sinus infection for 4-6 weeks She was having yellow mucus drainage Stress has dramatically increased She had to move her dad back from [place] right before Thanksgiving He has a history of TBI and has a hard time distinguishing between needs and wants.   Pain Inventory Average Pain 4 Pain Right Now 5 My pain is sharp, burning, dull, stabbing, tingling and  aching  In the last 24 hours, has pain interfered with the following? General activity 4 Relation with others 3 Enjoyment of life 4 What TIME of day is your pain at its worst? varies Sleep (in general) Fair  Pain is worse with: walking, bending, inactivity and standing Pain improves with: rest, heat/ice, therapy/exercise, medication and injections Relief from Meds: 6  Family History  Problem Relation Age of Onset  . Arthritis Mother   . Hypertension Mother   . Mental illness Mother   . Anxiety disorder Mother   . Depression Mother   . Arthritis Father   . Hypertension Father   . Mental illness Father   . Anxiety disorder Father   . Depression Father   . Mental illness Brother   . ADD / ADHD Brother   . Psychosis Brother   . Post-traumatic stress disorder Brother   . Suicidality Brother   . Cancer Maternal Aunt   . Autism Maternal Aunt   . Depression Maternal Aunt   . Arthritis Maternal Grandfather   . Hypertension Maternal Grandmother   . Depression Cousin   . Anxiety disorder Cousin   . Arthritis Daughter   . Autism Daughter    Social History   Socioeconomic History  . Marital status: Divorced    Spouse name: Not on file  . Number of children: Not on file  . Years of education: Not on file  . Highest education level: Not on file  Occupational History  . Not on file  Tobacco Use  . Smoking status: Never  . Smokeless tobacco: Never  Vaping Use  . Vaping Use: Never used  Substance and Sexual Activity  . Alcohol use: Yes    Alcohol/week: 0.0 standard drinks of alcohol    Comment: occasional   . Drug use: No  . Sexual activity: Not on file  Other Topics Concern  . 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 No past surgical history on file. Past Medical History:  Diagnosis Date  . Allergy   . Depression   . DVT (deep venous thrombosis) ([place])   . Fibromyalgia   . Genetic defect   . GERD (gastroesophageal reflux disease)   . History of IBS   . 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 Hypertension   . Osteoporosis   . PE (pulmonary thromboembolism) ([place])   .  Positive TB test   . Raynaud disease   . Scoliosis   . Sleep apnea   . Urine incontinence    There were no vitals taken for this visit.  Opioid Risk Score:   Fall Risk Score:  `1  Depression screen [facility] 2/9     07/03/2022   11:20 AM 12/13/2021    9:16 AM 11/28/2021    3:17 PM 09/26/2021   10:04 AM 04/07/2021   10:33 AM 03/11/2021    2:26 PM 02/03/2021   10:42 AM  Depression screen [facility] 2/9  Decreased Interest 0 0 0 0 3 0 0  Down, Depressed, Hopeless 0 0 0 0 3 0 0  PHQ - 2 Score 0 0 0 0 6 0 0    Review of Systems  Constitutional: Negative.   HENT: Negative.    Eyes: Negative.   Respiratory: Negative.    Cardiovascular: Negative.   Gastrointestinal: Negative.   Endocrine: Negative.   Genitourinary: Negative.   Musculoskeletal: Negative.   Skin: Negative.   Allergic/Immunologic: Negative.   Neurological: Negative.   Hematological: Negative.   Psychiatric/Behavioral: Negative.    All other systems reviewed and are negative.      Objective:   Physical Exam Gen: no distress, normal appearing, weight 140 lbs HEENT: oral mucosa pink and moist, NCAT Cardio: Reg rate Chest: normal effort, normal rate of breathing Abd: soft, non-distended Ext: no edema Skin: intact, improved musculature, no evidence of open lesions or sunburn. Neuro: Alert and oriented x3 Musculoskeletal: No longer with pain radiating down leg in L5 and S1 nerve root distribution! Full and painless flexion and extension. Left sided thoracic  paraspinal and gluteal trigger points. Tight cervical myofascia Psych: pleasant, normal affect    Assessment & Plan:  1) Lumbar spondylosis with neurogenic claudication, left side 2) Bilateral facet joint arthritis, lumbar 3) left sided thoracic paraspinal spasm   -Mrs. Erby's chief complaint today is left sided thoracic and gluteal pain. Her left sided sciatica in the L5 and S1 distribution resolved with prior Qutenza treatment! On physical exam, she has painless FROM on flexion with her bilateral low back pain. On last visit this was reproduced with extension but currently she has full and painless extension. Her pain has been most consistent with left L5 and S1 nerve root impingement secondary to stenosis and lower back pain secondary to facet arthropathy, which is seen on her most recent MRI, which shows worsening facet arthropathy as well as left sided annular fissure; results reviewed and discussed with patient previously. She would benefit and is agreeable to a course of physical therapy focused on core strengthening. She would like to hold off on starting this until COVID-19 cases have reduced. For now, she will continue her daily yoga practice (she performed 2 hours today and does so regularly), which has been shown to help low back pain and which helps her.  -continue tramadol -continue physical therapy -Discussed Qutenza as an option for neuropathic pain control. Discussed that this is a capsaicin patch, stronger than capsaicin cream. Discussed that it is currently approved for diabetic peripheral neuropathy and post-herpetic neuralgia, but that it has also shown benefit in treating other forms of neuropathy. Provided patient with link to site to learn more about the patch: CinemaBonus.fr. Discussed that the patch would be placed in office and benefits usually last 3 months. Discussed that unintended exposure to capsaicin can cause severe irritation of eyes, mucous membranes,  respiratory tract, and skin, but that Qutenza is a  local treatment and does not have the systemic side effects of other nerve medications. Discussed that there may be pain, itching, erythema, and decreased sensory function associated with the application of Qutenza. Side effects usually subside within 1 week. A cold pack of analgesic medications can help with these side effects. Blood pressure can also be increased due to pain associated with administration of the patch.  2 patches of Qutenza was applied to the area of pain. Ice packs were applied during the procedure to ensure patient comfort. Blood pressure was monitored every 15 minutes. The patient tolerated the procedure well. Post-procedure instructions were given and follow-up has been scheduled.   -Discussed current symptoms of pain and history of pain.  -Discussed benefits of exercise in reducing pain. -Discussed following foods that may reduce pain: 1) Ginger (especially studied for arthritis)- reduce leukotriene production to decrease inflammation 2) Blueberries- high in phytonutrients that decrease inflammation 3) Salmon- marine omega-3s reduce joint swelling and pain 4) Pumpkin seeds- reduce inflammation 5) dark chocolate- reduces inflammation 6) turmeric- reduces inflammation 7) tart cherries - reduce pain and stiffness 8) extra virgin olive oil - its compound olecanthal helps to block prostaglandins  9) chili peppers- can be eaten or applied topically via capsaicin 10) mint- helpful for headache, muscle aches, joint pain, and itching 11) garlic- reduces inflammation  Link to further information on diet for chronic pain: http://www.randall.com/   -Good benefit from left thoracic paraspinal trigger point injections in the past  -She had good benefit from radiofrequency nerve ablation with Dr. Letta Pate of left L5 dorsal ramus and left L3 and L4 medial branches. We  educated her that this would not help with her leg pain and now she would like to address this. Has also had temporary relief from left L5 and S1 ESI and full relief since last Qutenza administration.    -continue Robaxin to 750mg  TID and Tramadol 50mg  TID PRN which are working for her and which she is tolerating without side effects, on an as needed basis.   4) Fibromyalgia with myalgias -Patient's symptoms have greatly improved with her change in job, allowing her to sleep better. Continue daily yoga and meditation for stress relief. Discussed the goal of increasing her aerobic exercise to 15 minutes of daily outdoor walking for now.  -Recommended continuing NAC (N-Acetyl cysteine) 600mg  BID for her fatigue. Discussed its benefits in boosting glutathione and mitochondrial function. -continue fibromyalgia relief journal -Provided with a pain relief journal and discussed that it contains foods and lifestyle tips to naturally help to improve pain. Discussed that these lifestyle strategies are also very good for health unlike some medications which can have negative side effects. Discussed that the act of keeping a journal can be therapeutic and helpful to realize patterns what helps to trigger and alleviate pain.   -recommended doTerra Deep Blue Essential oil and applied to area of pain today- discussed that this is made of natural plant oils. Shared by personal experience of benefit from use of this essential oil -provided a link to a pdf of Pete Escogue's musculoskeletal alignment exercises -continue trigger point injections every 6 weeks.  Prescribed Zynex heating/cooling blanket    5) Osteopenia: Counseled regarding supplements, diet, and exercise to improve bone density. She is also following with rheumatology and on Fosfomax, as well as with a dietician. She has been following diet plan well. Advised regarding risk of corticosteroids when osteopenic.    6) Hypotension: BP recently 112/82.  Advised that she log her BP  daily and bring reads to follow-up appointment with me or PCP so medication can be appropriately adjusted.  -Advised regarding healthy foods that can help lower blood pressure and provided with a list: 1) citrus foods- high in vitamins and minerals 2) salmon and other fatty fish - reduces inflammation and oxylipins 3) swiss chard (leafy green)- high level of nitrates 4) pumpkin seeds- one of the best natural sources of magnesium 5) Beans and lentils- high in fiber, magnesium, and potassium 6) Berries- high in flavonoids 7) Amaranth (whole grain, can be cooked similarly to rice and oats)- high in magnesium and fiber 8) Pistachios- even more effective at reducing BP than other nuts 9) Carrots- high in phenolic compounds that relax blood vessels and reduce inflammation 10) Celery- contain phthalides that relax tissues of arterial walls 11) Tomatoes- can also improve cholesterol and reduce risk of heart disease 12) Broccoli- good source of magnesium, calcium, and potassium 13) Greek yogurt: high in potassium and calcium 14) Herbs and spices: Celery seed, cilantro, saffron, lemongrass, black cumin, ginseng, cinnamon, cardamom, sweet basil, and ginger 15) Chia and flax seeds- also help to lower cholesterol and blood sugar 16) Beets- high levels of nitrates that relax blood vessels  17) spinach and bananas- high in potassium  -Provided lise of supplements that can help with hypertension:  1) magnesium: one high quality brand is Bioptemizers since it contains all 7 types of magnesium, otherwise over the counter magnesium gluconate 400mg  is a good option 2) B vitamins 3) vitamin D 4) potassium 5) CoQ10 6) L-arginine 7) Vitamin C 8) Beetroot -Educated that goal BP is 120/80. -Made goal to incorporate some of the above foods into diet.    7) General health: provided dietary counseling.   --Follow up in 1 months for continued management of above conditions.    8)  Thoracic myofascial pain: good benefit with trigger point injections, repeat in 6 weeks and schedule for every 3 months throughout the year, apply Qutenza patches today Trigger Point Injection Prescribed Zynex Nexwave heating/cooling blanket   Indication: Thoracic myofascial pain not relieved by medication management and other conservative care.  Informed consent was obtained after describing risk and benefits of the procedure with the patient, this includes bleeding, bruising, infection and medication side effects.  The patient wishes to proceed and has given written consent.  The patient was placed in a seated position.  The area of pain was marked and prepped with Betadine.  It was entered with a 25-gauge 1/2 inch needle and a total of 5 mL of 1% lidocaine and normal saline was injected into a total of 4 trigger points, after negative draw back for blood.  The patient tolerated the procedure well.  Post procedure instructions were given.   9) Overweight BMI 25.79: -Educated that current weight is 140 lbs, commended on her 14 lb weight loss! -continue fish oil -Educated regarding health benefits of weight loss- for pain, general health, chronic disease prevention, immune health, mental health.  -Will monitor weight every visit.  -Consider Roobois tea daily.  -Discussed the benefits of intermittent fasting. -Discussed foods that can assist in weight loss: 1) leafy greens- high in fiber and nutrients 2) dark chocolate- improves metabolism (if prefer sweetened, best to sweeten with honey instead of sugar).  3) cruciferous vegetables- high in fiber and protein 4) full fat yogurt: high in healthy fat, protein, calcium, and probiotics 5) apples- high in a variety of phytochemicals 6) nuts- high in fiber and protein that increase feelings  of fullness 7) grapefruit: rich in nutrients, antioxidants, and fiber (not to be taken with anticoagulation) 8) beans- high in protein and fiber 9) salmon- has  high quality protein and healthy fats 10) green tea- rich in polyphenols 11) eggs- rich in choline and vitamin D 12) tuna- high protein, boosts metabolism 13) avocado- decreases visceral abdominal fat 14) chicken (pasture raised): high in protein and iron 15) blueberries- reduce abdominal fat and cholesterol 16) whole grains- decreases calories retained during digestion, speeds metabolism 17) chia seeds- curb appetite 18) chilies- increases fat metabolism  -Discussed supplements that can be used:  1) Metatrim 400mg  BID 30 minutes before breakfast and dinner  2) Sphaeranthus indicus and Garcinia mangostana (combinations of these and #1 can be found in capsicum and zychrome  3) green coffee bean extract 400mg  twice per day or Irvingia (african mango) 150 to 300mg  twice per day.  10) Sinus infections -recommended turmeric and ginger, steam inhalation.

## 2022-08-21 NOTE — Telephone Encounter (Signed)
Patient canceled her TP and Qutenza appointments, she is unable to afford them.  She is going to keep her appointment tomorrow with you to discuss medications and how often she needs to come to get refills.

## 2022-08-22 ENCOUNTER — Encounter: Payer: Self-pay | Admitting: Physical Medicine and Rehabilitation

## 2022-08-22 ENCOUNTER — Encounter
Payer: PRIVATE HEALTH INSURANCE | Attending: Physical Medicine and Rehabilitation | Admitting: Physical Medicine and Rehabilitation

## 2022-08-22 VITALS — BP 108/76 | HR 88 | Ht 65.0 in | Wt 150.0 lb

## 2022-08-22 DIAGNOSIS — S76012A Strain of muscle, fascia and tendon of left hip, initial encounter: Secondary | ICD-10-CM | POA: Diagnosis present

## 2022-08-22 DIAGNOSIS — Z79891 Long term (current) use of opiate analgesic: Secondary | ICD-10-CM | POA: Insufficient documentation

## 2022-08-22 DIAGNOSIS — M7918 Myalgia, other site: Secondary | ICD-10-CM | POA: Diagnosis not present

## 2022-08-22 DIAGNOSIS — G894 Chronic pain syndrome: Secondary | ICD-10-CM | POA: Insufficient documentation

## 2022-08-22 DIAGNOSIS — M4807 Spinal stenosis, lumbosacral region: Secondary | ICD-10-CM | POA: Insufficient documentation

## 2022-08-22 DIAGNOSIS — Z5181 Encounter for therapeutic drug level monitoring: Secondary | ICD-10-CM | POA: Diagnosis not present

## 2022-08-22 MED ORDER — METHOCARBAMOL 750 MG PO TABS: 750.0000 mg | ORAL_TABLET | Freq: Four times a day (QID) | ORAL | 3 refills | Status: DC | PRN

## 2022-08-22 MED ORDER — TRAMADOL HCL 50 MG PO TABS: 50.0000 mg | ORAL_TABLET | Freq: Three times a day (TID) | ORAL | 3 refills | Status: DC | PRN

## 2022-08-25 ENCOUNTER — Telehealth: Payer: Self-pay

## 2022-08-25 LAB — TOXASSURE SELECT,+ANTIDEPR,UR

## 2022-08-25 NOTE — Telephone Encounter (Signed)
Prior Authorization initiated with [email or web address] for Rexulti 2 mg, dose increase from 1 mg, office notes submitted as well for review.  [id number] [employer]  [id number]

## 2022-08-25 NOTE — Telephone Encounter (Signed)
RX Benefits approved REXULTI 2.0mg   (08/25/22-08/24/23) RX [phone]

## 2022-09-08 ENCOUNTER — Telehealth: Payer: Self-pay | Admitting: *Deleted

## 2022-09-08 NOTE — Telephone Encounter (Signed)
Urine drug screen for this encounter is consistent for prescribed medication 

## 2022-09-19 ENCOUNTER — Other Ambulatory Visit: Payer: Self-pay | Admitting: Physical Medicine and Rehabilitation

## 2022-09-19 ENCOUNTER — Encounter: Payer: Self-pay | Admitting: Physical Medicine and Rehabilitation

## 2022-09-19 MED ORDER — DICLOFENAC SODIUM 1 % EX GEL: 2.0000 g | Freq: Four times a day (QID) | CUTANEOUS | 3 refills | Status: AC

## 2022-10-09 ENCOUNTER — Ambulatory Visit: Payer: PRIVATE HEALTH INSURANCE | Admitting: Physical Medicine and Rehabilitation

## 2022-11-17 ENCOUNTER — Ambulatory Visit: Payer: PRIVATE HEALTH INSURANCE | Admitting: Physical Medicine and Rehabilitation

## 2022-12-19 ENCOUNTER — Encounter: Payer: Self-pay | Admitting: Psychiatry

## 2022-12-19 ENCOUNTER — Ambulatory Visit (INDEPENDENT_AMBULATORY_CARE_PROVIDER_SITE_OTHER): Payer: PRIVATE HEALTH INSURANCE | Admitting: Psychiatry

## 2022-12-19 VITALS — BP 128/81 | HR 71

## 2022-12-19 DIAGNOSIS — F3340 Major depressive disorder, recurrent, in remission, unspecified: Secondary | ICD-10-CM | POA: Diagnosis not present

## 2022-12-19 DIAGNOSIS — F5101 Primary insomnia: Secondary | ICD-10-CM | POA: Diagnosis not present

## 2022-12-19 DIAGNOSIS — F411 Generalized anxiety disorder: Secondary | ICD-10-CM

## 2022-12-19 DIAGNOSIS — F410 Panic disorder [episodic paroxysmal anxiety] without agoraphobia: Secondary | ICD-10-CM | POA: Diagnosis not present

## 2022-12-19 MED ORDER — BUSPIRONE HCL 30 MG PO TABS
30.0000 mg | ORAL_TABLET | Freq: Two times a day (BID) | ORAL | 1 refills | Status: DC
Start: 2022-12-19 — End: 2023-02-26

## 2022-12-19 NOTE — Progress Notes (Signed)
Faith Thomas [id number] 02-07-1961 62 y.o.  Subjective:   Patient ID:  Faith Thomas is a 62 y.o. (DOB 1960-08-31) female.  Chief Complaint:  Chief Complaint  Patient presents with   Anxiety         HPI Faith Thomas presents to the office today for follow-up of anxiety, depression, and insomnia. She reports that anxiety is "just unbearable." She has been needing to take Xanax in the daytime. She reports that her stomach is in knots with anxiety and reports that she feels like she is on the verge of a panic attack at work and has difficulty concentrate at work. She reports that Xanax helps with her anxiety, however she notices more sleepiness and cognitive side effects. She reports feeling uncomfortable within an hour after taking Rexulti, which is also when she starts her work day. Anxiety starts to decrease around 2-3 pm. She reports worry and rumination and "just that feeling that things are not ok at all."   She reports that she has been more active since starting Rexulti. She reports feeling disconnected from people and things. She reports some anhedonia- "I can't really feel joy. I can feel pleasantness." She reports that she would like to laugh more easily. She reports that her depression "was better, but now the anxiety is getting so high it is making me feel pretty hopeless." Energy is "better." Motivation is "some better." Sleeping well without taking Xanax at bedtime. Reports gaining 12 lbs. "I feel like I am eating because I am anxious" and that appetite has been increased. Denies SI.   Job change in February and anxiety has been worse since then. Contemplating early retirement- "As it is, it's a struggle to get through day to day." She reports frequent changes at work communicated over e-mails and that expectations are not black and white.   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 Father had neuropsychological eval. Spending some  time with granddaughter.  Seeing therapist.   Reports taking 1/2 of Alprazolam 0.5 mg tabs in the morning and may b  Past Medication Trials: Buspar-Effective Trintellix- memory difficulties Sertraline- Effective. Sexual side effects.  Cymbalta- memory difficulties Prozac-stomach pain Paxil Celexa Lexapro Remeron- Effective Wellbutrin XL- Irritability, exhaustion Vraylar-Helpful for energy and motivation. Increased cravings for sugar.  Rexulti- increased anxiety Lamictal- ineffective Klonopin Xanax Propranolol Doxepin Trazodone- stopped working [employer] Lyrica- hypomania Gabapentin- sleep disturbance Amitriptyline-sleeplessness, ineffective.  AIMS    Flowsheet Row Office Visit from 12/19/2022 in [facility] Office Visit from 08/21/2022 in [facility] Office Visit from 06/26/2022 in [facility]  AIMS Total Score 0 0 1      PHQ2-9    Flowsheet Row Office Visit from 08/22/2022 in [facility] Office Visit from 07/03/2022 in [facility] Office Visit from 12/13/2021 in [facility] Rehabilitation Office Visit from 11/28/2021 in [facility] Office Visit from 09/26/2021 in [facility] Physical Medicine & Rehabilitation  PHQ-2 Total Score 2 0 0 0 0      Flowsheet Row ED from 07/22/2021 in [facility] Emergency Department at [facility] ED from 01/15/2021 in [facility] Emergency Department at [facility] ED from 10/14/2020 in [facility] Emergency Department at [facility]  C-SSRS RISK CATEGORY No Risk No Risk No Risk        Review of  Systems:  Review of Systems  Gastrointestinal:  Negative for nausea.  Musculoskeletal:  Negative for gait problem.  Psychiatric/Behavioral:         Please refer to HPI    Medications: I have  reviewed the patient's current medications.  Current Outpatient Medications  Medication Sig Dispense Refill   acetaminophen (TYLENOL) 500 MG tablet Take 1,000 mg by mouth every 8 (eight) hours as needed.      alendronate (FOSAMAX) 70 MG tablet Take by mouth.     ALPRAZolam (XANAX) 0.5 MG tablet Take 1.5 tabs po QHS and 1/2 tablet twice daily as needed 75 tablet 5   apixaban (ELIQUIS) 5 MG TABS tablet Take 1 tablet (5 mg total) by mouth 2 (two) times daily. 180 tablet 1   b complex vitamins capsule Take 1 capsule by mouth daily.     celecoxib (CELEBREX) 100 MG capsule Take by mouth.     estradiol (ESTRACE) 0.1 MG/GM vaginal cream Place vaginally.     Estradiol 10 MCG TABS vaginal tablet Place 1 tablet vaginally 2 (two) times a week.     fluticasone (FLONASE) 50 MCG/ACT nasal spray Place into both nostrils daily as needed.     L-Methylfolate 15 MG TABS Take 1 tablet (15 mg total) by mouth daily. 90 tablet 3   lisinopril (ZESTRIL) 10 MG tablet Take 1 tablet by mouth daily.     Melatonin 3 MG CAPS Take by mouth.     methocarbamol (ROBAXIN) 750 MG tablet Take 1 tablet (750 mg total) by mouth every 6 (six) hours as needed for muscle spasms. 90 tablet 3   Multiple Vitamin (MULTIVITAMIN) tablet Take 1 tablet by mouth daily.     Omega-3 Fatty Acids (OMEGA 3 PO) Take by mouth.     Probiotic Product (PROBIOTIC PO) Take 1 capsule by mouth daily.      propranolol (INDERAL) 10 MG tablet Take 1 tablet (10 mg total) by mouth 2 (two) times daily as needed (Anxiety). 60 tablet 5   sertraline (ZOLOFT) 100 MG tablet TAKE 2 TABLETS(200 MG) BY MOUTH AT BEDTIME 180 tablet 1   traMADol (ULTRAM) 50 MG tablet Take 1 tablet (50 mg total) by mouth 3 (three) times daily as needed. 90 tablet 3   traZODone (DESYREL) 100 MG tablet Take 1/2-1 tablet po QHS prn insomnia 90 tablet 1   vitamin C (ASCORBIC ACID) 500 MG tablet Take by oral route.     busPIRone (BUSPAR) 30 MG tablet Take 1 tablet (30 mg total) by mouth 2 (two)  times daily. 180 tablet 1   cetirizine (ZYRTEC) 10 MG tablet Take 10 mg by mouth daily as needed for allergies. (Patient not taking: Reported on 12/19/2022)     diclofenac Sodium (VOLTAREN ARTHRITIS PAIN) 1 % GEL Apply 2 g topically 4 (four) times daily. (Patient not taking: Reported on 12/19/2022) 50 g 3   Current Facility-Administered Medications  Medication Dose Route Frequency Provider Last Rate Last Admin   sodium chloride (PF) 0.9 % injection 2 mL  2 mL Intravenous Daily Raulkar, Drema Pry, MD   2 mL at 06/01/22 0943    Medication Side Effects: Other: Increased anxiety, cognitive side effects to include difficulty with word recall, anhedonia.   Allergies:  Allergies  Allergen Reactions   Citalopram Tinitus    Other reaction(s): Unknown   Escitalopram Tinitus    Other reaction(s): Unknown   Latex Swelling    Other reaction(s): Unknown Other reaction(s): other   Penicillins Swelling, Rash and Other (  See Comments)    Has patient had a PCN reaction causing immediate rash, facial/tongue/throat swelling, SOB or lightheadedness with hypotension: Yes Has patient had a PCN reaction causing severe rash involving mucus membranes or skin necrosis: No Has patient had a PCN reaction that required hospitalization: No Has patient had a PCN reaction occurring within the last 10 years: No If all of the above answers are "NO", then may proceed with Cephalosporin use.   Bee Venom Swelling   Duloxetine Hcl     -memory loss Other reaction(s): Unknown   Escitalopram Oxalate Tinitus   Other     Yeast creams- causes secondary infections, itching, burning, redness    Penicillin G     Other reaction(s): Unknown   Progesterone     Pt feels caused weakness, fatigue, cognitive deficits Other reaction(s): Unknown   Vortioxetine     Memory problems Other reaction(s): Unknown   Bupropion Other (See Comments)    weakness and malaise. Other reaction(s): Unknown   Duloxetine Other (See Comments)    Gabapentin Other (See Comments)    Could not sleep.      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   Please see review of systems for further details on the patient's review from today.   Objective:   Physical Exam:  BP 128/81   Pulse 71   Physical Exam Constitutional:      General: She is not in acute distress. Musculoskeletal:        General: No deformity.  Neurological:     Mental Status: She is alert and oriented to person, place, and time.     Coordination: Coordination normal.  Psychiatric:        Attention and Perception: Attention and perception normal. She does not perceive auditory or visual hallucinations.        Mood and Affect: Mood is anxious and depressed. Affect is not labile, blunt, angry or inappropriate.        Speech: Speech normal.        Behavior: Behavior normal.        Thought Content: Thought content normal. Thought content is not paranoid or delusional. Thought content does not include homicidal or suicidal ideation. Thought content does not include homicidal or suicidal plan.        Cognition and Memory: Cognition and memory normal.        Judgment: Judgment normal.     Comments: Insight intact     Lab Review:     Component Value Date/Time   NA 132 (L) 07/22/2021 1351   NA 134 (L) 01/05/2017 1359   K 3.0 (L) 07/22/2021 1351   K 3.9 01/05/2017 1359   CL 97 (L) 07/22/2021 1351   CO2 25 07/22/2021 1351   CO2 25 01/05/2017 1359   GLUCOSE 101 (H) 07/22/2021  1351   GLUCOSE 106 01/05/2017 1359   BUN 6 07/22/2021  1351   BUN 10.2 01/05/2017 1359   CREATININE 0.67 07/22/2021 1351   CREATININE 0.8 01/05/2017 1359   CALCIUM 9.2 07/22/2021 1351   CALCIUM 9.5 01/05/2017 1359   PROT 7.3 01/15/2021 1238   PROT 6.4 01/05/2017 1359   ALBUMIN 4.6 01/15/2021 1238   ALBUMIN 4.2 01/05/2017 1359   AST 25 01/15/2021 1238   AST 18 01/05/2017 1359   ALT 24 01/15/2021 1238   ALT 15 01/05/2017 1359   ALKPHOS 46 01/15/2021 1238   ALKPHOS 60 01/05/2017 1359   BILITOT 0.8 01/15/2021 1238   BILITOT 0.22 01/05/2017 1359   GFRNONAA >60 07/22/2021 1351   GFRAA >60 12/04/2016 1548       Component Value Date/Time   WBC 9.2 07/22/2021 1351   RBC 3.88 07/22/2021 1351   HGB 12.8 07/22/2021 1351   HGB 13.4 01/05/2017 1359   HCT 37.0 07/22/2021 1351   HCT 39.0 01/05/2017 1359   PLT 387 07/22/2021 1351   PLT 274 01/05/2017 1359   MCV 95.4 07/22/2021 1351   MCV 97.5 01/05/2017 1359   MCH 33.0 07/22/2021 1351   MCHC 34.6 07/22/2021 1351   RDW 12.4 07/22/2021 1351   RDW 12.4 01/05/2017 1359   LYMPHSABS 2.4 07/22/2021 1351   LYMPHSABS 3.0 01/05/2017 1359   MONOABS 1.0 07/22/2021 1351   MONOABS 0.6 01/05/2017 1359   EOSABS 0.1 07/22/2021 1351   EOSABS 0.1 01/05/2017 1359   BASOSABS 0.0 07/22/2021 1351   BASOSABS 0.0 01/05/2017 1359    No results found for: "POCLITH", "LITHIUM"   No results found for: "PHENYTOIN", "PHENOBARB", "VALPROATE", "CBMZ"   .res Assessment: Plan:    Will discontinue Rexulti since pt reports that she has had increased anxiety with Rexulti. Discussed that Rexulti has a long half-life and that it may take a few weeks for side effects to completely resolve. Recommended follow-up in 4 weeks to evaluate response to stopping Rexulti and discuss treatment options. Discussed considering starting either Viibryd or Auvelity at that time.  Discussed continuing to use Xanax and Propranolol prn for anxiety over the next couple of weeks until  anxiety improves.  Will continue Sertraline 200 mg po every day for anxiety and depression.  Continue Buspar 30 mg po BID for anxiety.  Continue L-methylfolate for depression and MTHFR mutation. Continue Trazodone 100 mg 1/2-1 tab po at bedtime prn insomnia . Pt to follow-up with this provider in 4 weeks or sooner if clinically indicated.  Recommend continuing psychotherapy.  Patient advised to contact office with any questions, adverse effects, or acute worsening in signs and symptoms.   Faith Thomas was seen today for anxiety.  Diagnoses and all orders for this visit:  Generalized anxiety disorder -     busPIRone (BUSPAR) 30 MG tablet; Take 1 tablet (30 mg total) by mouth 2 (two) times daily.  MDD (recurrent major depressive disorder)  Panic disorder  Primary insomnia     Please see After Visit Summary for patient specific instructions.  Future Appointments  Date Time Provider Department Center  01/25/2023  3:30 PM Corie Chiquito, PMHNP [facility] None  02/13/2023  9:20 AM Raulkar, Drema Pry, MD [facility]    No orders of the defined types were placed in this encounter.   -------------------------------

## 2023-01-25 ENCOUNTER — Ambulatory Visit (INDEPENDENT_AMBULATORY_CARE_PROVIDER_SITE_OTHER): Payer: PRIVATE HEALTH INSURANCE | Admitting: Psychiatry

## 2023-01-25 ENCOUNTER — Encounter: Payer: Self-pay | Admitting: Psychiatry

## 2023-01-25 DIAGNOSIS — F329 Major depressive disorder, single episode, unspecified: Secondary | ICD-10-CM

## 2023-01-25 DIAGNOSIS — F5101 Primary insomnia: Secondary | ICD-10-CM | POA: Diagnosis not present

## 2023-01-25 DIAGNOSIS — F3340 Major depressive disorder, recurrent, in remission, unspecified: Secondary | ICD-10-CM

## 2023-01-25 DIAGNOSIS — F411 Generalized anxiety disorder: Secondary | ICD-10-CM | POA: Diagnosis not present

## 2023-01-25 MED ORDER — ALPRAZOLAM 0.5 MG PO TABS
ORAL_TABLET | ORAL | 2 refills | Status: AC
Start: 2023-02-17 — End: ?

## 2023-01-25 MED ORDER — AUVELITY 45-105 MG PO TBCR: EXTENDED_RELEASE_TABLET | ORAL | Status: DC

## 2023-01-25 NOTE — Patient Instructions (Signed)
If Auvelity is not tolerated or ineffective, please contact office. Would then switch from Sertraline to Vilazodone (Viibryd).  Week 1: Continue Sertraline 200 mg daily Start Viibryd 10 mg 1/2 tablet daily with breakfast  Week 2: Decrease Sertraline to 150 mg daily Increase Viibryd to 10 mg daily with breakfast  Week 3: Decrease Sertraline to 100 mg daily Increase Viibryd to 15 mg daily  Week 4: Decrease Sertraline to 50 mg daily Increase Viibryd to 20 mg daily  Week 5: Stop Sertraline  Continue Viibryd 20 mg daily

## 2023-01-25 NOTE — Progress Notes (Signed)
Faith Thomas [id number] 24-Jul-1960 62 y.o.  Subjective:   Patient ID:  Faith Thomas is a 62 y.o. (DOB 08/09/1960) female.  Chief Complaint:  Chief Complaint  Patient presents with   Anxiety   Depression    HPI Faith Thomas presents to the office today for follow-up of depression, anxiety, and insomnia. Faith reports that Faith is continuing to experience panic and persistent depression. Faith reports that Faith has "pretty severe anxiety in the morning" and usually needs a full Xanax in the morning. Faith reports anxiety is "a lot better" in the afternoon, depending on what is going on. Sleeping ok. Energy and motivation have been low. Faith reports that Faith has unintentionally lost 10 lbs since stopping Rexulti. Faith notices less food cravings and desire for snacking. Concentration is limited when anxiety is higher.  Denies SI.  Stopped Rexulti 12/29/22. Faith reports that Faith was sick last week with a stomach virus for 5 days, "and I didn't have the overwhelming anxiety." Faith reports that anxiety in the morning has been slightly improved this week.   Faith reports that Faith is no longer taking Xanax to sleep. Faith reports that Faith is taking a 1/2 tab in the morning and occasionally a 1/2 tab.   Father is in ER. 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   Alprazolam last filled 12/15/22.   Past Medication Trials: Buspar-Effective Trintellix- memory difficulties Sertraline- Effective. Sexual side effects.  Cymbalta- memory difficulties Prozac-stomach pain Paxil Celexa Lexapro Remeron- Effective Wellbutrin XL- Irritability, exhaustion Vraylar-Helpful for energy and motivation. Increased cravings for sugar.  Rexulti- increased anxiety Lamictal- ineffective Klonopin Xanax Propranolol Doxepin Trazodone- stopped working [employer] Lyrica- hypomania Gabapentin- sleep disturbance Amitriptyline-sleeplessness, ineffective.  AIMS    Flowsheet Row Office Visit from 12/19/2022 in  [facility] Office Visit from 08/21/2022 in [facility] Office Visit from 06/26/2022 in [facility]  AIMS Total Score 0 0 1      PHQ2-9    Flowsheet Row Office Visit from 08/22/2022 in [facility] Office Visit from 07/03/2022 in [facility] Office Visit from 12/13/2021 in [facility] Office Visit from 11/28/2021 in [facility] Medicine & Rehabilitation Office Visit from 09/26/2021 in [facility] Physical Medicine & Rehabilitation  PHQ-2 Total Score 2 0 0 0 0      Flowsheet Row ED from 07/22/2021 in [facility] Emergency Department at [facility] ED from 01/15/2021 in [facility] Emergency Department at [facility] ED from 10/14/2020 in [facility] Emergency Department at [facility]  C-SSRS RISK CATEGORY No Risk No Risk No Risk        Review of Systems:  Review of Systems  Gastrointestinal:        Recent GI illness.  Musculoskeletal:  Negative for gait problem.  Neurological:  Negative for tremors.  Psychiatric/Behavioral:         Please refer to HPI    Medications: I have reviewed the patient's current medications.  Current Outpatient Medications  Medication Sig Dispense Refill   Dextromethorphan-buPROPion ER (AUVELITY) 45-105 MG TBCR Take 1 tablet daily for 3 days, then increase to 1 tablet twice daily     acetaminophen (TYLENOL) 500 MG tablet Take 1,000 mg by mouth every 8 (eight) hours as needed.      alendronate (FOSAMAX) 70 MG tablet Take by mouth.     [START ON 02/17/2023] ALPRAZolam (  XANAX) 0.5 MG tablet Take 1.5 tabs po QHS and 1/2 tablet twice daily as needed 75 tablet 2   apixaban (ELIQUIS) 5 MG TABS tablet Take 1 tablet (5 mg total) by mouth 2 (two) times daily. 180 tablet 1   b complex vitamins capsule Take 1 capsule by mouth daily.     busPIRone  (BUSPAR) 30 MG tablet Take 1 tablet (30 mg total) by mouth 2 (two) times daily. 180 tablet 1   celecoxib (CELEBREX) 100 MG capsule Take by mouth.     cetirizine (ZYRTEC) 10 MG tablet Take 10 mg by mouth daily as needed for allergies. (Patient not taking: Reported on 12/19/2022)     diclofenac Sodium (VOLTAREN ARTHRITIS PAIN) 1 % GEL Apply 2 g topically 4 (four) times daily. (Patient not taking: Reported on 12/19/2022) 50 g 3   estradiol (ESTRACE) 0.1 MG/GM vaginal cream Place vaginally.     Estradiol 10 MCG TABS vaginal tablet Place 1 tablet vaginally 2 (two) times a week.     fluticasone (FLONASE) 50 MCG/ACT nasal spray Place into both nostrils daily as needed.     L-Methylfolate 15 MG TABS Take 1 tablet (15 mg total) by mouth daily. 90 tablet 3   lisinopril (ZESTRIL) 10 MG tablet Take 1 tablet by mouth daily.     Melatonin 3 MG CAPS Take by mouth.     methocarbamol (ROBAXIN) 750 MG tablet Take 1 tablet (750 mg total) by mouth every 6 (six) hours as needed for muscle spasms. 90 tablet 3   Multiple Vitamin (MULTIVITAMIN) tablet Take 1 tablet by mouth daily.     Omega-3 Fatty Acids (OMEGA 3 PO) Take by mouth.     Probiotic Product (PROBIOTIC PO) Take 1 capsule by mouth daily.      propranolol (INDERAL) 10 MG tablet Take 1 tablet (10 mg total) by mouth 2 (two) times daily as needed (Anxiety). 60 tablet 5   sertraline (ZOLOFT) 100 MG tablet TAKE 2 TABLETS(200 MG) BY MOUTH AT BEDTIME 180 tablet 1   traMADol (ULTRAM) 50 MG tablet Take 1 tablet (50 mg total) by mouth 3 (three) times daily as needed. 90 tablet 3   traZODone (DESYREL) 100 MG tablet Take 1/2-1 tablet po QHS prn insomnia 90 tablet 1   vitamin C (ASCORBIC ACID) 500 MG tablet Take by oral route.     Current Facility-Administered Medications  Medication Dose Route Frequency Provider Last Rate Last Admin   sodium chloride (PF) 0.9 % injection 2 mL  2 mL Intravenous Daily Raulkar, Drema Pry, MD   2 mL at 06/01/22 0943    Medication Side  Effects: None  Allergies:  Allergies  Allergen Reactions   Citalopram Tinitus    Other reaction(s): Unknown   Escitalopram Tinitus    Other reaction(s): Unknown   Latex Swelling    Other reaction(s): Unknown Other reaction(s): other   Penicillins Swelling, Rash and Other (See Comments)    Has patient had a PCN reaction causing immediate rash, facial/tongue/throat swelling, SOB or lightheadedness with hypotension: Yes Has patient had a PCN reaction causing severe rash involving mucus membranes or skin necrosis: No Has patient had a PCN reaction that required hospitalization: No Has patient had a PCN reaction occurring within the last 10 years: No If all of the above answers are "NO", then may proceed with Cephalosporin use.   Bee Venom Swelling   Duloxetine Hcl     -memory loss Other reaction(s): Unknown   Escitalopram Oxalate Tinitus   Other  Yeast creams- causes secondary infections, itching, burning, redness    Penicillin G     Other reaction(s): Unknown   Progesterone     Pt feels caused weakness, fatigue, cognitive deficits Other reaction(s): Unknown   Vortioxetine     Memory problems Other reaction(s): Unknown   Bupropion Other (See Comments)    weakness and malaise. Other reaction(s): Unknown   Duloxetine Other (See Comments)   Gabapentin Other (See Comments)    Could not sleep.      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   Please see review of systems for further  details on the patient's review from today.   Objective:   Physical Exam:  There were no vitals taken for this visit.  Physical Exam Constitutional:      General: Faith is not in acute distress. Musculoskeletal:        General: No deformity.  Neurological:     Mental Status: Faith is alert and oriented to person, place, and time.     Coordination: Coordination normal.  Psychiatric:        Attention and Perception: Attention and perception normal. Faith does not perceive auditory or visual hallucinations.        Mood and Affect: Mood is anxious and depressed. Affect is not labile, blunt, angry or inappropriate.        Speech: Speech normal.        Behavior: Behavior normal.        Thought Content: Thought content normal. Thought content is not paranoid or delusional. Thought content does not include homicidal or suicidal ideation. Thought content does not include homicidal or suicidal plan.        Cognition and Memory: Cognition and memory normal.        Judgment: Judgment normal.     Comments: Insight intact     Lab Review:     Component Value Date/Time   NA 132 (L) 07/22/2021 1351   NA 134 (L) 01/05/2017 1359   K 3.0 (L) 07/22/2021 1351   K 3.9 01/05/2017 1359   CL 97 (L) 07/22/2021 1351   CO2 25 07/22/2021 1351   CO2 25 01/05/2017 1359   GLUCOSE 101 (H) 07/22/2021 1351   GLUCOSE 106 01/05/2017 1359   BUN 6 07/22/2021 1351   BUN 10.2 01/05/2017 1359   CREATININE 0.67 07/22/2021 1351   CREATININE 0.8 01/05/2017 1359   CALCIUM 9.2 07/22/2021 1351   CALCIUM 9.5 01/05/2017 1359   PROT 7.3 01/15/2021 1238   PROT 6.4 01/05/2017 1359   ALBUMIN 4.6 01/15/2021 1238   ALBUMIN 4.2 01/05/2017 1359   AST 25 01/15/2021 1238   AST 18 01/05/2017 1359   ALT 24 01/15/2021 1238   ALT 15 01/05/2017 1359   ALKPHOS 46 01/15/2021 1238   ALKPHOS 60 01/05/2017 1359   BILITOT 0.8 01/15/2021 1238   BILITOT 0.22 01/05/2017 1359  GFRNONAA >60 07/22/2021 1351   GFRAA >60 12/04/2016 1548        Component Value Date/Time   WBC 9.2 07/22/2021 1351   RBC 3.88 07/22/2021 1351   HGB 12.8 07/22/2021 1351   HGB 13.4 01/05/2017 1359   HCT 37.0 07/22/2021 1351   HCT 39.0 01/05/2017 1359   PLT 387 07/22/2021 1351   PLT 274 01/05/2017 1359   MCV 95.4 07/22/2021 1351   MCV 97.5 01/05/2017 1359   MCH 33.0 07/22/2021 1351   MCHC 34.6 07/22/2021 1351   RDW 12.4 07/22/2021 1351   RDW 12.4 01/05/2017 1359   LYMPHSABS 2.4 07/22/2021 1351   LYMPHSABS 3.0 01/05/2017 1359   MONOABS 1.0 07/22/2021 1351   MONOABS 0.6 01/05/2017 1359   EOSABS 0.1 07/22/2021 1351   EOSABS 0.1 01/05/2017 1359   BASOSABS 0.0 07/22/2021 1351   BASOSABS 0.0 01/05/2017 1359    No results found for: "POCLITH", "LITHIUM"   No results found for: "PHENYTOIN", "PHENOBARB", "VALPROATE", "CBMZ"   .res Assessment: Plan:    50 minutes spent dedicated to the care of this patient on the date of this encounter to include pre-visit review of records, ordering of medication, post visit documentation, and face-to-face time with the patient discussing potential benefits, risks, and side effects of possible treatment options for treatment resistant depression to include Spravato, Auvelity, and TMS. Discussed treatment frequency and duration of both TMS and Spravato. Pt reports that Faith may not be able to commit to treatment schedules for TMS or Spravato at this time due to caregiving responsibilities, however Faith may wish to consider his in the future.  Faith agrees to a trial of Auvelity. Will start Auvelity 45-105 mg one tablet daily for 3 days, then increase Auvelity to one tablet twice daily for depression. Faith reports concern about Auvelity causing extreme exhaustion and irritability since Faith experienced these side effects with Bupropion in the past. Advised pt to discontinue Auvelity if these side effects occur.  Faith reports that if Faith discontinues Auvelity due to side effects, Faith would like to then start trial of  Viibryd, which has been discussed as a possible treatment option during previous visits. Reviewed potential benefits, risks, and side effects of Viibryd. Discussed cross-titration of medications to minimize risk of discontinuation and possible side effects. If Auvelity is not tolerated or ineffective, please contact office. Would then switch from Sertraline to Vilazodone (Viibryd). Provided pt with the following cross-titration instructions if this were to occur:  Week 1: Continue Sertraline 200 mg daily Start Viibryd 10 mg 1/2 tablet daily with breakfast  Week 2: Decrease Sertraline to 150 mg daily Increase Viibryd to 10 mg daily with breakfast  Week 3: Decrease Sertraline to 100 mg daily Increase Viibryd to 15 mg daily  Week 4: Decrease Sertraline to 50 mg daily Increase Viibryd to 20 mg daily  Week 5: Stop Sertraline  Continue Viibryd 20 mg daily   Will continue Sertraline 200 mg at this time while starting trial of Auvelity. Recommended taking Alprazolam 0.5 mg 1/2 tablet at bedtime since Faith reports increased anxiety upon awakening which coincides with around the time Faith stopped taking Alprazolam at bedtime. Discussed that Faith may be experiencing physiological benzodiazepine withdrawal in the mornings. Will continue Buspar 30 mg po BID for anxiety. Continue Trazodone for insomnia.  Continue Propranolol as needed for anxiety.  Pt to follow-up with this provider in 6 weeks or sooner if clinically indicated.  Patient advised to contact office with any questions, adverse effects,  or acute worsening in signs and symptoms.    Faith Thomas was seen today for anxiety and depression.  Diagnoses and all orders for this visit:  MDD (recurrent major depressive disorder) in remission (HCC) -     Dextromethorphan-buPROPion ER (AUVELITY) 45-105 MG TBCR; Take 1 tablet daily for 3 days, then increase to 1 tablet twice daily  Generalized anxiety disorder -     ALPRAZolam (XANAX) 0.5 MG tablet;  Take 1.5 tabs po QHS and 1/2 tablet twice daily as needed  Primary insomnia -     ALPRAZolam (XANAX) 0.5 MG tablet; Take 1.5 tabs po QHS and 1/2 tablet twice daily as needed     Please see After Visit Summary for patient specific instructions.  Future Appointments  Date Time Provider Department Center  02/13/2023  9:20 AM Raulkar, Drema Pry, MD [facility]  02/26/2023  8:30 AM Corie Chiquito, PMHNP [facility] None    No orders of the defined types were placed in this encounter.   -------------------------------

## 2023-02-13 ENCOUNTER — Encounter
Payer: PRIVATE HEALTH INSURANCE | Attending: Physical Medicine and Rehabilitation | Admitting: Physical Medicine and Rehabilitation

## 2023-02-13 ENCOUNTER — Encounter: Payer: Self-pay | Admitting: Physical Medicine and Rehabilitation

## 2023-02-13 VITALS — BP 110/76 | HR 76 | Ht 65.0 in | Wt 141.0 lb

## 2023-02-13 DIAGNOSIS — G894 Chronic pain syndrome: Secondary | ICD-10-CM | POA: Diagnosis present

## 2023-02-13 DIAGNOSIS — F439 Reaction to severe stress, unspecified: Secondary | ICD-10-CM

## 2023-02-13 NOTE — Progress Notes (Signed)
Subjective:    Patient ID: Liliane Bade, female    DOB: 1961-05-12, 62 y.o.   MRN: [id number]   Mrs. Abraham is a 62 year old woman who presents for f/u of her fibromyalgia, facet arthropathy, myofascial pain, and lumbar spinal stenosis.   1)  She received her radiofrequency nerve ablation from Dr. Wynn Banker on 7/22 of her left L5 dorsal ramus and left L3 and L4 medial branches. She had good benefit from this. We had discussed that this would help her back pain but not her leg pain. We tried Qutenza for her back pain and leg pain previously with good benefit. She would like to repeat today. Areas of pain today are left buttock and left side of thoracic and lumbar spine.   -does not ned the Qutenza  -her pain is much better -her insurance changed and everything is much more expensive -she is still doing PT, yoga -she recently did cupping, it seems to help more when her therapist does this for her than when she does this herself -she has been caring for her granddaughter recently since her daughter needs help with childcare -pain has worsened a little from prior but is overall much better than it was in the past, she notes that she is not sure whether it is the trigger point injections, the Qutneza patches, the physical therapy, or the yoga that is most helping her, or a combination of these.  -she felt sick doing the Whole 30 but she felt really sick- she couldn't walk up the stairs- she quit this and felt better and added in electrolytes. She added back in dairy (yogurt, cheese, not whole milk), increased the olive oil and salt. She visited on a friend on vacation. She visited a friend from [place] and they did cooking lessons together. She uses a lot more olive oil.  -her weight today is 141, down 7 lbs from last viist! -pain is well controlled, she is ready to try Qutenza patches today Her sodium is always low normal and it tanked.  -she is happy with current system of alternating  trigger point injections and Qutenza treatments -she continues to be able to tolerate work and her daily yoga.  Pain is radiating into left leg, but to a lesser extend than it did before Last time we had done a 40 minute Qutenza appointment, discussed trying 30 minute appointment as this seems to be long enough to provide relief. Pain is overall better between her exercise, physical therapy, Qutenza, and trigger point injections.   She is on Eliquis (prescribed by her PCP, she does not see a cardiologist), which she stopped three days prior to prior ESI. She knows to have driver on the day of procedures.   She also has a painful thoracic paraspinal spasm on left side. She has greatly benefited from trigger points for this. This radiates down to her lumbar spine and she is not sure if it is connected to her lower back pain.   She has a There Cane and uses a soft foam roller and rolls on that regularly. She has a left sided thoracic parasponal muscle spasm. Ice helps decrease the pain.  She would like to be able to have less pain in order to do more activities with her 28 year old granddaughter.    She is doing Noom, a weight loss app based on psychology. Being aware of emoitional eating, lost a little bit of weight. Her goal is to get down to 135.  Height is 5'5."   She has been using ginger a fair amount. It caused a lot of gastric issues.   She has had very good benefit from trigger point injections.   She has been doing meditation, using the 10% Happier app, with good benefits. At times she does have a lot of fatigue.   She has lost 5 lbs in the past month!  Average pain is 4/5, pain right now is 5/10.  Has been having a sinus infection for 4-6 weeks She was having yellow mucus drainage Stress has dramatically increased She had to move her dad back from [place] right before Thanksgiving He has a history of TBI and has a hard time distinguishing between needs and wants.   1)  Myofasical pain -trigger point injections have been helping with the buttock trigger point -walking aggravates the pain -her goal is to be able to do multiple laps -this is her currently worst pain -she still has sciatic pain as well.   2) Left adductor inflammation: -PT told her that was the most inflamed adductor she has ever seen  3) Right shoulder pain  4) Stress -high levels of stress -working with a professional   Pain Inventory Average Pain 4 Pain Right Now 5 My pain is constant, dull, and aching  In the last 24 hours, has pain interfered with the following? General activity 5 Relation with others 3 Enjoyment of life 4 What TIME of day is your pain at its worst? varies Sleep (in general)  good  Pain is worse with: walking, bending, standing, and some activites Pain improves with: heat/ice, therapy/exercise, medication, and injections Relief from Meds: 7  Family History  Problem Relation Age of Onset   Arthritis Mother    Hypertension Mother    Mental illness Mother    Anxiety disorder Mother    Depression Mother    Arthritis Father    Hypertension Father    Mental illness Father    Anxiety disorder Father    Depression Father    Mental illness Brother    ADD / ADHD Brother    Psychosis Brother    Post-traumatic stress disorder Brother    Suicidality Brother    Cancer Maternal Aunt    Autism Maternal Aunt    Depression Maternal Aunt    Arthritis Maternal Grandfather    Hypertension Maternal Grandmother    Depression Cousin    Anxiety disorder Cousin    Arthritis Daughter    Autism Daughter    Social History   Socioeconomic History   Marital status: Divorced    Spouse name: Not on file   Number of children: Not on file   Years of education: Not on file   Highest education level: Not on file  Occupational History   Not on file  Tobacco Use   Smoking status: Never   Smokeless tobacco: Never  Vaping Use   Vaping status: Never Used   Substance and Sexual Activity   Alcohol use: Yes    Alcohol/week: 0.0 standard drinks of alcohol    Comment: occasional    Drug use: No   Sexual activity: Not on file  Other Topics Concern   Not on file  Social History Narrative   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 : No  Physical Activity: Not on file  Stress: Not on file  Social Connections: Not on file   No past surgical history on file. No past surgical history on file. 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   Hypertension    Osteoporosis    PE (pulmonary thromboembolism) (HCC)    Positive TB test    Raynaud disease    Scoliosis    Sleep apnea    Urine incontinence    BP 110/76   Pulse 76   Ht 5\' 5"  (1.651 m)   Wt 141 lb (64 kg)   SpO2 96%   BMI 23.46 kg/m   Opioid Risk Score:   Fall Risk Score:  `1  Depression screen [facility] 2/9     02/13/2023    9:01 AM 08/22/2022    9:12 AM 07/03/2022   11:20 AM 12/13/2021    9:16 AM 11/28/2021    3:17 PM 09/26/2021   10:04 AM 04/07/2021   10:33 AM  Depression screen PHQ 2/9  Decreased Interest 2 1 0 0 0 0 3  Down, Depressed, Hopeless 2 1 0 0 0 0 3  PHQ - 2 Score 4 2 0 0 0 0 6   Altered sleeping 1        Tired, decreased energy 2        Change in appetite 1        Feeling bad or failure about yourself  0        Trouble concentrating 1        Moving slowly or fidgety/restless 0        Suicidal thoughts 0        PHQ-9 Score 9          Review of Systems  Constitutional: Negative.   HENT: Negative.    Eyes: Negative.   Respiratory: Negative.    Cardiovascular: Negative.   Gastrointestinal: Negative.   Endocrine: Negative.   Genitourinary: Negative.   Musculoskeletal: Negative.   Skin: Negative.   Allergic/Immunologic: Negative.   Neurological: Negative.   Hematological: Negative.   Psychiatric/Behavioral: Negative.    All other systems reviewed and are negative.      Objective:   Physical Exam Gen: no distress, normal appearing, weight 150 lbs HEENT: oral mucosa pink and moist, NCAT Cardio: Reg rate Chest: normal effort, normal rate of breathing Abd: soft, non-distended Ext: no edema Skin: intact, improved musculature, no evidence of open lesions or sunburn. Neuro: Alert and oriented x3 Musculoskeletal: No longer with pain radiating down leg in L5 and S1 nerve root distribution! Full and painless flexion and extension. Left sided thoracic paraspinal and gluteal trigger points. Tight cervical myofascia Psych: pleasant, normal affect    Assessment & Plan:  1) Lumbar spondylosis with neurogenic claudication, left side 2) Bilateral  facet joint arthritis, lumbar 3) left sided thoracic paraspinal spasm  -robaxin refilled.  -discussed that pain has greatly improved -Mrs. Sidor's chief complaint today is left sided thoracic and gluteal pain. Her left sided sciatica in the L5 and S1 distribution resolved with prior Qutenza treatment! On physical exam, she has painless FROM on flexion with her bilateral low back pain. On last visit this was reproduced with extension but currently she has full and painless extension. Her pain has been most consistent  with left L5 and S1 nerve root impingement secondary to stenosis and lower back pain secondary to facet arthropathy, which is seen on her most recent MRI, which shows worsening facet arthropathy as well as left sided annular fissure; results reviewed and discussed with patient previously. She would benefit and is agreeable to a course of physical therapy focused on core strengthening. She would like to hold off on starting this until COVID-19 cases have reduced. For now, she will continue her daily yoga practice (she performed 2 hours today and does so regularly), which has been shown to help low back pain and which helps her.  -continue tramadol -continue physical therapy -continue routine UDS monitoring.  -Discussed Qutenza as an option for neuropathic pain control. Discussed that this is a capsaicin patch, stronger than capsaicin cream. Discussed that it is currently approved for diabetic peripheral neuropathy and post-herpetic neuralgia, but that it has also shown benefit in treating other forms of neuropathy. Provided patient with link to site to learn more about the patch: https://www.clark.biz/. Discussed that the patch would be placed in office and benefits usually last 3 months. Discussed that unintended exposure to capsaicin can cause severe irritation of eyes, mucous membranes, respiratory tract, and skin, but that Qutenza is a local treatment and does not have the systemic side effects of other nerve medications. Discussed that there may be pain, itching, erythema, and decreased sensory function associated with the application of Qutenza. Side effects usually subside within 1 week. A cold pack of analgesic medications can help with these side effects. Blood pressure can also be increased due to pain associated with administration of the patch.  2 patches of Qutenza was applied to the area of pain. Ice packs were applied during the procedure to ensure patient comfort. Blood pressure was monitored  every 15 minutes. The patient tolerated the procedure well. Post-procedure instructions were given and follow-up has been scheduled.   -Discussed current symptoms of pain and history of pain.  -Discussed benefits of exercise in reducing pain. -Discussed following foods that may reduce pain: 1) Ginger (especially studied for arthritis)- reduce leukotriene production to decrease inflammation 2) Blueberries- high in phytonutrients that decrease inflammation 3) Salmon- marine omega-3s reduce joint swelling and pain 4) Pumpkin seeds- reduce inflammation 5) dark chocolate- reduces inflammation 6) turmeric- reduces inflammation 7) tart cherries - reduce pain and stiffness 8) extra virgin olive oil - its compound olecanthal helps to block prostaglandins  9) chili peppers- can be eaten or applied topically via capsaicin 10) mint- helpful for headache, muscle aches, joint pain, and itching 11) garlic- reduces inflammation  Link to further information on diet for chronic pain: http://www.bray.com/   -Good benefit from left thoracic paraspinal trigger point injections in the past  -She had good benefit from radiofrequency nerve ablation with Dr. Wynn Banker of left L5 dorsal ramus and left L3 and L4 medial branches. We educated her that this would not help with her leg pain and now she would like to address this. Has also  had temporary relief from left L5 and S1 ESI and full relief since last Qutenza administration.    -continue Robaxin to 750mg  TID and Tramadol 50mg  TID PRN which are working for her and which she is tolerating without side effects, on an as needed basis.   4) Fibromyalgia with myalgias -Patient's symptoms have greatly improved with her change in job, allowing her to sleep better. Continue daily yoga and meditation for stress relief. Discussed the goal of increasing her aerobic exercise to 15 minutes of daily outdoor  walking for now.  -continue trigger point injections -Recommended continuing NAC (N-Acetyl cysteine) 600mg  BID for her fatigue. Discussed its benefits in boosting glutathione and mitochondrial function. -continue fibromyalgia relief journal -Provided with a pain relief journal and discussed that it contains foods and lifestyle tips to naturally help to improve pain. Discussed that these lifestyle strategies are also very good for health unlike some medications which can have negative side effects. Discussed that the act of keeping a journal can be therapeutic and helpful to realize patterns what helps to trigger and alleviate pain.   -recommended doTerra Deep Blue Essential oil and applied to area of pain today- discussed that this is made of natural plant oils. Shared by personal experience of benefit from use of this essential oil -provided a link to a pdf of Pete Escogue's musculoskeletal alignment exercises -continue trigger point injections every 6 weeks.  Prescribed Zynex heating/cooling blanket    5) Osteopenia: Counseled regarding supplements, diet, and exercise to improve bone density. She is also following with rheumatology and on Fosfomax, as well as with a dietician. She has been following diet plan well. Advised regarding risk of corticosteroids when osteopenic.    6) Hypotension: BP recently 112/82. Advised that she log her BP daily and bring reads to follow-up appointment with me or PCP so medication can be appropriately adjusted.  -Advised regarding healthy foods that can help lower blood pressure and provided with a list: 1) citrus foods- high in vitamins and minerals 2) salmon and other fatty fish - reduces inflammation and oxylipins 3) swiss chard (leafy green)- high level of nitrates 4) pumpkin seeds- one of the best natural sources of magnesium 5) Beans and lentils- high in fiber, magnesium, and potassium 6) Berries- high in flavonoids 7) Amaranth (whole grain, can be cooked  similarly to rice and oats)- high in magnesium and fiber 8) Pistachios- even more effective at reducing BP than other nuts 9) Carrots- high in phenolic compounds that relax blood vessels and reduce inflammation 10) Celery- contain phthalides that relax tissues of arterial walls 11) Tomatoes- can also improve cholesterol and reduce risk of heart disease 12) Broccoli- good source of magnesium, calcium, and potassium 13) Greek yogurt: high in potassium and calcium 14) Herbs and spices: Celery seed, cilantro, saffron, lemongrass, black cumin, ginseng, cinnamon, cardamom, sweet basil, and ginger 15) Chia and flax seeds- also help to lower cholesterol and blood sugar 16) Beets- high levels of nitrates that relax blood vessels  17) spinach and bananas- high in potassium  -Provided lise of supplements that can help with hypertension:  1) magnesium: one high quality brand is Bioptemizers since it contains all 7 types of magnesium, otherwise over the counter magnesium gluconate 400mg  is a good option 2) B vitamins 3) vitamin D 4) potassium 5) CoQ10 6) L-arginine 7) Vitamin C 8) Beetroot -Educated that goal BP is 120/80. -Made goal to incorporate some of the above foods into diet.    7) General health: provided  dietary counseling.   --Follow up in 1 months for continued management of above conditions.    8) Thoracic myofascial pain: good benefit with trigger point injections, repeat in 6 weeks and schedule for every 3 months throughout the year, apply Qutenza patches today Trigger Point Injection Prescribed Zynex Nexwave heating/cooling blanket    9) Overweight BMI 23.46: -Educated that current weight is 141 lbs, commended on her 14 lb weight loss! -continue fish oil -discussed that she recently had to start mirtazepine -Educated regarding health benefits of weight loss- for pain, general health, chronic disease prevention, immune health, mental health.  -Will monitor weight every visit.   -Consider Ro[place]ois tea daily.  -Discussed the benefits of intermittent fasting. -Discussed foods that can assist in weight loss: 1) leafy greens- high in fiber and nutrients 2) dark chocolate- improves metabolism (if prefer sweetened, best to sweeten with honey instead of sugar).  3) cruciferous vegetables- high in fiber and protein 4) full fat yogurt: high in healthy fat, protein, calcium, and probiotics 5) apples- high in a variety of phytochemicals 6) nuts- high in fiber and protein that increase feelings of fullness 7) grapefruit: rich in nutrients, antioxidants, and fiber (not to be taken with anticoagulation) 8) beans- high in protein and fiber 9) salmon- has high quality protein and healthy fats 10) green tea- rich in polyphenols 11) eggs- rich in choline and vitamin D 12) tuna- high protein, boosts metabolism 13) avocado- decreases visceral abdominal fat 14) chicken (pasture raised): high in protein and iron 15) blueberries- reduce abdominal fat and cholesterol 16) whole grains- decreases calories retained during digestion, speeds metabolism 17) chia seeds- curb appetite 18) chilies- increases fat metabolism  -Discussed supplements that can be used:  1) Metatrim 400mg  BID 30 minutes before breakfast and dinner  2) Sphaeranthus indicus and Garcinia mangostana (combinations of these and #1 can be found in capsicum and zychrome  3) green coffee bean extract 400mg  twice per day or [place] (african mango) 150 to 300mg  twice per day.  10) Sinus infections -recommended turmeric and ginger, steam inhalation.   11) Stress: -recommended [place] nuts and saffron -discussed referral to psychology -discussed that her father has a TBI

## 2023-02-26 ENCOUNTER — Ambulatory Visit (INDEPENDENT_AMBULATORY_CARE_PROVIDER_SITE_OTHER): Payer: PRIVATE HEALTH INSURANCE | Admitting: Psychiatry

## 2023-02-26 ENCOUNTER — Encounter: Payer: Self-pay | Admitting: Psychiatry

## 2023-02-26 DIAGNOSIS — F3340 Major depressive disorder, recurrent, in remission, unspecified: Secondary | ICD-10-CM

## 2023-02-26 DIAGNOSIS — F5101 Primary insomnia: Secondary | ICD-10-CM

## 2023-02-26 DIAGNOSIS — F411 Generalized anxiety disorder: Secondary | ICD-10-CM | POA: Diagnosis not present

## 2023-02-26 MED ORDER — SERTRALINE HCL 100 MG PO TABS: ORAL_TABLET | ORAL | 1 refills | Status: AC

## 2023-02-26 MED ORDER — TRAZODONE HCL 100 MG PO TABS
ORAL_TABLET | ORAL | 1 refills | Status: AC
Start: 2023-02-26 — End: ?

## 2023-02-26 MED ORDER — BUSPIRONE HCL 30 MG PO TABS: 30.0000 mg | ORAL_TABLET | Freq: Two times a day (BID) | ORAL | 1 refills | Status: AC

## 2023-02-26 MED ORDER — MIRTAZAPINE 15 MG PO TABS
15.0000 mg | ORAL_TABLET | Freq: Every day | ORAL | 1 refills | Status: AC
Start: 2023-02-26 — End: ?

## 2023-02-26 NOTE — Progress Notes (Signed)
Faith Thomas [id number] Mar 16, 1961 62 y.o.  Subjective:   Patient ID:  Faith Thomas is a 62 y.o. (DOB 1960/08/25) female.  Chief Complaint:  Chief Complaint  Patient presents with   Anxiety   Depression    HPI Faith Thomas presents to the office today for follow-up of anxiety, depression, and insomnia.   She reports that she felt "high and disoriented" with Auvelty and noticed some visual changes. She reports that she stopped it.   She had severe anxiety with stomach upset. She re-started Mirtazapine and noticed it was helpful for her anxiety and stomach upset. She reports that her thinking is more "like me" and "feel more confident in my decisions."  She reports mood is improved with Mirtazapine. She reports increased cravings for sugar and having more appetite. She reports some improvement in concentration with less distraction from worry. Sleeping well. Describes energy and motivation as "up and down." She reports that she does not have the exhaustion that she had in the past. Enjoys time with granddaughter. Denies SI.   She reports that she is now taking Xanax less, about 3 mornings a week. Taking Propranolol 3-4 mornings a week.   Her 42 yo father has a head injury and had multiple falls. She reports that he has periods of confusion. Medical providers have recommended assisted living and father is not able to afford this. He is in an independent living facility with assistance. Father's dog is staying with her since father was having difficulty caring for dog. Has 22 yo granddaughter on Saturdays. Her daughter is doing ok. Daughter is taking care of patient's mother. Sees mother for dinner at least every other week. She reports that she is socializing less in response to having to cancel plans due to family crises and health issues.    Past Medication Trials: Buspar-Effective Trintellix- memory difficulties Sertraline- Effective. Sexual side effects.   Cymbalta- memory difficulties Prozac-stomach pain Paxil Celexa Lexapro Remeron- Effective Wellbutrin XL- Irritability, exhaustion Auvelity- adverse effects Vraylar-Helpful for energy and motivation. Increased cravings for sugar.  Rexulti- increased anxiety Lamictal- ineffective Klonopin Xanax Propranolol Doxepin Trazodone- stopped working [employer] Lyrica- hypomania Gabapentin- sleep disturbance Amitriptyline-sleeplessness, ineffective.  AIMS    Flowsheet Row Office Visit from 12/19/2022 in [facility] Office Visit from 08/21/2022 in [facility] Office Visit from 06/26/2022 in [facility]  AIMS Total Score 0 0 1      PHQ2-9    Flowsheet Row Office Visit from 02/13/2023 in [facility] Office Visit from 08/22/2022 in [facility] Office Visit from 07/03/2022 in [facility] Office Visit from 12/13/2021 in [facility] Office Visit from 11/28/2021 in [facility]  PHQ-2 Total Score 4 2 0 0 0  PHQ-9 Total Score 9 -- -- -- --      Flowsheet Row ED from 07/22/2021 in [facility] Emergency Department at [facility] ED from 01/15/2021 in [facility] Emergency Department at [facility] ED from 10/14/2020 in [facility] Emergency Department at [facility]  C-SSRS RISK CATEGORY No Risk No Risk No Risk        Review of Systems:  Review of Systems  Gastrointestinal:        Improved GI symptoms  Musculoskeletal:  Positive for arthralgias and myalgias. Negative for gait problem.  Improved sciatic pain  Neurological:  Positive for tremors and headaches.  Psychiatric/Behavioral:         Please refer to HPI    Medications: I have reviewed the patient's current medications.  Current  Outpatient Medications  Medication Sig Dispense Refill   acetaminophen (TYLENOL) 500 MG tablet Take 1,000 mg by mouth every 8 (eight) hours as needed.      alendronate (FOSAMAX) 70 MG tablet Take by mouth.     ALPRAZolam (XANAX) 0.5 MG tablet Take 1.5 tabs po QHS and 1/2 tablet twice daily as needed 75 tablet 2   apixaban (ELIQUIS) 5 MG TABS tablet Take 1 tablet (5 mg total) by mouth 2 (two) times daily. 180 tablet 1   b complex vitamins capsule Take 1 capsule by mouth daily.     celecoxib (CELEBREX) 100 MG capsule Take by mouth.     fluticasone (FLONASE) 50 MCG/ACT nasal spray Place into both nostrils daily as needed.     L-Methylfolate 15 MG TABS Take 1 tablet (15 mg total) by mouth daily. 90 tablet 3   lisinopril (ZESTRIL) 10 MG tablet Take 1 tablet by mouth daily.     Melatonin 3 MG CAPS Take by mouth.     methocarbamol (ROBAXIN) 750 MG tablet Take 1 tablet (750 mg total) by mouth every 6 (six) hours as needed for muscle spasms. 90 tablet 3   Multiple Vitamin (MULTIVITAMIN) tablet Take 1 tablet by mouth daily.     Omega-3 Fatty Acids (OMEGA 3 PO) Take by mouth.     Probiotic Product (PROBIOTIC PO) Take 1 capsule by mouth daily.      propranolol (INDERAL) 10 MG tablet Take 1 tablet (10 mg total) by mouth 2 (two) times daily as needed (Anxiety). 60 tablet 5   traMADol (ULTRAM) 50 MG tablet Take 1 tablet (50 mg total) by mouth 3 (three) times daily as needed. 90 tablet 3   vitamin C (ASCORBIC ACID) 500 MG tablet Take by oral route.     Zinc Sulfate (ZINC 15 PO) Take by mouth.     busPIRone (BUSPAR) 30 MG tablet Take 1 tablet (30 mg total) by mouth 2 (two) times daily. 180 tablet 1   cetirizine (ZYRTEC) 10 MG tablet Take 10 mg by mouth daily as needed for allergies. (Patient not taking: Reported on 02/26/2023)     diclofenac Sodium (VOLTAREN ARTHRITIS PAIN) 1 % GEL Apply 2 g topically 4 (four) times daily. (Patient not taking: Reported on 02/26/2023) 50 g 3   estradiol (ESTRACE) 0.1 MG/GM  vaginal cream Place vaginally. (Patient not taking: Reported on 02/26/2023)     Estradiol 10 MCG TABS vaginal tablet Place 1 tablet vaginally 2 (two) times a week. (Patient not taking: Reported on 02/26/2023)     mirtazapine (REMERON) 15 MG tablet Take 1 tablet (15 mg total) by mouth at bedtime. 90 tablet 1   sertraline (ZOLOFT) 100 MG tablet TAKE 2 TABLETS(200 MG) BY MOUTH AT BEDTIME 180 tablet 1   traZODone (DESYREL) 100 MG tablet Take 1/2-1 tablet po QHS prn insomnia 90 tablet 1   Current Facility-Administered Medications  Medication Dose Route Frequency Provider Last Rate Last Admin   sodium chloride (PF) 0.9 % injection 2 mL  2 mL Intravenous Daily Raulkar, Drema Pry, MD   2 mL at 06/01/22 0943    Medication Side Effects: Increased appetite and cravings for sweets  Allergies:  Allergies  Allergen Reactions   Citalopram Tinitus    Other reaction(s): Unknown  Escitalopram Tinitus    Other reaction(s): Unknown   Latex Swelling    Other reaction(s): Unknown Other reaction(s): other   Penicillins Swelling, Rash and Other (See Comments)    Has patient had a PCN reaction causing immediate rash, facial/tongue/throat swelling, SOB or lightheadedness with hypotension: Yes Has patient had a PCN reaction causing severe rash involving mucus membranes or skin necrosis: No Has patient had a PCN reaction that required hospitalization: No Has patient had a PCN reaction occurring within the last 10 years: No If all of the above answers are "NO", then may proceed with Cephalosporin use.   Bee Venom Swelling   Duloxetine Hcl     -memory loss Other reaction(s): Unknown   Escitalopram Oxalate Tinitus   Other     Yeast creams- causes secondary infections, itching, burning, redness    Penicillin G     Other reaction(s): Unknown   Progesterone     Pt feels caused weakness, fatigue, cognitive deficits Other reaction(s): Unknown   Vortioxetine     Memory problems Other reaction(s): Unknown    Bupropion Other (See Comments)    weakness and malaise. Other reaction(s): Unknown   Duloxetine Other (See Comments)   Gabapentin Other (See Comments)    Could not sleep.      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   Please see review of systems for further details on the patient's review from today.   Objective:   Physical Exam:  There were no vitals taken for this visit.  Physical Exam Constitutional:      General: She is not in acute distress. Musculoskeletal:        General: No deformity.  Neurological:     Mental Status: She is alert and oriented to person, place, and time.     Coordination: Coordination normal.  Psychiatric:        Attention and Perception: Attention and perception normal. She does not perceive auditory or visual hallucinations.        Mood and Affect: Affect is not labile, blunt, angry or inappropriate.        Speech: Speech normal.        Behavior: Behavior normal.        Thought Content: Thought content normal. Thought content is not paranoid or delusional. Thought content does not include homicidal or suicidal ideation. Thought content does not include homicidal or suicidal plan.        Cognition and Memory: Cognition and memory normal.        Judgment: Judgment normal.     Comments: Insight intact Mood is less depressed and less anxious compared to last visit     Lab Review:     Component Value Date/Time   NA 132 (L) 07/22/2021 1351   NA 134 (L)  01/05/2017 1359   K 3.0 (L) 07/22/2021 1351   K 3.9 01/05/2017 1359  CL 97 (L) 07/22/2021 1351   CO2 25 07/22/2021 1351   CO2 25 01/05/2017 1359   GLUCOSE 101 (H) 07/22/2021 1351   GLUCOSE 106 01/05/2017 1359   BUN 6 07/22/2021 1351   BUN 10.2 01/05/2017 1359   CREATININE 0.67 07/22/2021 1351   CREATININE 0.8 01/05/2017 1359   CALCIUM 9.2 07/22/2021 1351   CALCIUM 9.5 01/05/2017 1359   PROT 7.3 01/15/2021 1238   PROT 6.4 01/05/2017 1359   ALBUMIN 4.6 01/15/2021 1238   ALBUMIN 4.2 01/05/2017 1359   AST 25 01/15/2021 1238   AST 18 01/05/2017 1359   ALT 24 01/15/2021 1238   ALT 15 01/05/2017 1359   ALKPHOS 46 01/15/2021 1238   ALKPHOS 60 01/05/2017 1359   BILITOT 0.8 01/15/2021 1238   BILITOT 0.22 01/05/2017 1359   GFRNONAA >60 07/22/2021 1351   GFRAA >60 12/04/2016 1548       Component Value Date/Time   WBC 9.2 07/22/2021 1351   RBC 3.88 07/22/2021 1351   HGB 12.8 07/22/2021 1351   HGB 13.4 01/05/2017 1359   HCT 37.0 07/22/2021 1351   HCT 39.0 01/05/2017 1359   PLT 387 07/22/2021 1351   PLT 274 01/05/2017 1359   MCV 95.4 07/22/2021 1351   MCV 97.5 01/05/2017 1359   MCH 33.0 07/22/2021 1351   MCHC 34.6 07/22/2021 1351   RDW 12.4 07/22/2021 1351   RDW 12.4 01/05/2017 1359   LYMPHSABS 2.4 07/22/2021 1351   LYMPHSABS 3.0 01/05/2017 1359   MONOABS 1.0 07/22/2021 1351   MONOABS 0.6 01/05/2017 1359   EOSABS 0.1 07/22/2021 1351   EOSABS 0.1 01/05/2017 1359   BASOSABS 0.0 07/22/2021 1351   BASOSABS 0.0 01/05/2017 1359    No results found for: "POCLITH", "LITHIUM"   No results found for: "PHENYTOIN", "PHENOBARB", "VALPROATE", "CBMZ"   .res Assessment: Plan:    45 minutes spent dedicated to the care of this patient on the date of this encounter to include pre-visit review of records, ordering of medication, post visit documentation, and face-to-face time with the patient discussing adverse effects with Auvelity, response to her re-starting Mirtazapine, and the  effect recent stressors have had on her mood and anxiety. She reports that at this time benefits or Mirtazapine are outweighing side effect (increased appetite/possible weight gain) since her mood, anxiety, sleep, and GI symptoms have improved since re-starting Mirtazapine. She reports that she would like to continue Mirtazapine at this time, particularly in light of caregiving stress with her father. Discussed that she may be able to reduce or discontinue Trazodone with taking Mirtazapine. She reports that she would like to continue Trazodone 100 mg nightly for now to have a longer period of stability and may consider attempting to reduce Trazodone to 50 mg at bedtime after a month.  Discussed that first degree relative is doing well on Effexor after multiple med trials in the past. Pt reports that she has never taken Effexor or Pristiq. She reports that she did well on Cymbalta for a few years and then started to have memory difficulties. Discussed considering trial of Pristiq in the future.  Will send in script for Mirtazapine  Continue Buspar 30 mg BID for anxiety.  Continue Sertraline 200 mg daily for anxiety and depression.  Continue Propranolol prn anxiety.  Continue Alprazolam prn for anxiety and insomnia.  Continue L-methylfolate 15 mg daily for depression and MTHFR mutation.  Pt to follow-up in 4 months or sooner if clinically indicated.  Recommend continuing psychotherapy.  Patient advised to  contact office with any questions, adverse effects, or acute worsening in signs and symptoms.   Faith Thomas was seen today for anxiety and depression.  Diagnoses and all orders for this visit:  Generalized anxiety disorder -     busPIRone (BUSPAR) 30 MG tablet; Take 1 tablet (30 mg total) by mouth 2 (two) times daily. -     sertraline (ZOLOFT) 100 MG tablet; TAKE 2 TABLETS(200 MG) BY MOUTH AT BEDTIME  MDD (recurrent major depressive disorder) -     sertraline (ZOLOFT) 100 MG tablet; TAKE 2 TABLETS(200  MG) BY MOUTH AT BEDTIME  Primary insomnia -     traZODone (DESYREL) 100 MG tablet; Take 1/2-1 tablet po QHS prn insomnia  Other orders -     mirtazapine (REMERON) 15 MG tablet; Take 1 tablet (15 mg total) by mouth at bedtime.     Please see After Visit Summary for patient specific instructions.  Future Appointments  Date Time Provider Department Center  06/26/2023  8:30 AM Corie Chiquito, PMHNP [facility] None  08/13/2023  9:20 AM Raulkar, Drema Pry, MD [facility]    No orders of the defined types were placed in this encounter.   -------------------------------

## 2023-03-05 ENCOUNTER — Telehealth: Payer: Self-pay | Admitting: Specialist

## 2023-03-05 ENCOUNTER — Other Ambulatory Visit: Payer: Self-pay | Admitting: Physical Medicine and Rehabilitation

## 2023-03-05 DIAGNOSIS — M7918 Myalgia, other site: Secondary | ICD-10-CM

## 2023-03-05 DIAGNOSIS — M4807 Spinal stenosis, lumbosacral region: Secondary | ICD-10-CM

## 2023-03-05 MED ORDER — TRAMADOL HCL 50 MG PO TABS: 50.0000 mg | ORAL_TABLET | Freq: Three times a day (TID) | ORAL | 3 refills | Status: DC | PRN

## 2023-03-05 NOTE — Telephone Encounter (Signed)
Patient is a patient of Dr. Carlis Abbott Her tramadol script is out of refills.  She would like a refill called in to her pharmacy, which is [facility] on [address].  She is going out of town on Wednesday and hopes that she can  get the refill prior to leaving town on Wednesday.

## 2023-03-06 ENCOUNTER — Ambulatory Visit: Payer: PRIVATE HEALTH INSURANCE | Admitting: Psychiatry

## 2023-04-18 ENCOUNTER — Encounter: Payer: Self-pay | Admitting: Psychiatry

## 2023-04-28 ENCOUNTER — Other Ambulatory Visit: Payer: Self-pay | Admitting: Physical Medicine and Rehabilitation

## 2023-04-28 DIAGNOSIS — M4807 Spinal stenosis, lumbosacral region: Secondary | ICD-10-CM

## 2023-04-28 DIAGNOSIS — M7918 Myalgia, other site: Secondary | ICD-10-CM

## 2023-05-15 ENCOUNTER — Telehealth: Payer: Self-pay | Admitting: Psychiatry

## 2023-05-15 NOTE — Telephone Encounter (Signed)
Spoke with patient concerning establishing care with another provider at the practice. Faith Thomas would like to continue with Faith Thomas. So will contact Faith Thomas after January 1st as advised. Patient request that medication be pended for refills , too early to fill at this time. The appointment for med check was 06/25/22.

## 2023-05-15 NOTE — Telephone Encounter (Signed)
Patient has RF available on all meds except for maybe  propranolol. LVM to RC to verify.

## 2023-05-17 NOTE — Telephone Encounter (Signed)
Sent MyChart message asking if she needed a RF on propranolol.

## 2023-06-26 ENCOUNTER — Ambulatory Visit: Payer: PRIVATE HEALTH INSURANCE | Admitting: Psychiatry

## 2023-08-13 ENCOUNTER — Encounter: Payer: Self-pay | Admitting: Physical Medicine and Rehabilitation

## 2023-08-13 ENCOUNTER — Encounter
Payer: PRIVATE HEALTH INSURANCE | Attending: Physical Medicine and Rehabilitation | Admitting: Physical Medicine and Rehabilitation

## 2023-08-13 VITALS — BP 111/78 | HR 85 | Ht 65.0 in | Wt 157.0 lb

## 2023-08-13 DIAGNOSIS — M7918 Myalgia, other site: Secondary | ICD-10-CM | POA: Diagnosis present

## 2023-08-13 MED ORDER — METHOCARBAMOL 750 MG PO TABS
750.0000 mg | ORAL_TABLET | Freq: Four times a day (QID) | ORAL | 3 refills | Status: DC | PRN
Start: 2023-08-13 — End: 2024-02-12

## 2023-08-13 MED ORDER — TRAMADOL HCL 50 MG PO TABS
50.0000 mg | ORAL_TABLET | Freq: Three times a day (TID) | ORAL | 3 refills | Status: DC | PRN
Start: 2023-08-13 — End: 2024-02-12

## 2023-08-13 NOTE — Progress Notes (Signed)
 Subjective:    Patient ID: Faith Thomas, female    DOB: 24-Aug-1960, 63 y.o.   MRN: [id number]   Faith Thomas is a 63 year old woman who presents for f/u of her fibromyalgia, facet arthropathy, myofascial pain, and lumbar spinal stenosis.   1) Low back pain: She received her radiofrequency nerve ablation from Dr. Wynn Banker on 7/22 of her left L5 dorsal ramus and left L3 and L4 medial branches. She had good benefit from this. We had discussed that this would help her back pain but not her leg pain. We tried Qutenza for her back pain and leg pain previously with good benefit. She would like to repeat today. Areas of pain today are left buttock and left side of thoracic and lumbar spine.   -does not ned the Qutenza  -her pain is much better -her insurance changed and everything is much more expensive -she is still doing PT, yoga -she recently did cupping, it seems to help more when her therapist does this for her than when she does this herself -she has been caring for her granddaughter recently since her daughter needs help with childcare -pain has worsened a little from prior but is overall much better than it was in the past, she notes that she is not sure whether it is the trigger point injections, the Qutneza patches, the physical therapy, or the yoga that is most helping her, or a combination of these.  -she felt sick doing the Whole 30 but she felt really sick- she couldn't walk up the stairs- she quit this and felt better and added in electrolytes. She added back in dairy (yogurt, cheese, not whole milk), increased the olive oil and salt. She visited on a friend on vacation. She visited a friend from [place] and they did cooking lessons together. She uses a lot more olive oil.  -her weight today is 141, down 7 lbs from last viist! -pain is well controlled, she is ready to try Qutenza patches today Her sodium is always low normal and it tanked.  -she is happy with current system of  alternating trigger point injections and Qutenza treatments -she continues to be able to tolerate work and her daily yoga.  Pain is radiating into left leg, but to a lesser extend than it did before Last time we had done a 40 minute Qutenza appointment, discussed trying 30 minute appointment as this seems to be long enough to provide relief. Pain is overall better between her exercise, physical therapy, Qutenza, and trigger point injections.   She is on Eliquis (prescribed by her PCP, she does not see a cardiologist), which she stopped three days prior to prior ESI. She knows to have driver on the day of procedures.   She also has a painful thoracic paraspinal spasm on left side. She has greatly benefited from trigger points for this. This radiates down to her lumbar spine and she is not sure if it is connected to her lower back pain.   She has a There Cane and uses a soft foam roller and rolls on that regularly. She has a left sided thoracic parasponal muscle spasm. Ice helps decrease the pain.  She would like to be able to have less pain in order to do more activities with her 61 year old granddaughter.    She is doing Noom, a weight loss app based on psychology. Being aware of emoitional eating, lost a little bit of weight. Her goal is to get down  to 135. Height is 5'5."   She has been using ginger a fair amount. It caused a lot of gastric issues.   She has had very good benefit from trigger point injections.   She has been doing meditation, using the 10% Happier app, with good benefits. At times she does have a lot of fatigue.   She has lost 5 lbs in the past month!  Average pain is 4/5, pain right now is 5/10.  Has been having a sinus infection for 4-6 weeks She was having yellow mucus drainage Stress has dramatically increased She had to move her dad back from [place] right before Thanksgiving He has a history of TBI and has a hard time distinguishing between needs and wants.    2) Myofasical pain -she is doing dry needling with her therapist -walking aggravates the pain -her goal is to be able to do multiple laps -this is her currently worst pain -she still has sciatic pain as well.   3) Left adductor inflammation: -PT told her that was the most inflamed adductor she has ever seen  3) Right shoulder pain  4) Stress -high levels of stress -working with a professional  5) Left buttock:  -she notices that pigeon pose aggravates this -using tramadol 2-3 times per day   Pain Inventory Average Pain 4 Pain Right Now 5 My pain is intermittent, dull, and aching  In the last 24 hours, has pain interfered with the following? General activity 4 Relation with others 3 Enjoyment of life 4 What TIME of day is your pain at its worst? varies Sleep (in general) Fair  Pain is worse with: walking, bending, sitting, inactivity, standing, and some activites Pain improves with: rest, heat/ice, therapy/exercise, and medication Relief from Meds: 8  Family History  Problem Relation Age of Onset   Arthritis Mother    Hypertension Mother    Mental illness Mother    Anxiety disorder Mother    Depression Mother    Arthritis Father    Hypertension Father    Mental illness Father    Anxiety disorder Father    Depression Father    Mental illness Brother    ADD / ADHD Brother    Psychosis Brother    Post-traumatic stress disorder Brother    Suicidality Brother    Cancer Maternal Aunt    Autism Maternal Aunt    Depression Maternal Aunt    Arthritis Maternal Grandfather    Hypertension Maternal Grandmother    Depression Cousin    Anxiety disorder Cousin    Arthritis Daughter    Autism Daughter    Social History   Socioeconomic History   Marital status: Divorced    Spouse name: Not on file   Number of children: Not on file   Years of education: Not on file   Highest education level: Not on file  Occupational History   Not on file  Tobacco Use    Smoking status: Never   Smokeless tobacco: Never  Vaping Use   Vaping status: Never Used  Substance and Sexual Activity   Alcohol use: Yes    Alcohol/week: 0.0 standard drinks of alcohol    Comment: occasional    Drug use: No   Sexual activity: Not on file  Other Topics Concern   Not on file  Social History Narrative   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 : No  Physical Activity: Not on file  Stress: Not on file  Social Connections: Not on file   History reviewed. No pertinent surgical history. History reviewed. No pertinent surgical history. 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   Hypertension    Osteoporosis    PE (pulmonary thromboembolism) (HCC)    Positive TB test    Raynaud disease    Scoliosis    Sleep apnea    Urine incontinence    BP 111/78   Pulse 85   Ht 5\' 5"  (1.651 m)   Wt 157 lb (71.2 kg)   SpO2 96%   BMI 26.13 kg/m   Opioid Risk Score:   Fall Risk Score:  `1  Depression screen [facility] 2/9     02/13/2023    9:01 AM 08/22/2022    9:12 AM 07/03/2022   11:20 AM 12/13/2021    9:16 AM 11/28/2021    3:17 PM 09/26/2021   10:04 AM 04/07/2021   10:33 AM  Depression  screen PHQ 2/9  Decreased Interest 2 1 0 0 0 0 3  Down, Depressed, Hopeless 2 1 0 0 0 0 3  PHQ - 2 Score 4 2 0 0 0 0 6  Altered sleeping 1        Tired, decreased energy 2        Change in appetite 1        Feeling bad or failure about yourself  0        Trouble concentrating 1        Moving slowly or fidgety/restless 0        Suicidal thoughts 0        PHQ-9 Score 9          Review of Systems  Constitutional: Negative.   HENT: Negative.    Eyes: Negative.   Respiratory: Negative.    Cardiovascular: Negative.   Gastrointestinal: Negative.   Endocrine: Negative.   Genitourinary: Negative.   Musculoskeletal:  Positive for back pain.       Left leg pain, left buttock pain  Skin: Negative.   Allergic/Immunologic: Negative.   Neurological: Negative.   Hematological: Negative.   Psychiatric/Behavioral: Negative.    All other systems reviewed and are negative.      Objective:   Physical Exam Gen: no distress, normal appearing, weight 150 lbs HEENT: oral mucosa pink and moist, NCAT Cardio: Reg rate Chest: normal effort, normal rate of breathing Abd: soft, non-distended Ext: no edema Skin: intact, improved musculature, no evidence of open lesions or sunburn. Neuro: Alert and oriented x3 Musculoskeletal: No longer with pain radiating down leg in L5 and S1 nerve root distribution! Full and painless flexion and  extension. Left sided thoracic paraspinal and gluteal trigger points. Tight cervical myofascia Psych: pleasant, normal affect    Assessment & Plan:  1) Lumbar spondylosis with neurogenic claudication, left side 2) Bilateral facet joint arthritis, lumbar 3) left sided thoracic paraspinal spasm  -robaxin refilled.  -discussed that pain has greatly improved -Faith Thomas's chief complaint today is left sided thoracic and gluteal pain. Her left sided sciatica in the L5 and S1 distribution resolved with prior Qutenza treatment! On physical exam, she has painless FROM on  flexion with her bilateral low back pain. On last visit this was reproduced with extension but currently she has full and painless extension. Her pain has been most consistent with left L5 and S1 nerve root impingement secondary to stenosis and lower back pain secondary to facet arthropathy, which is seen on her most recent MRI, which shows worsening facet arthropathy as well as left sided annular fissure; results reviewed and discussed with patient previously. She would benefit and is agreeable to a course of physical therapy focused on core strengthening. She would like to hold off on starting this until COVID-19 cases have reduced. For now, she will continue her daily yoga practice (she performed 2 hours today and does so regularly), which has been shown to help low back pain and which helps her.  -continue tramadol -continue physical therapy -continue routine UDS monitoring.  -Discussed Qutenza as an option for neuropathic pain control. Discussed that this is a capsaicin patch, stronger than capsaicin cream. Discussed that it is currently approved for diabetic peripheral neuropathy and post-herpetic neuralgia, but that it has also shown benefit in treating other forms of neuropathy. Provided patient with link to site to learn more about the patch: https://www.clark.biz/. Discussed that the patch would be placed in office and benefits usually last 3 months. Discussed that unintended exposure to capsaicin can cause severe irritation of eyes, mucous membranes, respiratory tract, and skin, but that Qutenza is a local treatment and does not have the systemic side effects of other nerve medications. Discussed that there may be pain, itching, erythema, and decreased sensory function associated with the application of Qutenza. Side effects usually subside within 1 week. A cold pack of analgesic medications can help with these side effects. Blood pressure can also be increased due to pain associated with  administration of the patch.  2 patches of Qutenza was applied to the area of pain. Ice packs were applied during the procedure to ensure patient comfort. Blood pressure was monitored every 15 minutes. The patient tolerated the procedure well. Post-procedure instructions were given and follow-up has been scheduled.   -Discussed current symptoms of pain and history of pain.  -Discussed benefits of exercise in reducing pain. -Discussed following foods that may reduce pain: 1) Ginger (especially studied for arthritis)- reduce leukotriene production to decrease inflammation 2) Blueberries- high in phytonutrients that decrease inflammation 3) Salmon- marine omega-3s reduce joint swelling and pain 4) Pumpkin seeds- reduce inflammation 5) dark chocolate- reduces inflammation 6) turmeric- reduces inflammation 7) tart cherries - reduce pain and stiffness 8) extra virgin olive oil - its compound olecanthal helps to block prostaglandins  9) chili peppers- can be eaten or applied topically via capsaicin 10) mint- helpful for headache, muscle aches, joint pain, and itching 11) garlic- reduces inflammation  Link to further information on diet for chronic pain: [email or web address]/   -Good benefit from left thoracic paraspinal trigger point injections in the past  -She had good benefit from radiofrequency nerve ablation with Dr. Wynn Banker of  left L5 dorsal ramus and left L3 and L4 medial branches. We educated her that this would not help with her leg pain and now she would like to address this. Has also had temporary relief from left L5 and S1 ESI and full relief since last Qutenza administration.    -continue Robaxin to 750mg  TID and Tramadol 50mg  TID PRN which are working for her and which she is tolerating without side effects, on an as needed basis.   4) Fibromyalgia with myalgias, worst in left buttock currently -refilled tramadol  and robaxin -continue yoga -discussed benefits of red light therapy -Patient's symptoms have greatly improved with her change in job, allowing her to sleep better. Continue daily yoga and meditation for stress relief. Discussed the goal of increasing her aerobic exercise to 15 minutes of daily outdoor walking for now.  -continue dry needling -Recommended continuing NAC (N-Acetyl cysteine) 600mg  BID for her fatigue. Discussed its benefits in boosting glutathione and mitochondrial function. -continue fibromyalgia relief journal -Provided with a pain relief journal and discussed that it contains foods and lifestyle tips to naturally help to improve pain. Discussed that these lifestyle strategies are also very good for health unlike some medications which can have negative side effects. Discussed that the act of keeping a journal can be therapeutic and helpful to realize patterns what helps to trigger and alleviate pain.   -recommended doTerra Deep Blue Essential oil and applied to area of pain today- discussed that this is made of natural plant oils. Shared by personal experience of benefit from use of this essential oil -provided a link to a pdf of Pete Escogue's musculoskeletal alignment exercises -continue trigger point injections every 6 weeks.  Prescribed Zynex heating/cooling blanket  Discussed current symptoms of pain and history of pain.  -Discussed benefits of exercise in reducing pain. -Discussed following foods that may reduce pain: 1) Ginger (especially studied for arthritis)- reduce leukotriene production to decrease inflammation 2) Blueberries- high in phytonutrients that decrease inflammation 3) Salmon- marine omega-3s reduce joint swelling and pain 4) Pumpkin seeds- reduce inflammation 5) dark chocolate- reduces inflammation 6) turmeric- reduces inflammation 7) tart cherries - reduce pain and stiffness 8) extra virgin olive oil - its compound olecanthal helps to block  prostaglandins  9) chili peppers- can be eaten or applied topically via capsaicin 10) mint- helpful for headache, muscle aches, joint pain, and itching 11) garlic- reduces inflammation  Link to further information on diet for chronic pain: [email or web address]    5) Osteopenia: Counseled regarding supplements, diet, and exercise to improve bone density. She is also following with rheumatology and on Fosfomax, as well as with a dietician. She has been following diet plan well. Advised regarding risk of corticosteroids when osteopenic.    6) Hypotension: BP recently 112/82. Advised that she log her BP daily and bring reads to follow-up appointment with me or PCP so medication can be appropriately adjusted.  -Advised regarding healthy foods that can help lower blood pressure and provided with a list: 1) citrus foods- high in vitamins and minerals 2) salmon and other fatty fish - reduces inflammation and oxylipins 3) swiss chard (leafy green)- high level of nitrates 4) pumpkin seeds- one of the best natural sources of magnesium 5) Beans and lentils- high in fiber, magnesium, and potassium 6) Berries- high in flavonoids 7) Amaranth (whole grain, can be cooked similarly to rice and oats)- high in magnesium and fiber 8) Pistachios- even more effective at reducing BP than other nuts 9) Carrots- high in  phenolic compounds that relax blood vessels and reduce inflammation 10) Celery- contain phthalides that relax tissues of arterial walls 11) Tomatoes- can also improve cholesterol and reduce risk of heart disease 12) Broccoli- good source of magnesium, calcium, and potassium 13) Greek yogurt: high in potassium and calcium 14) Herbs and spices: Celery seed, cilantro, saffron, lemongrass, black cumin, ginseng, cinnamon, cardamom, sweet basil, and ginger 15) Chia and flax seeds- also help to lower cholesterol and blood sugar 16)  Beets- high levels of nitrates that relax blood vessels  17) spinach and bananas- high in potassium  -Provided lise of supplements that can help with hypertension:  1) magnesium: one high quality brand is Bioptemizers since it contains all 7 types of magnesium, otherwise over the counter magnesium gluconate 400mg  is a good option 2) B vitamins 3) vitamin D 4) potassium 5) CoQ10 6) L-arginine 7) Vitamin C 8) Beetroot -Educated that goal BP is 120/80. -Made goal to incorporate some of the above foods into diet.    7) General health: provided dietary counseling.   --Follow up in 1 months for continued management of above conditions.    8) Thoracic myofascial pain:  -continue yoga -continue dry needling with therapy Prescribed Zynex Nexwave heating/cooling blanket    9) Overweight BMI 23.46: -Educated that current weight is 141 lbs, commended on her 14 lb weight loss! -continue fish oil -discussed that she recently had to start mirtazepine -Educated regarding health benefits of weight loss- for pain, general health, chronic disease prevention, immune health, mental health.  -Will monitor weight every visit.  -Consider Ro[place]ois tea daily.  -Discussed the benefits of intermittent fasting. -Discussed foods that can assist in weight loss: 1) leafy greens- high in fiber and nutrients 2) dark chocolate- improves metabolism (if prefer sweetened, best to sweeten with honey instead of sugar).  3) cruciferous vegetables- high in fiber and protein 4) full fat yogurt: high in healthy fat, protein, calcium, and probiotics 5) apples- high in a variety of phytochemicals 6) nuts- high in fiber and protein that increase feelings of fullness 7) grapefruit: rich in nutrients, antioxidants, and fiber (not to be taken with anticoagulation) 8) beans- high in protein and fiber 9) salmon- has high quality protein and healthy fats 10) green tea- rich in polyphenols 11) eggs- rich in choline and vitamin  D 12) tuna- high protein, boosts metabolism 13) avocado- decreases visceral abdominal fat 14) chicken (pasture raised): high in protein and iron 15) blueberries- reduce abdominal fat and cholesterol 16) whole grains- decreases calories retained during digestion, speeds metabolism 17) chia seeds- curb appetite 18) chilies- increases fat metabolism  -Discussed supplements that can be used:  1) Metatrim 400mg  BID 30 minutes before breakfast and dinner  2) Sphaeranthus indicus and Garcinia mangostana (combinations of these and #1 can be found in capsicum and zychrome  3) green coffee bean extract 400mg  twice per day or [place] (african mango) 150 to 300mg  twice per day.  10) Sinus infections -recommended turmeric and ginger, steam inhalation.   11) Stress: -recommended [place] nuts and saffron -discussed referral to psychology -discussed that her father has a TBI

## 2024-02-11 NOTE — Progress Notes (Unsigned)
 Subjective:    Patient ID: Faith Thomas, female    DOB: 1960-09-06, 63 y.o.   MRN: [id number]   Faith Thomas is a 63 year old woman who presents for f/u of her fibromyalgia, facet arthropathy, myofascial pain, and lumbar spinal stenosis.   1) Low back pain: She received her radiofrequency nerve ablation from Dr. Carilyn on 7/22 of her left L5 dorsal ramus and left L3 and L4 medial branches. She had good benefit from this. We had discussed that this would help her back pain but not her leg pain. We tried Qutenza  for her back pain and leg pain previously with good benefit. She would like to repeat today. Areas of pain today are left buttock and left side of thoracic and lumbar spine.   -does not ned the Qutenza   -her pain is much better -her insurance changed and everything is much more expensive -she is still doing PT, yoga -she recently did cupping, it seems to help more when her therapist does this for her than when she does this herself -she has been caring for her granddaughter recently since her daughter needs help with childcare -pain has worsened a little from prior but is overall much better than it was in the past, she notes that she is not sure whether it is the trigger point injections, the Qutneza patches, the physical therapy, or the yoga that is most helping her, or a combination of these.  -she felt sick doing the Whole 30 but she felt really sick- she couldn't walk up the stairs- she quit this and felt better and added in electrolytes. She added back in dairy (yogurt, cheese, not whole milk), increased the olive oil and salt. She visited on a friend on vacation. She visited a friend from [place] and they did cooking lessons together. She uses a lot more olive oil.  -her weight today is 141, down 7 lbs from last viist! -pain is well controlled, she is ready to try Qutenza  patches today Her sodium is always low normal and it tanked.  -she is happy with current system of  alternating trigger point injections and Qutenza  treatments -she continues to be able to tolerate work and her daily yoga.  Pain is radiating into left leg, but to a lesser extend than it did before Last time we had done a 40 minute Qutenza  appointment, discussed trying 30 minute appointment as this seems to be long enough to provide relief. Pain is overall better between her exercise, physical therapy, Qutenza , and trigger point injections.   She is on Eliquis  (prescribed by her PCP, she does not see a cardiologist), which she stopped three days prior to prior ESI. She knows to have driver on the day of procedures.   She also has a painful thoracic paraspinal spasm on left side. She has greatly benefited from trigger points for this. This radiates down to her lumbar spine and she is not sure if it is connected to her lower back pain.   She has a There Cane and uses a soft foam roller and rolls on that regularly. She has a left sided thoracic parasponal muscle spasm. Ice helps decrease the pain.  She would like to be able to have less pain in order to do more activities with her 10 year old granddaughter.    She is doing Noom, a weight loss app based on psychology. Being aware of emoitional eating, lost a little bit of weight. Her goal is to get down  to 135. Height is 5'5.   She has been using ginger a fair amount. It caused a lot of gastric issues.   She has had very good benefit from trigger point injections.   She has been doing meditation, using the 10% Happier app, with good benefits. At times she does have a lot of fatigue.   She has lost 5 lbs in the past month!  Average pain is 4/5, pain right now is 5/10.  Has been having a sinus infection for 4-6 weeks She was having yellow mucus drainage Stress has dramatically increased She had to move her dad back from [place]  right before Thanksgiving He has a history of TBI and has a hard time distinguishing between needs and wants.    2) Myofasical pain -she is doing dry needling with her therapist -walking aggravates the pain -her goal is to be able to do multiple laps -this is her currently worst pain -she still has sciatic pain as well.   3) Left adductor inflammation: -PT told her that was the most inflamed adductor she has ever seen  3) Right shoulder pain  4) Stress -high levels of stress -working with a professional  5) Left buttock:  -she notices that pigeon pose aggravates this -using tramadol  2-3 times per day   Pain Inventory Average Pain 4 Pain Right Now 5 My pain is intermittent, dull, and aching  In the last 24 hours, has pain interfered with the following? General activity 4 Relation with others 3 Enjoyment of life 4 What TIME of day is your pain at its worst? varies Sleep (in general) Fair  Pain is worse with: walking, bending, sitting, inactivity, standing, and some activites Pain improves with: rest, heat/ice, therapy/exercise, and medication Relief from Meds: 8  Family History  Problem Relation Age of Onset   Arthritis Mother    Hypertension Mother    Mental illness Mother    Anxiety disorder Mother    Depression Mother    Arthritis Father    Hypertension Father    Mental illness Father    Anxiety disorder Father    Depression Father    Mental illness Brother    ADD / ADHD Brother    Psychosis Brother    Post-traumatic stress disorder Brother    Suicidality Brother    Cancer Maternal Aunt    Autism Maternal Aunt    Depression Maternal Aunt    Arthritis Maternal Grandfather    Hypertension Maternal Grandmother    Depression Cousin    Anxiety disorder Cousin    Arthritis Daughter    Autism Daughter    Social History   Socioeconomic History   Marital status: Divorced    Spouse name: Not on file   Number of children: Not on file   Years of education: Not on file   Highest education level: Not on file  Occupational History   Not on file  Tobacco Use    Smoking status: Never   Smokeless tobacco: Never  Vaping Use   Vaping status: Never Used  Substance and Sexual Activity   Alcohol use: Yes    Alcohol/week: 0.0 standard drinks of alcohol    Comment: occasional    Drug use: No   Sexual activity: Not on file  Other Topics Concern   Not on file  Social History Narrative   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 : Never true  Transportation Needs: No Transportation Needs (08/20/2023)   Received from [facility]    In the past 12 months, has lack of reliable transportation kept you from medical appointments, meetings, work or from getting things needed for daily living? : No  Physical Activity: Not on file  Stress: Not on file  Social Connections: Not on file   No past surgical history on file. No past surgical history on file. 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   Hypertension    Osteoporosis    PE (pulmonary thromboembolism) (HCC)    Positive TB test    Raynaud disease    Scoliosis    Sleep apnea    Urine incontinence    There were no vitals taken for this visit.  Opioid Risk Score:   Fall Risk Score:  `1  Depression screen [facility] 2/9     08/13/2023    9:40 AM 02/13/2023    9:01 AM 08/22/2022    9:12 AM 07/03/2022   11:20 AM 12/13/2021    9:16 AM 11/28/2021    3:17 PM  09/26/2021   10:04 AM  Depression screen PHQ 2/9  Decreased Interest 1 2 1  0 0 0 0  Down, Depressed, Hopeless 1 2 1  0 0 0 0  PHQ - 2 Score 2 4 2  0 0 0 0  Altered sleeping  1       Tired, decreased energy  2       Change in appetite  1       Feeling bad or failure about yourself   0       Trouble concentrating  1       Moving slowly or fidgety/restless  0       Suicidal thoughts  0       PHQ-9 Score  9         Review of Systems  Constitutional: Negative.   HENT: Negative.    Eyes: Negative.   Respiratory: Negative.    Cardiovascular: Negative.   Gastrointestinal: Negative.   Endocrine: Negative.   Genitourinary: Negative.   Musculoskeletal:  Positive for back pain.       Left leg pain, left buttock pain  Skin: Negative.   Allergic/Immunologic: Negative.   Neurological: Negative.   Hematological: Negative.   Psychiatric/Behavioral: Negative.    All other systems reviewed and are negative.      Objective:   Physical Exam Gen: no distress, normal appearing, weight 150 lbs HEENT: oral mucosa pink and moist, NCAT Cardio: Reg rate Chest: normal effort, normal rate of breathing Abd: soft, non-distended Ext: no edema Skin: intact, improved musculature, no evidence of open lesions or sunburn. Neuro: Alert and oriented x3 Musculoskeletal: No longer with pain radiating down leg in L5 and S1 nerve root distribution! Full and painless  flexion and extension. Left sided thoracic paraspinal and gluteal trigger points. Tight cervical myofascia Psych: pleasant, normal affect    Assessment & Plan:  1) Lumbar spondylosis with neurogenic claudication, left side 2) Bilateral facet joint arthritis, lumbar 3) left sided thoracic paraspinal spasm  -robaxin  refilled.  -discussed that pain has greatly improved -Faith Thomas's chief complaint today is left sided thoracic and gluteal pain. Her left sided sciatica in the L5 and S1 distribution resolved with prior Qutenza  treatment! On  physical exam, she has painless FROM on flexion with her bilateral low back pain. On last visit this was reproduced with extension but currently she has full and painless extension. Her pain has been most consistent with left L5 and S1 nerve root impingement secondary to stenosis and lower back pain secondary to facet arthropathy, which is seen on her most recent MRI, which shows worsening facet arthropathy as well as left sided annular fissure; results reviewed and discussed with patient previously. She would benefit and is agreeable to a course of physical therapy focused on core strengthening. She would like to hold off on starting this until COVID-19 cases have reduced. For now, she will continue her daily yoga practice (she performed 2 hours today and does so regularly), which has been shown to help low back pain and which helps her.  -continue tramadol  -continue physical therapy -continue routine UDS monitoring.  -Discussed Qutenza  as an option for neuropathic pain control. Discussed that this is a capsaicin  patch, stronger than capsaicin  cream. Discussed that it is currently approved for diabetic peripheral neuropathy and post-herpetic neuralgia, but that it has also shown benefit in treating other forms of neuropathy. Provided patient with link to site to learn more about the patch: [email or web address].qutenza .[email or web address]/. Discussed that the patch would be placed in office and benefits usually last 3 months. Discussed that unintended exposure to capsaicin  can cause severe irritation of eyes, mucous membranes, respiratory tract, and skin, but that Qutenza  is a local treatment and does not have the systemic side effects of other nerve medications. Discussed that there may be pain, itching, erythema, and decreased sensory function associated with the application of Qutenza . Side effects usually subside within 1 week. A cold pack of analgesic medications can help with these side effects. Blood pressure can also be  increased due to pain associated with administration of the patch.  2 patches of Qutenza  was applied to the area of pain. Ice packs were applied during the procedure to ensure patient comfort. Blood pressure was monitored every 15 minutes. The patient tolerated the procedure well. Post-procedure instructions were given and follow-up has been scheduled.   -Discussed current symptoms of pain and history of pain.  -Discussed benefits of exercise in reducing pain. -Discussed following foods that may reduce pain: 1) Ginger (especially studied for arthritis)- reduce leukotriene production to decrease inflammation 2) Blueberries- high in phytonutrients that decrease inflammation 3) Salmon- marine omega-3s reduce joint swelling and pain 4) Pumpkin seeds- reduce inflammation 5) dark chocolate- reduces inflammation 6) turmeric- reduces inflammation 7) tart cherries - reduce pain and stiffness 8) extra virgin olive oil - its compound olecanthal helps to block prostaglandins  9) chili peppers- can be eaten or applied topically via capsaicin  10) mint- helpful for headache, muscle aches, joint pain, and itching 11) garlic- reduces inflammation  Link to further information on diet for chronic pain: [email or web address].bray.com/   -Good benefit from left thoracic paraspinal trigger point injections in the past  -She had good benefit from radiofrequency nerve ablation with Dr.  Kirsteins of left L5 dorsal ramus and left L3 and L4 medial branches. We educated her that this would not help with her leg pain and now she would like to address this. Has also had temporary relief from left L5 and S1 ESI and full relief since last Qutenza  administration.    -continue Robaxin  to 750mg  TID and Tramadol  50mg  TID PRN which are working for her and which she is tolerating without side effects, on an as needed basis.   4) Fibromyalgia with myalgias, worst in left  buttock currently -refilled tramadol  and robaxin  -continue yoga -discussed benefits of red light therapy -Patient's symptoms have greatly improved with her change in job, allowing her to sleep better. Continue daily yoga and meditation for stress relief. Discussed the goal of increasing her aerobic exercise to 15 minutes of daily outdoor walking for now.  -continue dry needling -Recommended continuing NAC (N-Acetyl cysteine) 600mg  BID for her fatigue. Discussed its benefits in boosting glutathione and mitochondrial function. -continue fibromyalgia relief journal -Provided with a pain relief journal and discussed that it contains foods and lifestyle tips to naturally help to improve pain. Discussed that these lifestyle strategies are also very good for health unlike some medications which can have negative side effects. Discussed that the act of keeping a journal can be therapeutic and helpful to realize patterns what helps to trigger and alleviate pain.   -recommended doTerra Deep Blue Essential oil and applied to area of pain today- discussed that this is made of natural plant oils. Shared by personal experience of benefit from use of this essential oil -provided a link to a pdf of Pete Escogue's musculoskeletal alignment exercises -continue trigger point injections every 6 weeks.  Prescribed Zynex heating/cooling blanket  Discussed current symptoms of pain and history of pain.  -Discussed benefits of exercise in reducing pain. -Discussed following foods that may reduce pain: 1) Ginger (especially studied for arthritis)- reduce leukotriene production to decrease inflammation 2) Blueberries- high in phytonutrients that decrease inflammation 3) Salmon- marine omega-3s reduce joint swelling and pain 4) Pumpkin seeds- reduce inflammation 5) dark chocolate- reduces inflammation 6) turmeric- reduces inflammation 7) tart cherries - reduce pain and stiffness 8) extra virgin olive oil - its compound  olecanthal helps to block prostaglandins  9) chili peppers- can be eaten or applied topically via capsaicin  10) mint- helpful for headache, muscle aches, joint pain, and itching 11) garlic- reduces inflammation  Link to further information on diet for chronic pain: http://www.[email or web address]    5) Osteopenia: Counseled regarding supplements, diet, and exercise to improve bone density. She is also following with rheumatology and on Fosfomax, as well as with a dietician. She has been following diet plan well. Advised regarding risk of corticosteroids when osteopenic.    6) Hypotension: BP recently 112/82. Advised that she log her BP daily and bring reads to follow-up appointment with me or PCP so medication can be appropriately adjusted.  -Advised regarding healthy foods that can help lower blood pressure and provided with a list: 1) citrus foods- high in vitamins and minerals 2) salmon and other fatty fish - reduces inflammation and oxylipins 3) swiss chard (leafy green)- high level of nitrates 4) pumpkin seeds- one of the best natural sources of magnesium  5) Beans and lentils- high in fiber, magnesium , and potassium 6) Berries- high in flavonoids 7) Amaranth (whole grain, can be cooked similarly to rice and oats)- high in magnesium  and fiber 8) Pistachios- even more effective at reducing BP than other nuts 9) Carrots-  high in phenolic compounds that relax blood vessels and reduce inflammation 10) Celery- contain phthalides that relax tissues of arterial walls 11) Tomatoes- can also improve cholesterol and reduce risk of heart disease 12) Broccoli- good source of magnesium , calcium, and potassium 13) Greek yogurt: high in potassium and calcium 14) Herbs and spices: Celery seed, cilantro, saffron, lemongrass, black cumin, ginseng, cinnamon, cardamom, sweet basil, and ginger 15) Chia and flax seeds- also help to lower cholesterol  and blood sugar 16) Beets- high levels of nitrates that relax blood vessels  17) spinach and bananas- high in potassium  -Provided lise of supplements that can help with hypertension:  1) magnesium : one high quality brand is Bioptemizers since it contains all 7 types of magnesium , otherwise over the counter magnesium  gluconate 400mg  is a good option 2) B vitamins 3) vitamin D  4) potassium 5) CoQ10 6) L-arginine 7) Vitamin C  8) Beetroot -Educated that goal BP is 120/80. -Made goal to incorporate some of the above foods into diet.    7) General health: provided dietary counseling.   --Follow up in 1 months for continued management of above conditions.    8) Thoracic myofascial pain:  -continue yoga -continue dry needling with therapy Prescribed Zynex Nexwave heating/cooling blanket    9) Overweight BMI 23.46: -Educated that current weight is 141 lbs, commended on her 14 lb weight loss! -continue fish oil -discussed that she recently had to start mirtazepine -Educated regarding health benefits of weight loss- for pain, general health, chronic disease prevention, immune health, mental health.  -Will monitor weight every visit.  -Consider Roobois tea daily.  -Discussed the benefits of intermittent fasting. -Discussed foods that can assist in weight loss: 1) leafy greens- high in fiber and nutrients 2) dark chocolate- improves metabolism (if prefer sweetened, best to sweeten with honey instead of sugar).  3) cruciferous vegetables- high in fiber and protein 4) full fat yogurt: high in healthy fat, protein, calcium, and probiotics 5) apples- high in a variety of phytochemicals 6) nuts- high in fiber and protein that increase feelings of fullness 7) grapefruit: rich in nutrients, antioxidants, and fiber (not to be taken with anticoagulation) 8) beans- high in protein and fiber 9) salmon- has high quality protein and healthy fats 10) green tea- rich in polyphenols 11) eggs- rich  in choline and vitamin D  12) tuna- high protein, boosts metabolism 13) avocado- decreases visceral abdominal fat 14) chicken (pasture raised): high in protein and iron 15) blueberries- reduce abdominal fat and cholesterol 16) whole grains- decreases calories retained during digestion, speeds metabolism 17) chia seeds- curb appetite 18) chilies- increases fat metabolism  -Discussed supplements that can be used:  1) Metatrim 400mg  BID 30 minutes before breakfast and dinner  2) Sphaeranthus indicus and Garcinia mangostana (combinations of these and #1 can be found in capsicum and zychrome  3) green coffee bean extract 400mg  twice per day or Irvingia (african mango) 150 to 300mg  twice per day.  10) Sinus infections -recommended turmeric and ginger, steam inhalation.   11) Stress: -recommended brazil nuts and saffron -discussed referral to psychology -discussed that her father has a TBI

## 2024-02-12 ENCOUNTER — Encounter: Payer: Self-pay | Admitting: Physical Medicine and Rehabilitation

## 2024-02-12 ENCOUNTER — Encounter
Payer: PRIVATE HEALTH INSURANCE | Attending: Physical Medicine and Rehabilitation | Admitting: Physical Medicine and Rehabilitation

## 2024-02-12 VITALS — BP 107/76 | HR 79 | Ht 65.0 in | Wt 153.8 lb

## 2024-02-12 DIAGNOSIS — M7918 Myalgia, other site: Secondary | ICD-10-CM | POA: Diagnosis present

## 2024-02-12 DIAGNOSIS — M545 Low back pain, unspecified: Secondary | ICD-10-CM | POA: Diagnosis not present

## 2024-02-12 DIAGNOSIS — R937 Abnormal findings on diagnostic imaging of other parts of musculoskeletal system: Secondary | ICD-10-CM | POA: Diagnosis present

## 2024-02-12 MED ORDER — TRAMADOL HCL 50 MG PO TABS: 50.0000 mg | ORAL_TABLET | Freq: Three times a day (TID) | ORAL | 3 refills | Status: AC | PRN

## 2024-02-12 MED ORDER — METHOCARBAMOL 750 MG PO TABS
750.0000 mg | ORAL_TABLET | Freq: Four times a day (QID) | ORAL | 3 refills | Status: AC | PRN
Start: 2024-02-12 — End: ?

## 2024-02-12 NOTE — Patient Instructions (Signed)
 [facility]

## 2024-02-21 ENCOUNTER — Ambulatory Visit (HOSPITAL_COMMUNITY): Payer: PRIVATE HEALTH INSURANCE | Attending: Physical Medicine and Rehabilitation

## 2024-04-01 ENCOUNTER — Emergency Department (HOSPITAL_COMMUNITY): Payer: PRIVATE HEALTH INSURANCE

## 2024-04-01 ENCOUNTER — Encounter (HOSPITAL_COMMUNITY): Payer: Self-pay | Admitting: Emergency Medicine

## 2024-04-01 ENCOUNTER — Emergency Department (HOSPITAL_COMMUNITY)
Admission: EM | Admit: 2024-04-01 | Discharge: 2024-04-01 | Disposition: A | Discharge status: Home / Self Care | Payer: PRIVATE HEALTH INSURANCE | Attending: Emergency Medicine | Admitting: Emergency Medicine

## 2024-04-01 ENCOUNTER — Other Ambulatory Visit: Payer: Self-pay

## 2024-04-01 DIAGNOSIS — I82409 Acute embolism and thrombosis of unspecified deep veins of unspecified lower extremity: Secondary | ICD-10-CM | POA: Diagnosis not present

## 2024-04-01 DIAGNOSIS — I2699 Other pulmonary embolism without acute cor pulmonale: Secondary | ICD-10-CM | POA: Insufficient documentation

## 2024-04-01 DIAGNOSIS — R0600 Dyspnea, unspecified: Secondary | ICD-10-CM | POA: Diagnosis not present

## 2024-04-01 DIAGNOSIS — Z7901 Long term (current) use of anticoagulants: Secondary | ICD-10-CM | POA: Diagnosis not present

## 2024-04-01 DIAGNOSIS — I10 Essential (primary) hypertension: Secondary | ICD-10-CM | POA: Diagnosis not present

## 2024-04-01 DIAGNOSIS — R0789 Other chest pain: Secondary | ICD-10-CM | POA: Diagnosis present

## 2024-04-01 DIAGNOSIS — Z79899 Other long term (current) drug therapy: Secondary | ICD-10-CM | POA: Insufficient documentation

## 2024-04-01 DIAGNOSIS — R079 Chest pain, unspecified: Secondary | ICD-10-CM

## 2024-04-01 DIAGNOSIS — Z91040 Latex allergy status: Secondary | ICD-10-CM | POA: Diagnosis not present

## 2024-04-01 LAB — CBC WITH DIFFERENTIAL/PLATELET
Abs Immature Granulocytes: 0.02 K/uL (ref 0.00–0.07)
Basophils Absolute: 0 K/uL (ref 0.0–0.1)
Basophils Relative: 1 %
Eosinophils Absolute: 0.1 K/uL (ref 0.0–0.5)
Eosinophils Relative: 1 %
HCT: 44.1 % (ref 36.0–46.0)
Hemoglobin: 14.7 g/dL (ref 12.0–15.0)
Immature Granulocytes: 0 %
Lymphocytes Relative: 28 %
Lymphs Abs: 2.2 K/uL (ref 0.7–4.0)
MCH: 32 pg (ref 26.0–34.0)
MCHC: 33.3 g/dL (ref 30.0–36.0)
MCV: 95.9 fL (ref 80.0–100.0)
Monocytes Absolute: 0.6 K/uL (ref 0.1–1.0)
Monocytes Relative: 8 %
Neutro Abs: 4.8 K/uL (ref 1.7–7.7)
Neutrophils Relative %: 62 %
Platelets: 289 K/uL (ref 150–400)
RBC: 4.6 MIL/uL (ref 3.87–5.11)
RDW: 12.5 % (ref 11.5–15.5)
WBC: 7.7 K/uL (ref 4.0–10.5)
nRBC: 0 % (ref 0.0–0.2)

## 2024-04-01 LAB — COMPREHENSIVE METABOLIC PANEL WITH GFR
ALT: 21 U/L (ref 0–44)
AST: 27 U/L (ref 15–41)
Albumin: 4.7 g/dL (ref 3.5–5.0)
Alkaline Phosphatase: 63 U/L (ref 38–126)
Anion gap: 10 (ref 5–15)
BUN: 18 mg/dL (ref 8–23)
CO2: 28 mmol/L (ref 22–32)
Calcium: 10.6 mg/dL — ABNORMAL HIGH (ref 8.9–10.3)
Chloride: 100 mmol/L (ref 98–111)
Creatinine, Ser: 0.98 mg/dL (ref 0.44–1.00)
GFR, Estimated: >60 mL/min (ref >60)
Glucose, Bld: 98 mg/dL (ref 70–99)
Potassium: 3.8 mmol/L (ref 3.5–5.1)
Sodium: 137 mmol/L (ref 135–145)
Total Bilirubin: 0.3 mg/dL (ref 0.0–1.2)
Total Protein: 7.5 g/dL (ref 6.5–8.1)

## 2024-04-01 LAB — RESP PANEL BY RT-PCR (RSV, FLU A&B, COVID)  RVPGX2
Influenza A by PCR: NEGATIVE
Influenza B by PCR: NEGATIVE
Resp Syncytial Virus by PCR: NEGATIVE
SARS Coronavirus 2 by RT PCR: NEGATIVE

## 2024-04-01 LAB — TROPONIN T, HIGH SENSITIVITY
Troponin T High Sensitivity: <15 ng/L (ref 0–19)
Troponin T High Sensitivity: <15 ng/L (ref 0–19)

## 2024-04-01 LAB — PRO BRAIN NATRIURETIC PEPTIDE: Pro Brain Natriuretic Peptide: <50 pg/mL (ref <300.0)

## 2024-04-01 MED ORDER — ALBUTEROL SULFATE HFA 108 (90 BASE) MCG/ACT IN AERS: 1.0000 | INHALATION_SPRAY | Freq: Four times a day (QID) | RESPIRATORY_TRACT | 0 refills | Status: AC | PRN

## 2024-04-01 MED ORDER — SODIUM CHLORIDE 0.9 % IV BOLUS
1000.0000 mL | Freq: Once | INTRAVENOUS | Status: AC
  Administered 2024-04-01: 1000 mL via INTRAVENOUS

## 2024-04-01 MED ORDER — IPRATROPIUM-ALBUTEROL 0.5-2.5 (3) MG/3ML IN SOLN
3.0000 mL | Freq: Once | RESPIRATORY_TRACT | Status: AC
  Administered 2024-04-01: 3 mL via RESPIRATORY_TRACT
  Filled 2024-04-01: qty 3

## 2024-04-01 MED ORDER — IOHEXOL 350 MG/ML SOLN
75.0000 mL | Freq: Once | INTRAVENOUS | Status: AC | PRN
  Administered 2024-04-01: 75 mL via INTRAVENOUS

## 2024-04-01 NOTE — ED Triage Notes (Addendum)
 Pt reports progressive SHOB and a tight feeling in her upper chest. Pt reports hx of PE. Pt taking Eliquis .

## 2024-04-01 NOTE — Discharge Instructions (Signed)
It was a pleasure caring for you today in the emergency department.  Please follow up with your PCP for recheck  Please return to the emergency department for any worsening or worrisome symptoms.

## 2024-04-01 NOTE — ED Provider Notes (Signed)
 [facility] AT [facility] Provider Note  CSN: [id number] Arrival date & time: 04/01/24 1009  Chief Complaint(s) Shortness of Breath  HPI Faith Thomas is a 63 y.o. female with past medical history as below, significant for DVT, PE on Xarelto, sleep apnea, GAD, MDD, fibromyalgia who presents to the ED with complaint of cp/dib  Patient reports has been feeling unwell for the past few days, thought it was her seasonal allergies but then it rained and she thought all the allergies would have been washed away and she was still having chest tightness, dyspnea, cough.  Symptoms not provoked by exertion or improved by rest.  She reports they are present constantly.  Seem to worsen today.  Has a tightness sensation to her upper chest and sensation that something is stuck there that she cannot expectorate.  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   Hypertension    Osteoporosis    PE (pulmonary thromboembolism) (HCC)    Positive TB test    Raynaud disease    Scoliosis    Sleep apnea    Urine incontinence    Patient Active Problem List   Diagnosis Date Noted   Generalized anxiety disorder 03/31/2018   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 Authorizing Provider  albuterol  (VENTOLIN  HFA) 108 (90 Base) MCG/ACT inhaler Inhale 1-2 puffs into the lungs every 6 (six) hours as needed for wheezing or shortness of breath. 04/01/24  Yes Elnor Savant A, DO  acetaminophen  (TYLENOL ) 500 MG tablet Take 1,000 mg by mouth every 8 (eight) hours as needed.     [provider]  alendronate (FOSAMAX) 70 MG tablet Take by mouth. 11/18/21   [provider]  ALPRAZolam  (XANAX ) 0.5 MG tablet Take 1.5 tabs po QHS and 1/2 tablet twice daily as needed 02/17/23   Franchot Harlene SQUIBB, PMHNP  apixaban  (ELIQUIS ) 5 MG TABS tablet Take 1 tablet (5 mg total) by mouth 2 (two) times daily. 01/24/18   Luke Chiquita SAUNDERS, DO  b complex vitamins capsule Take 1 capsule by mouth daily.    [provider]  busPIRone  (BUSPAR ) 30 MG tablet Take 1 tablet (30 mg total) by mouth 2 (two) times daily. 02/26/23   Franchot Harlene SQUIBB, PMHNP  celecoxib (CELEBREX) 100 MG capsule Take by mouth. 12/30/21   [provider]  cetirizine (ZYRTEC) 10 MG tablet Take 10 mg by mouth daily as needed for allergies.    [provider]  diclofenac  Sodium (VOLTAREN  ARTHRITIS PAIN) 1 % GEL Apply 2 g topically 4 (four) times daily. 09/19/22   Raulkar, Sven SQUIBB, MD  estradiol (ESTRACE) 0.1 MG/GM vaginal cream  Place vaginally. 01/10/22   [provider]  Estradiol 10 MCG TABS vaginal tablet Place 1 tablet vaginally 2 (two) times a week. 02/23/20   [provider]  fluticasone (FLONASE) 50 MCG/ACT nasal spray Place into both nostrils daily as needed.    [provider]  L-Methylfolate 15 MG TABS Take 1 tablet (15 mg total) by mouth daily. 08/21/22   Franchot Harlene SQUIBB, PMHNP  lisinopril  (ZESTRIL ) 10 MG tablet Take 1 tablet by mouth daily. 09/28/21   [provider]  Melatonin 3 MG CAPS Take by mouth.    [provider]   methocarbamol  (ROBAXIN ) 750 MG tablet Take 1 tablet (750 mg total) by mouth every 6 (six) hours as needed for muscle spasms. 02/12/24   Raulkar, Sven SQUIBB, MD  mirtazapine  (REMERON ) 15 MG tablet Take 1 tablet (15 mg total) by mouth at bedtime. 02/26/23   Franchot Harlene SQUIBB, PMHNP  Multiple Vitamin (MULTIVITAMIN) tablet Take 1 tablet by mouth daily.    [provider]  Omega-3 Fatty Acids (OMEGA 3 PO) Take by mouth.    [provider]  Probiotic Product (PROBIOTIC PO) Take 1 capsule by mouth daily.     [provider]  propranolol  (INDERAL ) 10 MG tablet Take 1 tablet (10 mg total) by mouth 2 (two) times daily as needed (Anxiety). 08/21/22   Franchot Harlene SQUIBB, PMHNP  sertraline  (ZOLOFT ) 100 MG tablet TAKE 2 TABLETS(200 MG) BY MOUTH AT BEDTIME 02/26/23   Franchot Harlene SQUIBB, PMHNP  traMADol  (ULTRAM ) 50 MG tablet Take 1 tablet (50 mg total) by mouth 3 (three) times daily as needed. 02/12/24   Raulkar, Krutika P, MD  traZODone  (DESYREL ) 100 MG tablet Take 1/2-1 tablet po QHS prn insomnia 02/26/23   Franchot Harlene SQUIBB, PMHNP  vitamin C  (ASCORBIC ACID ) 500 MG tablet Take by oral route.    [provider]  Zinc Sulfate (ZINC 15 PO) Take by mouth.    [provider]                                                                                                                                    Past Surgical History History reviewed. No pertinent surgical history. Family History Family History  Problem Relation Age of Onset   Arthritis Mother    Hypertension Mother    Mental illness Mother    Anxiety disorder Mother    Depression Mother    Arthritis Father    Hypertension Father    Mental illness Father    Anxiety disorder Father    Depression Father    Mental illness Brother    ADD / ADHD Brother    Psychosis Brother    Post-traumatic stress disorder Brother    Suicidality Brother    Cancer Maternal Aunt    Autism Maternal Aunt    Depression Maternal Aunt     Arthritis Maternal Grandfather    Hypertension Maternal  Grandmother    Depression Cousin    Anxiety disorder Cousin    Arthritis Daughter    Autism Daughter     Social History Social History   Tobacco Use   Smoking status: Never   Smokeless tobacco: Never  Vaping Use   Vaping status: Never Used  Substance Use Topics   Alcohol use: Yes    Alcohol/week: 0.0 standard drinks of alcohol    Comment: occasional    Drug use: No   Allergies Citalopram, Escitalopram, Latex, Penicillins, Bee venom, Duloxetine hcl, Escitalopram oxalate, Other, Penicillin g, Progesterone, Vortioxetine , Bupropion , Duloxetine, Gabapentin , and Levofloxacin   Review of Systems A thorough review of systems was obtained and all systems are negative except as noted in the HPI and PMH.   Physical Exam Vital Signs  I have reviewed the triage vital signs BP (!) 142/116   Pulse 84   Temp (!) 97.5 F (36.4 C) (Oral)   Resp 18   SpO2 94%  Physical Exam Vitals and nursing note reviewed.  Constitutional:      General: She is not in acute distress.    Appearance: Normal appearance.  HENT:     Head: Normocephalic and atraumatic.     Right Ear: External ear normal.     Left Ear: External ear normal.     Nose: Nose normal.     Mouth/Throat:     Mouth: Mucous membranes are moist.  Eyes:     General: No scleral icterus.       Right eye: No discharge.        Left eye: No discharge.  Cardiovascular:     Rate and Rhythm: Normal rate and regular rhythm.     Pulses: Normal pulses.     Heart sounds: Normal heart sounds.  Pulmonary:     Effort: Pulmonary effort is normal. No respiratory distress.     Breath sounds: Normal breath sounds. No stridor. No wheezing.  Abdominal:     General: Abdomen is flat. There is no distension.     Palpations: Abdomen is soft.     Tenderness: There is no abdominal tenderness.  Musculoskeletal:     Cervical back: No rigidity.     Right lower leg: No edema.     Left lower  leg: No edema.  Skin:    General: Skin is warm and dry.     Capillary Refill: Capillary refill takes less than 2 seconds.  Neurological:     Mental Status: She is alert.  Psychiatric:        Mood and Affect: Mood normal.        Behavior: Behavior normal. Behavior is cooperative.     ED Results and Treatments Labs (all labs ordered are listed, but only abnormal results are displayed) Labs Reviewed  COMPREHENSIVE METABOLIC PANEL WITH GFR - Abnormal; Notable for the following components:      Result Value   Calcium 10.6 (*)    All other components within normal limits  RESP PANEL BY RT-PCR (RSV, FLU A&B, COVID)  RVPGX2  PRO BRAIN NATRIURETIC PEPTIDE  CBC WITH DIFFERENTIAL/PLATELET  TROPONIN T, HIGH SENSITIVITY  TROPONIN T, HIGH SENSITIVITY  Radiology CT Angio Chest PE W and/or Wo Contrast Result Date: 04/01/2024 CLINICAL DATA:  Cough and shortness of breath 2 weeks. High probability for pulmonary embolism. Patient on blood thinners since 2017. EXAM: CT ANGIOGRAPHY CHEST WITH CONTRAST TECHNIQUE: Multidetector CT imaging of the chest was performed using the standard protocol during bolus administration of intravenous contrast. Multiplanar CT image reconstructions and MIPs were obtained to evaluate the vascular anatomy. RADIATION DOSE REDUCTION: This exam was performed according to the departmental dose-optimization program which includes automated exposure control, adjustment of the mA and/or kV according to patient size and/or use of iterative reconstruction technique. CONTRAST:  75mL OMNIPAQUE  IOHEXOL  350 MG/ML SOLN COMPARISON:  07/22/2021 FINDINGS: Cardiovascular: Heart is normal size. Thoracic aorta is normal in caliber. Pulmonary arterial system is well opacified without evidence of emboli. Remaining vascular structures are normal. Mediastinum/Nodes: No mediastinal or  hilar adenopathy. Remaining mediastinal structures are normal. Lungs/Pleura: Lungs are adequately inflated without acute airspace process or effusion. Airways are normal. Upper Abdomen: No acute findings in the visualized upper abdomen. Musculoskeletal: No focal abnormality. Review of the MIP images confirms the above findings. IMPRESSION: No acute cardiopulmonary disease and no evidence of pulmonary embolism. Electronically Signed   By: Toribio Agreste M.D.   On: 04/01/2024 13:43   DG Chest Port 1 View Result Date: 04/01/2024 EXAM: 1 VIEW(S) XRAY OF THE CHEST 04/01/2024 10:55:18 AM COMPARISON: 07/22/2021 CLINICAL HISTORY: Pt reports progressive SHOB and a tight feeling in her upper chest. Pt reports hx of PE. FINDINGS: LUNGS AND PLEURA: No focal pulmonary opacity. No pulmonary edema. No pleural effusion. No pneumothorax. HEART AND MEDIASTINUM: No acute abnormality of the cardiac and mediastinal silhouettes. BONES AND SOFT TISSUES: No acute osseous abnormality. IMPRESSION: 1. No acute cardiopulmonary process. Electronically signed by: Norleen Kil MD 04/01/2024 11:04 AM EDT RP Workstation: [id number]    Pertinent labs & imaging results that were available during my care of the patient were reviewed by me and considered in my medical decision making (see MDM for details).  Medications Ordered in ED Medications  ipratropium-albuterol  (DUONEB) 0.5-2.5 (3) MG/3ML nebulizer solution 3 mL (3 mLs Nebulization Given 04/01/24 1129)  sodium chloride  0.9 % bolus 1,000 mL (0 mLs Intravenous Stopped 04/01/24 1254)  iohexol  (OMNIPAQUE ) 350 MG/ML injection 75 mL (75 mLs Intravenous Contrast Given 04/01/24 1307)                                                                                                                                     Procedures Procedures  (including critical care time)  Medical Decision Making / ED Course    Medical Decision Making:    Isabel Ardila is a 63 y.o. female  with  past medical history as below, significant for DVT, PE on Xarelto, sleep apnea, GAD, MDD, fibromyalgia who presents to the ED with complaint of cp/dib. The complaint involves an extensive differential diagnosis and also carries with it a  high risk of complications and morbidity.  Serious etiology was considered. Ddx includes but is not limited to: Differential includes all life-threatening causes for chest pain. This includes but is not exclusive to acute coronary syndrome, aortic dissection, pulmonary embolism, cardiac tamponade, community-acquired pneumonia, pericarditis, musculoskeletal chest wall pain, etc.   Complete initial physical exam performed, notably the patient was in acute distress, HDS.    Reviewed and confirmed nursing documentation for past medical history, family history, social history.  Vital signs reviewed.    Chest pain / dyspnea History of VTE > - Patient with URI type symptoms, chest tightness.  HDS, no hypoxia, no tachycardia.  No leg swelling, she reports no missed doses of her Xarelto - labs/imaging are stable - feeling better after NBT and fluids - no hypoxia  The patient's chest pain is not suggestive of cardiac ischemia, aortic dissection, pericarditis, myocarditis, pulmonary embolism, pneumothorax, pneumonia, Zoster, or esophageal perforation, or other serious etiology.  Historically not abrupt in onset, tearing or ripping, pulses symmetric. EKG nonspecific for ischemia/infarction. No dysrhythmias, brugada, WPW, prolonged QT noted.   Troponin negative x2. CXR reviewed. Labs without demonstration of acute pathology unless otherwise noted above. Low HEART Score: 0-3 points (0.9-1.7% risk of MACE).  CTPE is neg  Pt feeling better, will start on albuterol  for home as this seems to have helped with her symptoms. Workup not c/w acute ischemia, acute vascular pathology, acute cardio/thoracic pathology.  I have discussed the diagnosis/risks/treatment options with the  patient.  Evaluation and diagnostic testing in the emergency department does not suggest an emergent condition requiring admission or immediate intervention beyond what has been performed at this time.  They will follow up with PCP. We also discussed returning to the ED immediately if new or worsening sx occur. We discussed the sx which are most concerning (e.g., sudden worsening pain, fever, inability to tolerate by mouth, cp, dib) that necessitate immediate return.    The patient appears reasonably screened and/or stabilized for discharge and I doubt any other medical condition or other [facility] requiring further screening, evaluation, or treatment in the ED at this time prior to discharge.                          Additional history obtained: -Additional history obtained from na -External records from outside source obtained and reviewed including: Chart review including previous notes, labs, imaging, consultation notes including  Prior labs Primary care documentation   Lab Tests: -I ordered, reviewed, and interpreted labs.   The pertinent results include:   Labs Reviewed  COMPREHENSIVE METABOLIC PANEL WITH GFR - Abnormal; Notable for the following components:      Result Value   Calcium 10.6 (*)    All other components within normal limits  RESP PANEL BY RT-PCR (RSV, FLU A&B, COVID)  RVPGX2  PRO BRAIN NATRIURETIC PEPTIDE  CBC WITH DIFFERENTIAL/PLATELET  TROPONIN T, HIGH SENSITIVITY  TROPONIN T, HIGH SENSITIVITY    Notable for labs stable  EKG   EKG Interpretation Date/Time:  Tuesday April 01 2024 10:20:46 EDT Ventricular Rate:  75 PR Interval:  146 QRS Duration:  139 QT Interval:  399 QTC Calculation: 446 R Axis:   111  Text Interpretation: Sinus rhythm Right bundle branch block Probable anteroseptal infarct, old similar to prior Confirmed by Elnor Savant ([id number]) on 04/01/2024 10:44:07 AM         Imaging Studies ordered: I ordered imaging studies  including CXR, ctpe I independently visualized  the following imaging with scope of interpretation limited to determining acute life threatening conditions related to emergency care; findings noted above I agree with the radiologist interpretation If any imaging was obtained with contrast I closely monitored patient for any possible adverse reaction a/w contrast administration in the emergency department   Medicines ordered and prescription drug management: Meds ordered this encounter  Medications   ipratropium-albuterol  (DUONEB) 0.5-2.5 (3) MG/3ML nebulizer solution 3 mL   sodium chloride  0.9 % bolus 1,000 mL   iohexol  (OMNIPAQUE ) 350 MG/ML injection 75 mL   albuterol  (VENTOLIN  HFA) 108 (90 Base) MCG/ACT inhaler    Sig: Inhale 1-2 puffs into the lungs every 6 (six) hours as needed for wheezing or shortness of breath.    Dispense:  1 each    Refill:  0    -I have reviewed the patients home medicines and have made adjustments as needed   Consultations Obtained: na   Cardiac Monitoring: The patient was maintained on a cardiac monitor.  I personally viewed and interpreted the cardiac monitored which showed an underlying rhythm of: nsr Continuous pulse oximetry interpreted by myself, 99% on RA.    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    Final Clinical Impression(s) / ED Diagnoses Final diagnoses:  Dyspnea, unspecified type  Chest pain with low risk of acute coronary syndrome        Elnor Jayson LABOR, DO 04/02/24 [phone]

## 2024-04-01 NOTE — ED Notes (Signed)
 Pt pulled out IV

## 2024-04-01 NOTE — ED Notes (Signed)
 Patient ambulated up and down the hallway steady and pulse stayed consistent at 90. O2 was 100%. When we came back her bp was 161/101 but before then was 142/89. She stated that she didn't feel dizzy or anything, just the chest discomfort. She said her BP at the doctors was 101/75 and the bottom number usually stays in the 70s so she is concerned. - Infant Doane NT

## 2024-07-05 ENCOUNTER — Other Ambulatory Visit: Payer: Self-pay

## 2024-07-05 ENCOUNTER — Emergency Department
Admission: EM | Admit: 2024-07-05 | Discharge: 2024-07-05 | Disposition: A | Discharge status: Home / Self Care | Payer: Self-pay | Attending: Emergency Medicine | Admitting: Emergency Medicine

## 2024-07-05 ENCOUNTER — Emergency Department: Payer: Self-pay

## 2024-07-05 DIAGNOSIS — S52612A Displaced fracture of left ulna styloid process, initial encounter for closed fracture: Secondary | ICD-10-CM | POA: Insufficient documentation

## 2024-07-05 DIAGNOSIS — W000XXA Fall on same level due to ice and snow, initial encounter: Secondary | ICD-10-CM | POA: Insufficient documentation

## 2024-07-05 DIAGNOSIS — S52532A Colles' fracture of left radius, initial encounter for closed fracture: Secondary | ICD-10-CM | POA: Insufficient documentation

## 2024-07-05 MED ORDER — TRAMADOL HCL 50 MG PO TABS
50.0000 mg | ORAL_TABLET | Freq: Once | ORAL | Status: AC
  Administered 2024-07-05: 50 mg via ORAL
  Filled 2024-07-05: qty 1

## 2024-07-05 MED ORDER — OXYCODONE-ACETAMINOPHEN 5-325 MG PO TABS
1.0000 | ORAL_TABLET | Freq: Once | ORAL | Status: AC
  Administered 2024-07-05: 1 via ORAL
  Filled 2024-07-05: qty 1

## 2024-07-05 MED ORDER — LORAZEPAM 1 MG PO TABS
1.0000 mg | ORAL_TABLET | Freq: Once | ORAL | Status: AC
  Administered 2024-07-05: 1 mg via ORAL
  Filled 2024-07-05: qty 1

## 2024-07-05 MED ORDER — ACETAMINOPHEN 325 MG PO TABS
650.0000 mg | ORAL_TABLET | Freq: Once | ORAL | Status: AC
  Administered 2024-07-05: 650 mg via ORAL
  Filled 2024-07-05: qty 2

## 2024-07-05 MED ORDER — LIDOCAINE HCL (PF) 1 % IJ SOLN
10.0000 mL | Freq: Once | INTRAMUSCULAR | Status: DC
  Filled 2024-07-05: qty 10

## 2024-07-05 MED ORDER — IBUPROFEN 800 MG PO TABS
800.0000 mg | ORAL_TABLET | Freq: Once | ORAL | Status: AC
  Administered 2024-07-05: 800 mg via ORAL
  Filled 2024-07-05: qty 1

## 2024-07-05 NOTE — ED Notes (Signed)
 Patient complaining of splint being too tight. This RN called a NT to change her splint. Patient very grateful towards this RN and NT. PA in room assessing new splint placement.

## 2024-07-05 NOTE — Discharge Instructions (Addendum)
 X Ray Left Wrist (before reduction) IMPRESSION: 1. Comminuted intra-articular distal left radial fracture, with impaction and dorsal angulation at the fracture site. 2. Minimally displaced ulnar styloid fracture. 3. Diffuse soft tissue swelling.   Please read through the included information about splint care (keep it clean and dry).  If your pain becomes much more severe, the splint becomes too tight, or you feel as if your injured limb is becoming numb or cold, please return immediately to the Emergency Department.  Follow up with the orthopedics specialist listed in this paperwork (Dr. Ezra).  When possible, keep your splint elevated to help with the swelling.  You may also use ice packs over the splint. You may take Tylenol  and Ibuprofen  for pain at home. Also continue your regular tramadol  use at home for pain.

## 2024-07-05 NOTE — ED Triage Notes (Signed)
 Pt to ED POV for L wrist injury from slipping today on ice. Wrist is visibly deformed. No other injuries. Has ice pack on wrist currently. Able to move fingers.

## 2024-07-05 NOTE — ED Provider Notes (Signed)
 "  [facility] Provider Note    Event Date/Time   First MD Initiated Contact with Patient 07/05/24 1703     (approximate)   History   Wrist Injury   HPI  Faith Thomas is a 64 y.o. female  with a past medical history of anxiety, fibromyalgia, chronic fatigue, MDD, GERD, GAD, dysthymic disorder presents to the emergency department with left wrist injury after slipping on ice today while outside, landed in a FOOSH mechanism.  She placed an ice pack on her wrist immediately afterwards.  She denies any other injuries or head injury.  She is able to move her fingers.  She endorses she can feel all of her fingers at this time. Takes tramadol  regularly.   Physical Exam   Triage Vital Signs: ED Triage Vitals  Encounter Vitals Group     BP 07/05/24 1543 (!) 137/96     Girls Systolic BP Percentile --      Girls Diastolic BP Percentile --      Boys Systolic BP Percentile --      Boys Diastolic BP Percentile --      Pulse Rate 07/05/24 1543 71     Resp 07/05/24 1542 20     Temp 07/05/24 1543 97.7 F (36.5 C)     Temp Source 07/05/24 1543 Oral     SpO2 07/05/24 1543 98 %     Weight 07/05/24 1546 160 lb (72.6 kg)     Height 07/05/24 1546 5' 6 (1.676 m)     Head Circumference --      Peak Flow --      Pain Score 07/05/24 1543 5     Pain Loc --      Pain Education --      Exclude from Growth Chart --     Most recent vital signs: Vitals:   07/05/24 1542 07/05/24 1543  BP:  (!) 137/96  Pulse:  71  Resp: 20   Temp:  97.7 F (36.5 C)  SpO2:  98%    General: Awake, in no acute distress. Appears stated age. Head: Normocephalic, atraumatic. CV: Good peripheral perfusion.  Radial pulses 2+ bilaterally, cap refill less than 2 seconds. Respiratory:Normal respiratory effort.  No respiratory distress.  GI: Soft, non-distended.  MSK: Normal ROM and  5/5 strength in RUE, left shoulder and elbow. Limited left wrist movement due to pain.  Obvious deformity  noted on exam, tender to palpation all throughout the left wrist.  No open wound. Skin:Warm, dry, intact.  Neurological: A&Ox4 to person, place, time, and situation.   ED Results / Procedures / Treatments   Labs (all labs ordered are listed, but only abnormal results are displayed) Labs Reviewed - No data to display   EKG   RADIOLOGY Left wrist FINDINGS: Frontal, oblique, lateral, and ulnar deviated views of the left wrist are obtained. There is a comminuted intra-articular distal left radial fracture, with marked impaction and dorsal angulation at the fracture site. The radiocarpal joint appears intact. Minimally displaced ulnar styloid fracture is also noted. No other acute bony abnormalities. Joint spaces are relatively well preserved. Diffuse soft tissue swelling.   IMPRESSION: 1. Comminuted intra-articular distal left radial fracture, with impaction and dorsal angulation at the fracture site. 2. Minimally displaced ulnar styloid fracture. 3. Diffuse soft tissue swelling.  Post reduction left wrist IMPRESSION: 1. Improved alignment post reduction of the distal radius fracture. 2. Minimally displaced ulnar styloid fracture.   PROCEDURES:  Critical Care performed:  No   Procedures   MEDICATIONS ORDERED IN ED: Medications  lidocaine  (PF) (XYLOCAINE ) 1 % injection 10 mL (has no administration in time range)  traMADol  (ULTRAM ) tablet 50 mg (has no administration in time range)  LORazepam  (ATIVAN ) tablet 1 mg (1 mg Oral Given 07/05/24 1741)  oxyCODONE -acetaminophen  (PERCOCET/ROXICET) 5-325 MG per tablet 1 tablet (1 tablet Oral Given 07/05/24 1742)  acetaminophen  (TYLENOL ) tablet 650 mg (650 mg Oral Given 07/05/24 1739)  ibuprofen  (ADVIL ) tablet 800 mg (800 mg Oral Given 07/05/24 1741)     IMPRESSION / MDM / ASSESSMENT AND PLAN / ED COURSE  I reviewed the triage vital signs and the nursing notes.                              Differential diagnosis includes, but is  not limited to, left radial fracture, left ulnar fracture, wrist dislocation, wrist sprain  Patient's presentation is most consistent with acute complicated illness / injury requiring diagnostic workup.  Patient is a 64 year old female presenting with left wrist injury after mechanical fall today while slipping on ice.  She is neurovascularly intact.  X-ray of left wrist ordered in triage.  I independently viewed the x-ray and radiologist's report.  I agree with the radiologist's report that there is an acute comminuted intra-articular distal left radial fracture with dorsal angulation as well as a minimally displaced ulnar styloid fracture of the left wrist.  Given acute pain medication while in the ER.  Patient was placed in finger traps with weights to prep for left wrist reduction.  Hematoma block was completed, lidocaine  10 mL without epi used followed by wrist reduction and placement in sugar-tong splint.  X ray post-reduction obtained and shows improved alignment.  Splint checked prior to discharge.  Splint care instructions discussed with the patient. Discussed patient with ortho on call Dr. Tobie and he recommended no CT at this time and to have patient follow-up with ortho hand specialist outpatient following today's visit.   Patient is already on tramadol  regularly for low back pain but is only taking 1 dose daily rather than the recommended TID; she has 2 months worth of medication at home.  Told her to use these in addition to Tylenol  and ibuprofen  for pain, discussed ice packs over the splint.  The patient may return to the emergency department for any new, worsening, or concerning symptoms. Patient was given the opportunity to ask questions; all questions were answered. Emergency department return precautions were discussed with the patient.  Patient is in agreement to the treatment plan.  Patient is stable for discharge.    FINAL CLINICAL IMPRESSION(S) / ED DIAGNOSES   Final diagnoses:   Traumatic closed fracture of ulnar styloid with minimal displacement, left, initial encounter  Closed Colles' fracture of left radius, initial encounter     Rx / DC Orders   ED Discharge Orders     None        Note:  This document was prepared using Dragon voice recognition software and may include unintentional dictation errors.     Sheron Galva, PA-C 07/05/24 2007  "

## 2024-07-10 ENCOUNTER — Other Ambulatory Visit: Payer: Self-pay

## 2024-07-14 ENCOUNTER — Inpatient Hospital Stay: Admission: RE | Admit: 2024-07-14 | Payer: Self-pay | Source: Ambulatory Visit

## 2024-07-15 ENCOUNTER — Ambulatory Visit: Admission: RE | Admit: 2024-07-15 | Payer: Self-pay

## 2024-07-15 ENCOUNTER — Encounter: Admission: RE | Payer: Self-pay

## 2024-08-11 ENCOUNTER — Ambulatory Visit: Payer: PRIVATE HEALTH INSURANCE | Admitting: Physical Medicine and Rehabilitation
